# Patient Record
Sex: Male | Born: 1970 | Race: White | Hispanic: No | Smoking: Current every day smoker
Health system: Southern US, Community
[De-identification: ages and names within clinical notes are randomized; demographics above are authoritative.]

## PROBLEM LIST (undated history)

## (undated) DIAGNOSIS — F419 Anxiety disorder, unspecified: Secondary | ICD-10-CM

## (undated) DIAGNOSIS — D72821 Monocytosis (symptomatic): Secondary | ICD-10-CM

## (undated) DIAGNOSIS — F1010 Alcohol abuse, uncomplicated: Secondary | ICD-10-CM

## (undated) DIAGNOSIS — K219 Gastro-esophageal reflux disease without esophagitis: Secondary | ICD-10-CM

## (undated) DIAGNOSIS — E785 Hyperlipidemia, unspecified: Secondary | ICD-10-CM

## (undated) HISTORY — DX: Alcohol abuse, uncomplicated: F10.10

## (undated) HISTORY — DX: Hyperlipidemia, unspecified: E78.5

## (undated) HISTORY — DX: Monocytosis (symptomatic): D72.821

## (undated) HISTORY — DX: Anxiety disorder, unspecified: F41.9

---

## 1998-02-26 HISTORY — PX: SPLENECTOMY, TOTAL: SHX788

## 1998-04-27 DIAGNOSIS — K219 Gastro-esophageal reflux disease without esophagitis: Secondary | ICD-10-CM

## 2004-08-26 DIAGNOSIS — I10 Essential (primary) hypertension: Secondary | ICD-10-CM | POA: Insufficient documentation

## 2004-09-08 ENCOUNTER — Ambulatory Visit: Payer: Self-pay | Admitting: Family Medicine

## 2004-09-20 ENCOUNTER — Ambulatory Visit: Payer: Self-pay | Admitting: Family Medicine

## 2004-09-22 ENCOUNTER — Ambulatory Visit: Payer: Self-pay | Admitting: Family Medicine

## 2004-10-09 ENCOUNTER — Ambulatory Visit: Payer: Self-pay | Admitting: Family Medicine

## 2004-10-17 ENCOUNTER — Ambulatory Visit: Payer: Self-pay | Admitting: Family Medicine

## 2004-10-31 ENCOUNTER — Ambulatory Visit: Payer: Self-pay | Admitting: Family Medicine

## 2005-02-20 ENCOUNTER — Ambulatory Visit: Payer: Self-pay | Admitting: Family Medicine

## 2005-03-26 ENCOUNTER — Ambulatory Visit: Payer: Self-pay | Admitting: Family Medicine

## 2005-04-30 ENCOUNTER — Ambulatory Visit: Payer: Self-pay | Admitting: Family Medicine

## 2005-09-24 ENCOUNTER — Ambulatory Visit: Payer: Self-pay | Admitting: Family Medicine

## 2005-10-16 ENCOUNTER — Ambulatory Visit: Payer: Self-pay | Admitting: Family Medicine

## 2005-12-18 ENCOUNTER — Ambulatory Visit: Payer: Self-pay | Admitting: Family Medicine

## 2005-12-21 ENCOUNTER — Ambulatory Visit: Payer: Self-pay | Admitting: Family Medicine

## 2006-03-14 DIAGNOSIS — C4491 Basal cell carcinoma of skin, unspecified: Secondary | ICD-10-CM

## 2006-03-14 HISTORY — DX: Basal cell carcinoma of skin, unspecified: C44.91

## 2006-03-21 ENCOUNTER — Ambulatory Visit: Payer: Self-pay | Admitting: Family Medicine

## 2006-05-30 ENCOUNTER — Encounter: Payer: Self-pay | Admitting: Family Medicine

## 2006-10-08 ENCOUNTER — Telehealth (INDEPENDENT_AMBULATORY_CARE_PROVIDER_SITE_OTHER): Payer: Self-pay | Admitting: *Deleted

## 2006-11-06 ENCOUNTER — Telehealth (INDEPENDENT_AMBULATORY_CARE_PROVIDER_SITE_OTHER): Payer: Self-pay | Admitting: *Deleted

## 2006-12-02 ENCOUNTER — Telehealth (INDEPENDENT_AMBULATORY_CARE_PROVIDER_SITE_OTHER): Payer: Self-pay | Admitting: *Deleted

## 2008-04-13 ENCOUNTER — Ambulatory Visit: Payer: Self-pay | Admitting: Family Medicine

## 2008-04-13 DIAGNOSIS — M542 Cervicalgia: Secondary | ICD-10-CM | POA: Insufficient documentation

## 2008-11-16 ENCOUNTER — Ambulatory Visit: Payer: Self-pay | Admitting: Family Medicine

## 2009-01-04 ENCOUNTER — Encounter (INDEPENDENT_AMBULATORY_CARE_PROVIDER_SITE_OTHER): Payer: Self-pay | Admitting: *Deleted

## 2009-10-03 ENCOUNTER — Encounter (INDEPENDENT_AMBULATORY_CARE_PROVIDER_SITE_OTHER): Payer: Self-pay | Admitting: *Deleted

## 2010-03-28 NOTE — Letter (Signed)
Summary: Nadara Eaton letter  [facility]  [address] [place], [place] [address]   Phone: [phone]  Fax: [phone]       10/03/2009 MRN: [id number]  [facility] [address] [place], [place]  [address]  Dear Mr. [facility] [place] and [place] announce the retirement of Arta Silence, M.D., from full-time practice at the [facility] office effective August 25, 2009 and his plans of returning part-time.  It is important to Dr. Hetty Ely and to our practice that you understand that [facility] has seven physicians in our office for your health care needs.  We will continue to offer the same exceptional care that you have today.    Dr. Hetty Ely has spoken to many of you about his plans for retirement and returning part-time in the fall.   We will continue to work with you through the transition to schedule appointments for you in the office and meet the high standards that [place] is committed to.   Again, it is with great pleasure that we share the news that Dr. Hetty Ely will return to [facility] at [facility] in October of 2011 with a reduced schedule.    If you have any questions, or would like to request an appointment with one of our physicians, please call us at [phone] and press the option for Scheduling an appointment.  We take pleasure in providing you with excellent patient care and look forward to seeing you at your next office visit.  Our [facility] Physicians are:  Tillman Abide, M.D. Laurita Quint, M.D. Roxy Manns, M.D. Kerby Nora, M.D. Hannah Beat, M.D. Ruthe Mannan, M.D. We proudly welcomed Raechel Ache, M.D. and Eustaquio Boyden, M.D. to the practice in July/August 2011.  Sincerely,  [place] [facility]

## 2015-02-10 DIAGNOSIS — D229 Melanocytic nevi, unspecified: Secondary | ICD-10-CM

## 2015-02-10 HISTORY — DX: Melanocytic nevi, unspecified: D22.9

## 2015-05-17 ENCOUNTER — Telehealth: Payer: Self-pay | Admitting: Internal Medicine

## 2015-05-17 NOTE — Telephone Encounter (Signed)
Patient was set up for April 7,  Too far out. Please offer patient 11:30 slot on Thursday or 4;40 on Friday of this week.  He is referred by and is a nephew of Sung Amabile

## 2015-05-18 NOTE — Telephone Encounter (Signed)
Left message for the patient to give me a call back regarding rescheduling of his appointment to Thursday at 11:30 or Friday at 4:40.

## 2015-05-18 NOTE — Telephone Encounter (Signed)
Patient has been scheduled

## 2015-05-19 ENCOUNTER — Encounter: Payer: Self-pay | Admitting: Internal Medicine

## 2015-05-19 ENCOUNTER — Ambulatory Visit (INDEPENDENT_AMBULATORY_CARE_PROVIDER_SITE_OTHER): Payer: 59 | Admitting: Internal Medicine

## 2015-05-19 VITALS — BP 128/78 | HR 64 | Temp 98.3°F | Resp 12 | Ht 68.0 in | Wt 169.8 lb

## 2015-05-19 DIAGNOSIS — M7700 Medial epicondylitis, unspecified elbow: Secondary | ICD-10-CM | POA: Diagnosis not present

## 2015-05-19 DIAGNOSIS — F901 Attention-deficit hyperactivity disorder, predominantly hyperactive type: Secondary | ICD-10-CM | POA: Insufficient documentation

## 2015-05-19 DIAGNOSIS — M7701 Medial epicondylitis, right elbow: Secondary | ICD-10-CM | POA: Insufficient documentation

## 2015-05-19 DIAGNOSIS — F5102 Adjustment insomnia: Secondary | ICD-10-CM | POA: Diagnosis not present

## 2015-05-19 DIAGNOSIS — Z720 Tobacco use: Secondary | ICD-10-CM | POA: Insufficient documentation

## 2015-05-19 DIAGNOSIS — Z9081 Acquired absence of spleen: Secondary | ICD-10-CM | POA: Insufficient documentation

## 2015-05-19 DIAGNOSIS — Z87820 Personal history of traumatic brain injury: Secondary | ICD-10-CM | POA: Insufficient documentation

## 2015-05-19 DIAGNOSIS — Z87891 Personal history of nicotine dependence: Secondary | ICD-10-CM | POA: Diagnosis not present

## 2015-05-19 DIAGNOSIS — Z113 Encounter for screening for infections with a predominantly sexual mode of transmission: Secondary | ICD-10-CM

## 2015-05-19 DIAGNOSIS — E559 Vitamin D deficiency, unspecified: Secondary | ICD-10-CM

## 2015-05-19 DIAGNOSIS — R5383 Other fatigue: Secondary | ICD-10-CM

## 2015-05-19 DIAGNOSIS — E785 Hyperlipidemia, unspecified: Secondary | ICD-10-CM

## 2015-05-19 HISTORY — DX: Tobacco use: Z72.0

## 2015-05-19 MED ORDER — ALPRAZOLAM ER 0.5 MG PO TB24: 0.5000 mg | ORAL_TABLET | Freq: Every day | ORAL | Status: DC

## 2015-05-19 MED ORDER — VARENICLINE TARTRATE 0.5 MG X 11 & 1 MG X 42 PO MISC: ORAL | Status: DC

## 2015-05-19 MED ORDER — LIDOCAINE HCL 2 % IJ SOLN: 0.7500 mL | Freq: Once | INTRAMUSCULAR | Status: DC

## 2015-05-19 MED ORDER — TRIAMCINOLONE ACETONIDE 40 MG/ML IJ SUSP: 10.0000 mg | Freq: Once | INTRAMUSCULAR | Status: DC

## 2015-05-19 NOTE — Assessment & Plan Note (Signed)
Exam negative for signs of septic arthritis.  He has pain with forced pronation and flexion.   Informed consent for joint injection obtained.  Area was cleaned with betadine.  Medical epicondyle  was injected with 10 mg Kenalog and 0.75 ml 2% Xylocaine .  Procedure was tolerated well and .  Advised to ice area every 6 hours avoid strenuous lifting with arm for 7 days , and call if he develops signs of  infection

## 2015-05-19 NOTE — Assessment & Plan Note (Signed)
Needs Prevnar vaccine to be given at lab visit

## 2015-05-19 NOTE — Progress Notes (Signed)
Pre-visit discussion using our clinic review tool. No additional management support is needed unless otherwise documented below in the visit note.  

## 2015-05-19 NOTE — Assessment & Plan Note (Signed)
Discussed trial of chantix for maintenance of tobacco free state, he has used it before.
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
## 2015-05-19 NOTE — Patient Instructions (Signed)
[place]

## 2015-05-19 NOTE — Assessment & Plan Note (Signed)
Aggravated by tobacco cessation.  Discussed the risks and benefits of using benzodiazepines  And need to limit use to once daily.  Alprazolam ER chosen to avoid excessive sedation .The risks and benefits of benzodiazepine use were discussed with patient today including excessive sedation leading to respiratory depression,  impaired thinking/driving, and addiction.  Patient was advised to avoid concurrent use with alcohol, to use medication only as needed and not to share with others  .
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
## 2015-05-25 ENCOUNTER — Other Ambulatory Visit (INDEPENDENT_AMBULATORY_CARE_PROVIDER_SITE_OTHER): Payer: 59

## 2015-05-25 DIAGNOSIS — R5383 Other fatigue: Secondary | ICD-10-CM

## 2015-05-25 DIAGNOSIS — E785 Hyperlipidemia, unspecified: Secondary | ICD-10-CM | POA: Diagnosis not present

## 2015-05-25 DIAGNOSIS — E559 Vitamin D deficiency, unspecified: Secondary | ICD-10-CM

## 2015-05-25 DIAGNOSIS — Z113 Encounter for screening for infections with a predominantly sexual mode of transmission: Secondary | ICD-10-CM

## 2015-05-25 LAB — COMPREHENSIVE METABOLIC PANEL
ALT: 19 U/L (ref 0–53)
AST: 19 U/L (ref 0–37)
Albumin: 4.2 g/dL (ref 3.5–5.2)
Alkaline Phosphatase: 59 U/L (ref 39–117)
BUN: 15 mg/dL (ref 6–23)
CHLORIDE: 104 meq/L (ref 96–112)
CO2: 26 meq/L (ref 19–32)
CREATININE: 0.91 mg/dL (ref 0.40–1.50)
Calcium: 9.4 mg/dL (ref 8.4–10.5)
GFR: 95.32 mL/min (ref >60.00)
GLUCOSE: 101 mg/dL — AB (ref 70–99)
POTASSIUM: 4.1 meq/L (ref 3.5–5.1)
SODIUM: 139 meq/L (ref 135–145)
Total Bilirubin: 0.8 mg/dL (ref 0.2–1.2)
Total Protein: 6.9 g/dL (ref 6.0–8.3)

## 2015-05-25 LAB — LIPID PANEL
CHOL/HDL RATIO: 7
Cholesterol: 230 mg/dL — ABNORMAL HIGH (ref 0–200)
HDL: 35.3 mg/dL — AB (ref >39.00)
LDL Cholesterol: 157 mg/dL — ABNORMAL HIGH (ref 0–99)
NONHDL: 194.25
TRIGLYCERIDES: 187 mg/dL — AB (ref 0.0–149.0)
VLDL: 37.4 mg/dL (ref 0.0–40.0)

## 2015-05-25 LAB — CBC WITH DIFFERENTIAL/PLATELET
BASOS ABS: 0.1 10*3/uL (ref 0.0–0.1)
Basophils Relative: 0.6 % (ref 0.0–3.0)
EOS ABS: 0.2 10*3/uL (ref 0.0–0.7)
EOS PCT: 1.2 % (ref 0.0–5.0)
HEMATOCRIT: 49 % (ref 39.0–52.0)
Hemoglobin: 16.7 g/dL (ref 13.0–17.0)
LYMPHS PCT: 21.9 % (ref 12.0–46.0)
Lymphs Abs: 3.1 10*3/uL (ref 0.7–4.0)
MCHC: 34.1 g/dL (ref 30.0–36.0)
MCV: 100.8 fl — AB (ref 78.0–100.0)
MONOS PCT: 7.5 % (ref 3.0–12.0)
Monocytes Absolute: 1.1 10*3/uL — ABNORMAL HIGH (ref 0.1–1.0)
NEUTROS ABS: 9.7 10*3/uL — AB (ref 1.4–7.7)
Neutrophils Relative %: 68.8 % (ref 43.0–77.0)
PLATELETS: 404 10*3/uL — AB (ref 150.0–400.0)
RBC: 4.86 Mil/uL (ref 4.22–5.81)
RDW: 13.4 % (ref 11.5–15.5)
WBC: 14.1 10*3/uL — ABNORMAL HIGH (ref 4.0–10.5)

## 2015-05-25 LAB — TSH: TSH: 1.56 u[IU]/mL (ref 0.35–4.50)

## 2015-05-25 LAB — VITAMIN D 25 HYDROXY (VIT D DEFICIENCY, FRACTURES): VITD: 26.02 ng/mL — ABNORMAL LOW (ref 30.00–100.00)

## 2015-05-25 LAB — LDL CHOLESTEROL, DIRECT: Direct LDL: 155 mg/dL

## 2015-05-26 LAB — HIV ANTIBODY (ROUTINE TESTING W REFLEX): HIV: NONREACTIVE

## 2015-05-26 LAB — HEPATITIS C ANTIBODY: HCV AB: NEGATIVE

## 2015-05-27 ENCOUNTER — Encounter: Payer: Self-pay | Admitting: Internal Medicine

## 2015-05-27 ENCOUNTER — Other Ambulatory Visit: Payer: Self-pay | Admitting: Internal Medicine

## 2015-05-27 DIAGNOSIS — D7589 Other specified diseases of blood and blood-forming organs: Secondary | ICD-10-CM

## 2015-06-03 ENCOUNTER — Ambulatory Visit: Payer: 59 | Admitting: Internal Medicine

## 2015-06-29 ENCOUNTER — Other Ambulatory Visit: Payer: Self-pay | Admitting: Internal Medicine

## 2015-06-29 ENCOUNTER — Encounter: Payer: Self-pay | Admitting: Nurse Practitioner

## 2015-06-29 ENCOUNTER — Ambulatory Visit (INDEPENDENT_AMBULATORY_CARE_PROVIDER_SITE_OTHER): Payer: 59 | Admitting: Nurse Practitioner

## 2015-06-29 VITALS — BP 122/82 | HR 70 | Temp 98.2°F | Ht 68.0 in | Wt 174.0 lb

## 2015-06-29 DIAGNOSIS — M7701 Medial epicondylitis, right elbow: Secondary | ICD-10-CM

## 2015-06-29 DIAGNOSIS — M546 Pain in thoracic spine: Secondary | ICD-10-CM | POA: Diagnosis not present

## 2015-06-29 MED ORDER — CYCLOBENZAPRINE HCL 5 MG PO TABS: 5.0000 mg | ORAL_TABLET | Freq: Every day | ORAL | Status: DC

## 2015-06-29 MED ORDER — METHYLPREDNISOLONE ACETATE 80 MG/ML IJ SUSP
80.0000 mg | Freq: Once | INTRAMUSCULAR | Status: AC
  Administered 2015-06-29: 80 mg via INTRAMUSCULAR

## 2015-06-29 NOTE — Patient Instructions (Addendum)
Stretch, heat, muscle relaxer at night. Flexeril is the muscle relaxer sent to your pharmacy.   Depo-medrol will work over next 24 hrs.

## 2015-06-29 NOTE — Progress Notes (Signed)
Patient ID: Samuel Riley, male    DOB: 1970/06/04  Age: 45 y.o. MRN: [id number]  CC: Back Pain   HPI Samuel Riley presents for CC of thoracic back pain x 3-4 weeks.   Onset- 3-4 weeks ago  Location- between shoulders upper back  Duration- constant  Characteristics- aching/dull  Aggravating factors- morning when waking up, driving in cars Relieving factors- Lying flat helpful  Severity- 5/10   Back massager not helpful   Trauma- past TBI  No past concerns with depo-medrol use he reports.   History Samuel Riley has no past medical history on file.   He has past surgical history that includes Cholecystectomy.   His family history includes Hypertension in his brother and father.He reports that he has never smoked. He does not have any smokeless tobacco history on file. He reports that he drinks alcohol. He reports that he does not use illicit drugs.  Outpatient Prescriptions Prior to Visit  Medication Sig Dispense Refill  . ALPRAZolam (XANAX XR) 0.5 MG 24 hr tablet Take 1 tablet (0.5 mg total) by mouth daily. 30 tablet 3  . finasteride (PROSCAR) 5 MG tablet   6  . omeprazole (PRILOSEC) 20 MG capsule Take 20 mg by mouth daily.    . varenicline (CHANTIX STARTING MONTH PAK) 0.5 MG X 11 & 1 MG X 42 tablet Take one 0.5 mg tablet by mouth once daily for 3 days, then increase to one 0.5 mg tablet twice daily for 4 days, then increase to one 1 mg tablet twice daily. 53 tablet 0   Facility-Administered Medications Prior to Visit  Medication Dose Route Frequency Provider Last Rate Last Dose  . lidocaine (XYLOCAINE) 2 % (with pres) injection 15 mg  0.75 mL Other Once Crecencio Mc, MD        ROS Review of Systems  Constitutional: Negative for fever, chills, diaphoresis and fatigue.  Eyes: Negative for visual disturbance.  Respiratory: Negative for chest tightness, shortness of breath and wheezing.   Cardiovascular: Negative for chest pain, palpitations and leg swelling.    Gastrointestinal: Negative for nausea, vomiting and diarrhea.  Musculoskeletal: Positive for back pain. Negative for myalgias, joint swelling, arthralgias, gait problem, neck pain and neck stiffness.  Skin: Negative for rash.  Neurological: Negative for dizziness, weakness and numbness.  Psychiatric/Behavioral: The patient is not nervous/anxious.     Objective:  BP 122/82 mmHg  Pulse 70  Temp(Src) 98.2 F (36.8 C) (Oral)  Ht 5\' 8"  (1.727 m)  Wt 174 lb (78.926 kg)  BMI 26.46 kg/m2  SpO2 96%  Physical Exam  Constitutional: He is oriented to person, place, and time. He appears well-developed and well-nourished. No distress.  HENT:  Head: Normocephalic and atraumatic.  Right Ear: External ear normal.  Left Ear: External ear normal.  Cardiovascular: Normal rate, regular rhythm and normal heart sounds.   Pulmonary/Chest: Effort normal and breath sounds normal. No respiratory distress. He has no wheezes. He has no rales. He exhibits no tenderness.  Musculoskeletal: Normal range of motion. He exhibits tenderness. He exhibits no edema.       Arms: Tenderness at this area (red dot) only   Neurological: He is alert and oriented to person, place, and time.  Skin: Skin is warm and dry. No rash noted. He is not diaphoretic.  Psychiatric: He has a normal mood and affect. His behavior is normal. Judgment and thought content normal.   Assessment & Plan:   Samuel Riley was seen today for back pain.  Diagnoses and  all orders for this visit:  Midline thoracic back pain -     methylPREDNISolone acetate (DEPO-MEDROL) injection 80 mg; Inject 1 mL (80 mg total) into the muscle once.  Other orders -     cyclobenzaprine (FLEXERIL) 5 MG tablet; Take 1 tablet (5 mg total) by mouth at bedtime.  I have discontinued Samuel Riley varenicline. I am also having him start on cyclobenzaprine. Additionally, I am having him maintain his finasteride, omeprazole, and ALPRAZolam. We administered  methylPREDNISolone acetate. We will continue to administer lidocaine.  Meds ordered this encounter  Medications  . methylPREDNISolone acetate (DEPO-MEDROL) injection 80 mg    Sig:   . cyclobenzaprine (FLEXERIL) 5 MG tablet    Sig: Take 1 tablet (5 mg total) by mouth at bedtime.    Dispense:  30 tablet    Refill:  0    Order Specific Question:  Supervising Provider    Answer:  Crecencio Mc [[id number]]     Follow-up: Return if symptoms worsen or fail to improve.

## 2015-06-29 NOTE — Assessment & Plan Note (Signed)
New onset After treatment discussion we decided upon Depo-Medrol 80 mg IM once-patient tolerated well. Has tolerated steroid injections in the past.  Flexeril given for nighttime relief Conservative measures otherwise patient declines to see physical therapy or have x-ray done.

## 2015-06-29 NOTE — Progress Notes (Signed)
Pre visit review using our clinic review tool, if applicable. No additional management support is needed unless otherwise documented below in the visit note. 

## 2015-07-13 ENCOUNTER — Other Ambulatory Visit: Payer: Self-pay | Admitting: Internal Medicine

## 2015-07-27 ENCOUNTER — Telehealth: Payer: Self-pay | Admitting: *Deleted

## 2015-07-27 MED ORDER — CYCLOBENZAPRINE HCL 5 MG PO TABS: 5.0000 mg | ORAL_TABLET | Freq: Every day | ORAL | Status: DC

## 2015-07-27 NOTE — Telephone Encounter (Signed)
filled

## 2015-07-27 NOTE — Telephone Encounter (Signed)
Refill request  [facility] [place]  [id number] [phone] fax  cyclobenzaprine (FLEXERIL) 5 MG tablet  Last filled: 06/29/15 Last office visit: 06/29/15 Lorane Gell NP No future appointment scheduled   Okay to fill?

## 2015-08-11 ENCOUNTER — Other Ambulatory Visit: Payer: Self-pay | Admitting: Internal Medicine

## 2015-08-11 ENCOUNTER — Telehealth: Payer: Self-pay | Admitting: *Deleted

## 2015-08-11 NOTE — Telephone Encounter (Signed)
Patient has requested a medication refill for alprazolam. Patient will be going out of the town for 10 days, while he's out of town his Rx will end,this will cause him to be without this medication for 4 days.  He requested to have a early refill, to avoid missed days of this medication.  [facility] [address]

## 2015-08-11 NOTE — Telephone Encounter (Signed)
Last refilled on 3/23, will need refill when her goes out of town, would like to pick up early.

## 2015-08-12 MED ORDER — ALPRAZOLAM ER 0.5 MG PO TB24: 0.5000 mg | ORAL_TABLET | Freq: Every day | ORAL | Status: DC

## 2015-08-12 NOTE — Telephone Encounter (Signed)
You may refill the alprazolam early.  Please call it in since I am out of the office today

## 2015-08-12 NOTE — Telephone Encounter (Signed)
Pt called about his Rx. I spoke with Lavella Lemons about the Rx. I advised pt to go to pharmacy in [place] and have Rx transferred over from his pharmacy.

## 2015-08-12 NOTE — Telephone Encounter (Signed)
Script called to pharmacy

## 2015-09-20 ENCOUNTER — Other Ambulatory Visit: Payer: Self-pay | Admitting: Internal Medicine

## 2015-09-20 DIAGNOSIS — G8929 Other chronic pain: Secondary | ICD-10-CM

## 2015-09-20 DIAGNOSIS — M79644 Pain in right finger(s): Secondary | ICD-10-CM

## 2015-09-20 DIAGNOSIS — M25521 Pain in right elbow: Principal | ICD-10-CM

## 2015-09-22 ENCOUNTER — Ambulatory Visit (INDEPENDENT_AMBULATORY_CARE_PROVIDER_SITE_OTHER): Payer: 59

## 2015-09-22 ENCOUNTER — Other Ambulatory Visit (INDEPENDENT_AMBULATORY_CARE_PROVIDER_SITE_OTHER): Payer: 59

## 2015-09-22 DIAGNOSIS — G8929 Other chronic pain: Secondary | ICD-10-CM | POA: Diagnosis not present

## 2015-09-22 DIAGNOSIS — D7589 Other specified diseases of blood and blood-forming organs: Secondary | ICD-10-CM

## 2015-09-22 DIAGNOSIS — M79644 Pain in right finger(s): Secondary | ICD-10-CM | POA: Diagnosis not present

## 2015-09-22 DIAGNOSIS — M25521 Pain in right elbow: Secondary | ICD-10-CM | POA: Diagnosis not present

## 2015-09-23 LAB — CBC WITH DIFFERENTIAL/PLATELET
BASOS PCT: 1 % (ref 0.0–3.0)
Basophils Absolute: 0.1 10*3/uL (ref 0.0–0.1)
EOS ABS: 0.4 10*3/uL (ref 0.0–0.7)
Eosinophils Relative: 2.9 % (ref 0.0–5.0)
HEMATOCRIT: 46 % (ref 39.0–52.0)
HEMOGLOBIN: 16.1 g/dL (ref 13.0–17.0)
LYMPHS PCT: 28.8 % (ref 12.0–46.0)
Lymphs Abs: 3.7 10*3/uL (ref 0.7–4.0)
MCHC: 34.9 g/dL (ref 30.0–36.0)
MCV: 100.9 fl — ABNORMAL HIGH (ref 78.0–100.0)
MONOS PCT: 7 % (ref 3.0–12.0)
Monocytes Absolute: 0.9 10*3/uL (ref 0.1–1.0)
Neutro Abs: 7.7 10*3/uL (ref 1.4–7.7)
Neutrophils Relative %: 60.3 % (ref 43.0–77.0)
Platelets: 385 10*3/uL (ref 150.0–400.0)
RBC: 4.56 Mil/uL (ref 4.22–5.81)
RDW: 13.7 % (ref 11.5–15.5)
WBC: 12.7 10*3/uL — AB (ref 4.0–10.5)

## 2015-09-23 LAB — VITAMIN B12: Vitamin B-12: 237 pg/mL (ref 211–911)

## 2015-09-23 LAB — FOLATE RBC: RBC Folate: 354 ng/mL (ref >280)

## 2015-09-25 ENCOUNTER — Encounter: Payer: Self-pay | Admitting: Internal Medicine

## 2015-09-25 DIAGNOSIS — G8929 Other chronic pain: Secondary | ICD-10-CM

## 2015-09-25 DIAGNOSIS — M25521 Pain in right elbow: Principal | ICD-10-CM

## 2015-09-25 LAB — METHYLMALONIC ACID, SERUM: Methylmalonic Acid, Quant: 124 nmol/L (ref 87–318)

## 2015-10-01 ENCOUNTER — Other Ambulatory Visit: Payer: Self-pay | Admitting: Internal Medicine

## 2015-10-03 NOTE — Telephone Encounter (Signed)
Okay to refill or dose this need to be switched to the continuing pack?

## 2015-10-04 NOTE — Telephone Encounter (Signed)
Not sure,  Text message  sent to ask patient if he is starting over or continuining.

## 2015-10-04 NOTE — Telephone Encounter (Signed)
Good.

## 2015-10-06 NOTE — Telephone Encounter (Signed)
Pt states that he no longer needs this medication

## 2015-12-23 ENCOUNTER — Other Ambulatory Visit: Payer: Self-pay | Admitting: Internal Medicine

## 2015-12-23 NOTE — Telephone Encounter (Signed)
REFILLED,

## 2015-12-23 NOTE — Telephone Encounter (Signed)
Last OV 2/17 ok to refill cannot print from lab?

## 2016-03-01 ENCOUNTER — Other Ambulatory Visit: Payer: Self-pay | Admitting: Internal Medicine

## 2016-03-05 NOTE — Telephone Encounter (Signed)
Last OV 05-19-15

## 2016-03-05 NOTE — Telephone Encounter (Signed)
Med refilled, please call patient and have ov scheduled

## 2016-05-02 ENCOUNTER — Telehealth: Payer: Self-pay

## 2016-05-02 ENCOUNTER — Other Ambulatory Visit: Payer: Self-pay | Admitting: Internal Medicine

## 2016-05-02 DIAGNOSIS — D7589 Other specified diseases of blood and blood-forming organs: Secondary | ICD-10-CM

## 2016-05-02 DIAGNOSIS — D72829 Elevated white blood cell count, unspecified: Secondary | ICD-10-CM

## 2016-05-02 DIAGNOSIS — E7800 Pure hypercholesterolemia, unspecified: Secondary | ICD-10-CM

## 2016-05-02 NOTE — Telephone Encounter (Signed)
Refilled on 03/05/2016 Last Ov: 05/19/2015 Next Ov: not scheduled.   Please advise.

## 2016-05-02 NOTE — Telephone Encounter (Signed)
Spoke with pt and scheduled an office visit as well as a lab visit. Sent message to Lorriane Shire to schedule the appt in Dr. Lupita Dawn March 26th slot at 9:45am.

## 2016-05-02 NOTE — Telephone Encounter (Signed)
Please notify patient that the prescription  was Refilled for 30 days but that I would like him to schedule an office visit during that time since controlled substances are involved.  He should have fasting labs done prior to the visit

## 2016-05-02 NOTE — Telephone Encounter (Signed)
Can you possibly schedule this pt in Dr. Lupita Dawn March 23rd @ 9:15am slot. Dr. Derrel Nip stated that she would like for the pt to be seen in the next 30 days and the 11:30 and 4:30 slots was all I could find. Thank You

## 2016-05-16 ENCOUNTER — Other Ambulatory Visit: Payer: 59

## 2016-05-18 ENCOUNTER — Other Ambulatory Visit (INDEPENDENT_AMBULATORY_CARE_PROVIDER_SITE_OTHER): Payer: 59

## 2016-05-18 DIAGNOSIS — E7800 Pure hypercholesterolemia, unspecified: Secondary | ICD-10-CM | POA: Diagnosis not present

## 2016-05-18 DIAGNOSIS — D7589 Other specified diseases of blood and blood-forming organs: Secondary | ICD-10-CM | POA: Diagnosis not present

## 2016-05-18 DIAGNOSIS — D72829 Elevated white blood cell count, unspecified: Secondary | ICD-10-CM

## 2016-05-18 LAB — COMPREHENSIVE METABOLIC PANEL
ALBUMIN: 4.2 g/dL (ref 3.5–5.2)
ALT: 16 U/L (ref 0–53)
AST: 14 U/L (ref 0–37)
Alkaline Phosphatase: 58 U/L (ref 39–117)
BUN: 12 mg/dL (ref 6–23)
CHLORIDE: 102 meq/L (ref 96–112)
CO2: 31 mEq/L (ref 19–32)
CREATININE: 0.92 mg/dL (ref 0.40–1.50)
Calcium: 9.5 mg/dL (ref 8.4–10.5)
GFR: 93.72 mL/min (ref >60.00)
Glucose, Bld: 106 mg/dL — ABNORMAL HIGH (ref 70–99)
POTASSIUM: 4.8 meq/L (ref 3.5–5.1)
SODIUM: 138 meq/L (ref 135–145)
Total Bilirubin: 0.8 mg/dL (ref 0.2–1.2)
Total Protein: 6.7 g/dL (ref 6.0–8.3)

## 2016-05-18 LAB — CBC WITH DIFFERENTIAL/PLATELET
BASOS PCT: 1.2 % (ref 0.0–3.0)
Basophils Absolute: 0.2 10*3/uL — ABNORMAL HIGH (ref 0.0–0.1)
EOS ABS: 0.6 10*3/uL (ref 0.0–0.7)
EOS PCT: 4.2 % (ref 0.0–5.0)
HCT: 48.4 % (ref 39.0–52.0)
Hemoglobin: 16.5 g/dL (ref 13.0–17.0)
Lymphocytes Relative: 15.1 % (ref 12.0–46.0)
Lymphs Abs: 2.2 10*3/uL (ref 0.7–4.0)
MCHC: 34.1 g/dL (ref 30.0–36.0)
MCV: 101.2 fl — ABNORMAL HIGH (ref 78.0–100.0)
MONO ABS: 2.2 10*3/uL — AB (ref 0.1–1.0)
Monocytes Relative: 15.4 % — ABNORMAL HIGH (ref 3.0–12.0)
NEUTROS ABS: 9.2 10*3/uL — AB (ref 1.4–7.7)
NEUTROS PCT: 64.1 % (ref 43.0–77.0)
PLATELETS: 390 10*3/uL (ref 150.0–400.0)
RBC: 4.78 Mil/uL (ref 4.22–5.81)
RDW: 13.3 % (ref 11.5–15.5)
WBC: 14.3 10*3/uL — AB (ref 4.0–10.5)

## 2016-05-18 LAB — LIPID PANEL
CHOLESTEROL: 201 mg/dL — AB (ref 0–200)
HDL: 32.9 mg/dL — ABNORMAL LOW (ref >39.00)
LDL CALC: 148 mg/dL — AB (ref 0–99)
NonHDL: 168.59
TRIGLYCERIDES: 102 mg/dL (ref 0.0–149.0)
Total CHOL/HDL Ratio: 6
VLDL: 20.4 mg/dL (ref 0.0–40.0)

## 2016-05-18 LAB — VITAMIN B12: VITAMIN B 12: 238 pg/mL (ref 211–911)

## 2016-05-21 ENCOUNTER — Ambulatory Visit (INDEPENDENT_AMBULATORY_CARE_PROVIDER_SITE_OTHER): Payer: 59 | Admitting: Internal Medicine

## 2016-05-21 ENCOUNTER — Other Ambulatory Visit (HOSPITAL_COMMUNITY)
Admission: RE | Admit: 2016-05-21 | Discharge: 2016-05-21 | Disposition: A | Discharge status: Home / Self Care | Payer: 59 | Source: Ambulatory Visit | Attending: Internal Medicine | Admitting: Internal Medicine

## 2016-05-21 ENCOUNTER — Encounter: Payer: Self-pay | Admitting: Internal Medicine

## 2016-05-21 VITALS — BP 122/90 | HR 76 | Temp 98.1°F | Resp 17 | Ht 68.0 in | Wt 171.6 lb

## 2016-05-21 DIAGNOSIS — F520 Hypoactive sexual desire disorder: Secondary | ICD-10-CM

## 2016-05-21 DIAGNOSIS — Z0000 Encounter for general adult medical examination without abnormal findings: Secondary | ICD-10-CM | POA: Diagnosis not present

## 2016-05-21 DIAGNOSIS — R7301 Impaired fasting glucose: Secondary | ICD-10-CM

## 2016-05-21 DIAGNOSIS — E538 Deficiency of other specified B group vitamins: Secondary | ICD-10-CM | POA: Insufficient documentation

## 2016-05-21 DIAGNOSIS — Z113 Encounter for screening for infections with a predominantly sexual mode of transmission: Secondary | ICD-10-CM

## 2016-05-21 DIAGNOSIS — Z122 Encounter for screening for malignant neoplasm of respiratory organs: Secondary | ICD-10-CM | POA: Diagnosis not present

## 2016-05-21 DIAGNOSIS — F901 Attention-deficit hyperactivity disorder, predominantly hyperactive type: Secondary | ICD-10-CM

## 2016-05-21 MED ORDER — CYANOCOBALAMIN 1000 MCG/ML IJ SOLN
1000.0000 ug | Freq: Once | INTRAMUSCULAR | Status: AC
  Administered 2016-05-21: 1000 ug via INTRAMUSCULAR

## 2016-05-21 MED ORDER — ALPRAZOLAM ER 0.5 MG PO TB24: 0.5000 mg | ORAL_TABLET | Freq: Every day | ORAL | 5 refills | Status: DC

## 2016-05-21 NOTE — Progress Notes (Signed)
Pre visit review using our clinic review tool, if applicable. No additional management support is needed unless otherwise documented below in the visit note. 

## 2016-05-21 NOTE — Patient Instructions (Signed)
Nice work on the cholesterol and tobacco abuse  I am treating your for b12 deficiency based on your labs. You need  3 weekly injections,,  Then monthly for 3,  We can switch to oral after 6 months  If Lelon Frohlich is willing to give you you next injection I'll send the rx to your pharmacy.  Otherwise,  Kennyth Lose at [facility] can do it.   .   I am screening for you prediabetes today with an A1c

## 2016-05-21 NOTE — Progress Notes (Signed)
Patient ID: Samuel Riley, male    DOB: 05/03/1970  Age: 46 y.o. MRN: [id number]  The patient is here for annual wellness examination and management of other chronic and acute problems.   The risk factors are reflected in the social history.  He has quit smoking   The roster of all physicians providing medical care to patient - is listed in the Snapshot section of the chart.  Activities of daily living:  The patient is 100% independent in all ADLs: dressing, toileting, feeding as well as independent mobility  Home safety : The patient has smoke detectors in the home. They wear seatbelts.  There are secured firearms at home. There is no violence in the home.   There is no risks for hepatitis, STDs or HIV. There is no   history of blood transfusion. They have no travel history to infectious disease endemic areas of the world.  The patient has seen their dentist in the last six month. They have seen their eye doctor in the last year. They admit to slight hearing difficulty with regard to whispered voices and some television programs.  They have deferred audiologic testing in the last year.  They do not  have excessive sun exposure. Discussed the need for sun protection: hats, long sleeves and use of sunscreen if there is significant sun exposure.   Diet: the importance of a healthy diet is discussed. They do have a healthy diet.  The benefits of regular aerobic exercise were discussed. He exercises 3 times per week ,  26 minutes.   Depression screen: there are no signs or vegative symptoms of depression- irritability, change in appetite, anhedonia, sadness/tearfullness.  Cognitive assessment: the patient manages all their financial and personal affairs and is actively engaged.   The following portions of the patient's history were reviewed and updated as appropriate: allergies, current medications, past family history, past medical history,  past surgical history, past social history  and  problem list.  Visual acuity was not assessed per patient preference since she has regular follow up with her ophthalmologist. Hearing and body mass index were assessed and reviewed.   During the course of the visit the patient was educated and counseled about appropriate screening and preventive services including : fall prevention , diabetes screening, nutrition counseling, colorectal cancer screening, and recommended immunizations.    CC: The primary encounter diagnosis was Vitamin B12 deficiency. Diagnoses of Encounter for screening for lung cancer, Screen for STD (sexually transmitted disease), Lack of libido, Impaired fasting glucose, B12 deficiency, Hyperactivity disorder, predominantly hyperactive-impulsive type, and Encounter for preventive health examination were also pertinent to this visit.  History Samuel Riley has no past medical history on file.   He has a past surgical history that includes Cholecystectomy.   His family history includes Coronary artery disease in his sister; Hypertension in his brother and father.He reports that he quit smoking about 7 weeks ago. His smoking use included Cigarettes. He smoked 0.50 packs per day. He has never used smokeless tobacco. He reports that he does not drink alcohol or use drugs.  Outpatient Medications Prior to Visit  Medication Sig Dispense Refill  . cyclobenzaprine (FLEXERIL) 5 MG tablet Take 1 tablet (5 mg total) by mouth at bedtime. 30 tablet 5  . finasteride (PROSCAR) 5 MG tablet   6  . omeprazole (PRILOSEC) 20 MG capsule Take 20 mg by mouth daily.    Marland Kitchen ALPRAZolam (XANAX XR) 0.5 MG 24 hr tablet TAKE 1 TABLET EVERY DAY 30 tablet 0  .  CHANTIX STARTING MONTH PAK 0.5 MG X 11 & 1 MG X 42 tablet TAKE AS DIRECTED INSIDE OF PACKAGE (Patient not taking: Reported on 05/21/2016) 53 tablet 0   Facility-Administered Medications Prior to Visit  Medication Dose Route Frequency Provider Last Rate Last Dose  . lidocaine (XYLOCAINE) 2 % (with pres)  injection 15 mg  0.75 mL Other Once Samuel Mc, MD        Review of Systems   Patient denies headache, fevers, malaise, unintentional weight loss, skin rash, eye pain, sinus congestion and sinus pain, sore throat, dysphagia,  hemoptysis , cough, dyspnea, wheezing, chest pain, palpitations, orthopnea, edema, abdominal pain, nausea, melena, diarrhea, constipation, flank pain, dysuria, hematuria, urinary  Frequency, nocturia, numbness, tingling, seizures,  Focal weakness, Loss of consciousness,  Tremor, insomnia, depression, anxiety, and suicidal ideation.      Objective:  BP 122/90 (BP Location: Left Arm, Patient Position: Sitting, Cuff Size: Normal)   Pulse 76   Temp 98.1 F (36.7 C) (Oral)   Resp 17   Ht 5\' 8"  (1.727 m)   Wt 171 lb 9.6 oz (77.8 kg)   SpO2 97%   BMI 26.09 kg/m   Physical Exam   General appearance: alert, cooperative and appears stated age Ears: normal TM's and external ear canals both ears Throat: lips, mucosa, and tongue normal; teeth and gums normal Neck: no adenopathy, no carotid bruit, supple, symmetrical, trachea midline and thyroid not enlarged, symmetric, no tenderness/mass/nodules Back: symmetric, no curvature. ROM normal. No CVA tenderness. Lungs: clear to auscultation bilaterally Heart: regular rate and rhythm, S1, S2 normal, no murmur, click, rub or gallop Abdomen: soft, non-tender; bowel sounds normal; no masses,  no organomegaly Pulses: 2+ and symmetric Skin: Skin color, texture, turgor normal. No rashes or lesions Lymph nodes: Cervical, supraclavicular, and axillary nodes normal.    Assessment & Plan:   Problem List Items Addressed This Visit    B12 deficiency    Discovered during screening for anemia.  He has a macrocytosis and mild leukocytosis without anemia.  B12 injection given today...  Will continue weekly injections x 3 at [facility],  folllowed by monthly injections thereafter      Encounter for preventive health examination     Annual comprehensive preventive exam was done as well as an evaluation and management of acute and chronic conditions .  During the course of the visit the patient was educated and counseled about appropriate screening and preventive services including :  diabetes screening, lipid analysis with projected  10 year  risk for CAD , nutrition counseling, std screening, and recommended immunizations.  Printed recommendations for health maintenance screenings was given.   Lab Results  Component Value Date   CHOL 201 (H) 05/18/2016   HDL 32.90 (L) 05/18/2016   LDLCALC 148 (H) 05/18/2016   LDLDIRECT 155.0 05/25/2015   TRIG 102.0 05/18/2016   CHOLHDL 6 05/18/2016   Lab Results  Component Value Date   CREATININE 0.92 05/18/2016   Lab Results  Component Value Date   HGBA1C 5.5 05/21/2016         Encounter for screening for lung cancer    Lung ca screening ct ordered       Relevant Orders   CT CHEST LUNG CA SCREEN LOW DOSE W/O CM   Hyperactivity disorder, predominantly hyperactive-impulsive type    Aggravated by tobacco cessation.  Discussed the risks and benefits of using benzodiazepines  And need to limit use to once daily.  Alprazolam ER 0.5 mg daily  has been helpful and there has been no dose escalation .The risks and benefits of benzodiazepine use were reviewed with patient today including excessive sedation leading to respiratory depression,  impaired thinking/driving, and addiction.  Patient was advised to avoid concurrent use with alcohol, to use medication only as needed and not to share with others  .       Screen for STD (sexually transmitted disease)    He has had unprotected heterosexual intercourse and requests std screening.        Relevant Orders   HIV antibody   Hepatitis C antibody   GC/chlamydia probe amp, urine    Other Visit Diagnoses    Vitamin B12 deficiency    -  Primary   Relevant Medications   cyanocobalamin ((VITAMIN B-12)) injection 1,000 mcg (Completed)    Lack of libido       Relevant Orders   Testos,Total,Free and SHBG (Male)   Impaired fasting glucose       Relevant Orders   Hemoglobin A1c (Completed)      I have discontinued Mr. Hoglund CHANTIX STARTING MONTH PAK. I have also changed his ALPRAZolam. Additionally, I am having him start on cyanocobalamin. Lastly, I am having him maintain his finasteride, omeprazole, and cyclobenzaprine. We administered cyanocobalamin. We will continue to administer lidocaine.  Meds ordered this encounter  Medications  . ALPRAZolam (XANAX XR) 0.5 MG 24 hr tablet    Sig: Take 1 tablet (0.5 mg total) by mouth daily.    Dispense:  30 tablet    Refill:  5    KEEP ON FILE FOR FUTURE REFILLS  . cyanocobalamin ((VITAMIN B-12)) injection 1,000 mcg  . cyanocobalamin (,VITAMIN B-12,) 1000 MCG/ML injection    Sig: 1 ml injected IM weekly x 3 starting April 2, then monthly by [facility]    Dispense:  10 mL    Refill:  2    Medications Discontinued During This Encounter  Medication Reason  . CHANTIX STARTING MONTH PAK 0.5 MG X 11 & 1 MG X 42 tablet Therapy completed  . ALPRAZolam (XANAX XR) 0.5 MG 24 hr tablet Reorder    Follow-up: No Follow-up on file.   Samuel Mc, MD

## 2016-05-22 ENCOUNTER — Encounter: Payer: Self-pay | Admitting: Internal Medicine

## 2016-05-22 DIAGNOSIS — Z122 Encounter for screening for malignant neoplasm of respiratory organs: Secondary | ICD-10-CM | POA: Insufficient documentation

## 2016-05-22 DIAGNOSIS — Z0000 Encounter for general adult medical examination without abnormal findings: Secondary | ICD-10-CM | POA: Insufficient documentation

## 2016-05-22 DIAGNOSIS — Z113 Encounter for screening for infections with a predominantly sexual mode of transmission: Secondary | ICD-10-CM | POA: Insufficient documentation

## 2016-05-22 LAB — HIV ANTIBODY (ROUTINE TESTING W REFLEX): HIV: NONREACTIVE

## 2016-05-22 LAB — HEPATITIS C ANTIBODY: HCV Ab: NEGATIVE

## 2016-05-22 LAB — HEMOGLOBIN A1C: Hgb A1c MFr Bld: 5.5 % (ref 4.6–6.5)

## 2016-05-22 MED ORDER — CYANOCOBALAMIN 1000 MCG/ML IJ SOLN: INTRAMUSCULAR | 2 refills | Status: DC

## 2016-05-22 NOTE — Assessment & Plan Note (Signed)
He has had unprotected heterosexual intercourse and requests std screening.

## 2016-05-22 NOTE — Assessment & Plan Note (Signed)
Aggravated by tobacco cessation.  Discussed the risks and benefits of using benzodiazepines  And need to limit use to once daily.  Alprazolam ER 0.5 mg daily has been helpful and there has been no dose escalation .The risks and benefits of benzodiazepine use were reviewed with patient today including excessive sedation leading to respiratory depression,  impaired thinking/driving, and addiction.  Patient was advised to avoid concurrent use with alcohol, to use medication only as needed and not to share with others  .

## 2016-05-22 NOTE — Assessment & Plan Note (Signed)
Lung ca screening ct ordered

## 2016-05-22 NOTE — Assessment & Plan Note (Signed)
Annual comprehensive preventive exam was done as well as an evaluation and management of acute and chronic conditions .  During the course of the visit the patient was educated and counseled about appropriate screening and preventive services including :  diabetes screening, lipid analysis with projected  10 year  risk for CAD , nutrition counseling, std screening, and recommended immunizations.  Printed recommendations for health maintenance screenings was given.   Lab Results  Component Value Date   CHOL 201 (H) 05/18/2016   HDL 32.90 (L) 05/18/2016   LDLCALC 148 (H) 05/18/2016   LDLDIRECT 155.0 05/25/2015   TRIG 102.0 05/18/2016   CHOLHDL 6 05/18/2016   Lab Results  Component Value Date   CREATININE 0.92 05/18/2016   Lab Results  Component Value Date   HGBA1C 5.5 05/21/2016

## 2016-05-22 NOTE — Assessment & Plan Note (Signed)
Discovered during screening for anemia.  He has a macrocytosis and mild leukocytosis without anemia.  B12 injection given today...  Will continue weekly injections x 3 at [facility],  folllowed by monthly injections thereafter

## 2016-05-23 ENCOUNTER — Other Ambulatory Visit: Payer: Self-pay | Admitting: Radiology

## 2016-05-23 DIAGNOSIS — Z113 Encounter for screening for infections with a predominantly sexual mode of transmission: Secondary | ICD-10-CM

## 2016-05-23 LAB — TESTOS,TOTAL,FREE AND SHBG (FEMALE)
SEX HORMONE BINDING GLOB.: 35 nmol/L (ref 10–50)
Testosterone, Free: 68.8 pg/mL (ref 35.0–155.0)
Testosterone,Total,LC/MS/MS: 525 ng/dL (ref 250–1100)

## 2016-05-24 ENCOUNTER — Encounter: Payer: Self-pay | Admitting: Internal Medicine

## 2016-05-24 LAB — URINE CYTOLOGY ANCILLARY ONLY
CHLAMYDIA, DNA PROBE: NEGATIVE
Neisseria Gonorrhea: NEGATIVE

## 2016-06-07 ENCOUNTER — Ambulatory Visit (INDEPENDENT_AMBULATORY_CARE_PROVIDER_SITE_OTHER): Payer: 59 | Admitting: *Deleted

## 2016-06-07 ENCOUNTER — Other Ambulatory Visit: Payer: Self-pay | Admitting: Internal Medicine

## 2016-06-07 DIAGNOSIS — E538 Deficiency of other specified B group vitamins: Secondary | ICD-10-CM | POA: Diagnosis not present

## 2016-06-07 MED ORDER — CYANOCOBALAMIN 1000 MCG/ML IJ SOLN
1000.0000 ug | Freq: Once | INTRAMUSCULAR | Status: AC
  Administered 2016-06-07: 1000 ug via INTRAMUSCULAR

## 2016-06-07 NOTE — Progress Notes (Signed)
Patient presented for B 12 injection to right deltoid patient  Voiced no concerns showed no signs of distress.

## 2016-06-07 NOTE — Progress Notes (Signed)
Care was provided under my supervision. I agree with the management as indicated in the note.  Daliah Chaudoin DO  

## 2016-07-19 ENCOUNTER — Ambulatory Visit (INDEPENDENT_AMBULATORY_CARE_PROVIDER_SITE_OTHER): Payer: 59 | Admitting: Internal Medicine

## 2016-07-19 ENCOUNTER — Encounter: Payer: Self-pay | Admitting: Internal Medicine

## 2016-07-19 VITALS — BP 124/76 | HR 77 | Temp 97.9°F | Resp 16 | Ht 68.0 in | Wt 167.4 lb

## 2016-07-19 DIAGNOSIS — J029 Acute pharyngitis, unspecified: Secondary | ICD-10-CM | POA: Diagnosis not present

## 2016-07-19 DIAGNOSIS — J0100 Acute maxillary sinusitis, unspecified: Secondary | ICD-10-CM | POA: Diagnosis not present

## 2016-07-19 MED ORDER — MOMETASONE FUROATE 0.1 % EX CREA: 1.0000 "application " | TOPICAL_CREAM | Freq: Every day | CUTANEOUS | 0 refills | Status: DC

## 2016-07-19 MED ORDER — AMOXICILLIN-POT CLAVULANATE 875-125 MG PO TABS: 1.0000 | ORAL_TABLET | Freq: Two times a day (BID) | ORAL | 0 refills | Status: DC

## 2016-07-19 MED ORDER — METHYLPREDNISOLONE ACETATE 40 MG/ML IJ SUSP
40.0000 mg | Freq: Once | INTRAMUSCULAR | Status: AC
  Administered 2016-07-19: 40 mg via INTRAMUSCULAR

## 2016-07-19 MED ORDER — PREDNISONE 10 MG PO TABS: ORAL_TABLET | ORAL | 0 refills | Status: DC

## 2016-07-19 MED ORDER — CULTURELLE DIGESTIVE HEALTH PO CAPS: ORAL_CAPSULE | ORAL | 0 refills | Status: DC

## 2016-07-19 NOTE — Patient Instructions (Addendum)
I  am treating you for bacterial sinusitis    I am prescribing an antibiotic (augmentin) and a prednisone taper     To manage the infection and the inflammation in your ear/sinuses.   GARGLE WITH SALT WATER   I also advise use of the following OTC meds to help with your other symptoms.   Take generic OTC benadryl 25 mg at bedtime  for the drainage,  Sudafed PE 20 mg every 6 hours  if needed for congestion,   flush your sinuses daily with NeilMed sinus rinse  (do over the sink because if you do it right you will spit out globs of mucus)   Please take a probiotic ( Align, Flora que or Culturelle) OR A GENERIC EQUIVALENT for three weeks since you are taking an  antibiotic to prevent a very serious antibiotic associated infection  Called clostridium dificile colitis that can cause diarrhea, multi organ failure, sepsis and death if not managed.   ADDING MOMETASONE OINTMENT FOR ITCHY EARS   OK TO USE TURMERIC AND GLUCOSAMINE CHONDRIOINT SULFATE DAILY FOR JOINT PAIN

## 2016-07-19 NOTE — Progress Notes (Signed)
Subjective:  Patient ID: Samuel Riley, male    DOB: [birth year]  Age: 46 y.o. MRN: [id number]  CC: The primary encounter diagnosis was Sore throat. Diagnoses of Pharyngitis, unspecified etiology and Acute maxillary sinusitis, recurrence not specified were also pertinent to this visit.  HPI Samuel Riley presents for   Started with an ear infection last week.  Was treated with drops which helped,.  The throat started hurting,  Developed malaise , weakness.  Hot and cold chills,  Snotty nose  Sinus headaches,   Teeth hurting.   Now chest feel very heavy with deep breathing . Yesterday was the worst.   Using netty pott year round.  2) BILATERAL KNEE PAIN.Marland Kitchen  PLAYED SOCCER  IN COLLEGE,  NO LONGER RUNNING DUE TO RECURRENT PAIN AND SWELLING  EXAMl HUGE TONSILS.     Outpatient Medications Prior to Visit  Medication Sig Dispense Refill  . ALPRAZolam (XANAX XR) 0.5 MG 24 hr tablet Take 1 tablet (0.5 mg total) by mouth daily. 30 tablet 5  . cyanocobalamin (,VITAMIN B-12,) 1000 MCG/ML injection 1 ml injected IM weekly x 3 starting April 2, then monthly by [facility] 10 mL 2  . cyclobenzaprine (FLEXERIL) 5 MG tablet TAKE 1 TABLET (5 MG TOTAL) BY MOUTH AT BEDTIME. 30 tablet 5  . finasteride (PROSCAR) 5 MG tablet   6  . omeprazole (PRILOSEC) 20 MG capsule Take 20 mg by mouth daily.     Facility-Administered Medications Prior to Visit  Medication Dose Route Frequency Provider Last Rate Last Dose  . lidocaine (XYLOCAINE) 2 % (with pres) injection 15 mg  0.75 mL Other Once Crecencio Mc, MD        Review of Systems;  Patient denies , unintentional weight loss, skin rash, eye pain, sinus congestion and sinus pain, dysphagia,  hemoptysis , , dyspnea, wheezing, chest pain, palpitations, orthopnea, edema, abdominal pain, nausea, melena, diarrhea, constipation, flank pain, dysuria, hematuria, urinary  Frequency, nocturia, numbness, tingling, seizures,  Focal weakness, Loss of  consciousness,  Tremor, insomnia, depression, anxiety, and suicidal ideation.      Objective:  BP 124/76 (BP Location: Left Arm, Patient Position: Sitting, Cuff Size: Normal)   Pulse 77   Temp 97.9 F (36.6 C) (Oral)   Resp 16   Ht 5\' 8"  (1.727 m)   Wt 167 lb 6.4 oz (75.9 kg)   SpO2 96%   BMI 25.45 kg/m   BP Readings from Last 3 Encounters:  07/19/16 124/76  05/21/16 122/90  06/29/15 122/82    Wt Readings from Last 3 Encounters:  07/19/16 167 lb 6.4 oz (75.9 kg)  05/21/16 171 lb 9.6 oz (77.8 kg)  06/29/15 174 lb (78.9 kg)    General appearance: alert, cooperative and appears stated age Ears: normal TM's and external ear canals both ears Throat: lips, mucosa, and tongue normal; ERTYHEMATOUS ANLARGED TONSILS BILATERALLY  Neck: no adenopathy, no carotid bruit, supple, symmetrical, trachea midline and thyroid not enlarged, symmetric, no tenderness/mass/nodules Back: symmetric, no curvature. ROM normal. No CVA tenderness. Lungs: clear to auscultation bilaterally Heart: regular rate and rhythm, S1, S2 normal, no murmur, click, rub or gallop Abdomen: soft, non-tender; bowel sounds normal; no masses,  no organomegaly Pulses: 2+ and symmetric Skin: Skin color, texture, turgor normal. No rashes or lesions Lymph nodes: Cervical, supraclavicular, and axillary nodes normal.  Lab Results  Component Value Date   HGBA1C 5.5 05/21/2016    Lab Results  Component Value Date   CREATININE 0.92 05/18/2016   CREATININE  0.91 05/25/2015    Lab Results  Component Value Date   WBC 14.3 (H) 05/18/2016   HGB 16.5 05/18/2016   HCT 48.4 05/18/2016   PLT 390.0 05/18/2016   GLUCOSE 106 (H) 05/18/2016   CHOL 201 (H) 05/18/2016   TRIG 102.0 05/18/2016   HDL 32.90 (L) 05/18/2016   LDLDIRECT 155.0 05/25/2015   LDLCALC 148 (H) 05/18/2016   ALT 16 05/18/2016   AST 14 05/18/2016   NA 138 05/18/2016   K 4.8 05/18/2016   CL 102 05/18/2016   CREATININE 0.92 05/18/2016   BUN 12 05/18/2016    CO2 31 05/18/2016   TSH 1.56 05/25/2015   HGBA1C 5.5 05/21/2016    No results found.  Assessment & Plan:   Problem List Items Addressed This Visit    Sinusitis, acute    WITH PHARYNGITIS (RAID STREP TEST NEGATIVE) AND COUGH.  Treating with       Relevant Medications   amoxicillin-clavulanate (AUGMENTIN) 875-125 MG tablet   predniSONE (DELTASONE) 10 MG tablet   methylPREDNISolone acetate (DEPO-MEDROL) injection 40 mg (Completed)    Other Visit Diagnoses    Sore throat    -  Primary   Relevant Orders   POCT rapid strep A   Pharyngitis, unspecified etiology       Relevant Medications   methylPREDNISolone acetate (DEPO-MEDROL) injection 40 mg (Completed)      I am having [name] start on amoxicillin-clavulanate, Panora, predniSONE, and mometasone. I am also having him maintain his finasteride, omeprazole, ALPRAZolam, cyanocobalamin, and cyclobenzaprine. We administered methylPREDNISolone acetate. We will continue to administer lidocaine.  Meds ordered this encounter  Medications  . amoxicillin-clavulanate (AUGMENTIN) 875-125 MG tablet    Sig: Take 1 tablet by mouth 2 (two) times daily. With food    Dispense:  14 tablet    Refill:  0  . Lactobacillus-Inulin (Campo) CAPS    Sig: 1 capsule daily for 3 weeks minimum    Dispense:  28 capsule    Refill:  0  . predniSONE (DELTASONE) 10 MG tablet    Sig: 6 tablets on Day 1 , then reduce by 1 tablet daily until gone    Dispense:  21 tablet    Refill:  0  . mometasone (ELOCON) 0.1 % cream    Sig: Apply 1 application topically daily. TO EAR CANAL FOR ITCHING    Dispense:  45 g    Refill:  0  . methylPREDNISolone acetate (DEPO-MEDROL) injection 40 mg    There are no discontinued medications.  Follow-up: No Follow-up on file.   Crecencio Mc, MD

## 2016-07-21 DIAGNOSIS — J0110 Acute frontal sinusitis, unspecified: Secondary | ICD-10-CM | POA: Insufficient documentation

## 2016-07-21 DIAGNOSIS — J019 Acute sinusitis, unspecified: Secondary | ICD-10-CM | POA: Insufficient documentation

## 2016-07-21 NOTE — Assessment & Plan Note (Signed)
WITH PHARYNGITIS (RAID STREP TEST NEGATIVE) AND COUGH.  Treating with

## 2016-07-22 LAB — POCT RAPID STREP A (OFFICE): RAPID STREP A SCREEN: NEGATIVE

## 2016-10-11 ENCOUNTER — Ambulatory Visit (INDEPENDENT_AMBULATORY_CARE_PROVIDER_SITE_OTHER): Payer: Self-pay

## 2016-10-11 ENCOUNTER — Encounter: Payer: Self-pay | Admitting: Internal Medicine

## 2016-10-11 ENCOUNTER — Ambulatory Visit (INDEPENDENT_AMBULATORY_CARE_PROVIDER_SITE_OTHER): Payer: 59 | Admitting: Internal Medicine

## 2016-10-11 VITALS — BP 132/86 | HR 71 | Temp 98.0°F | Resp 16 | Ht 68.0 in | Wt 171.8 lb

## 2016-10-11 DIAGNOSIS — M25561 Pain in right knee: Secondary | ICD-10-CM

## 2016-10-11 DIAGNOSIS — M25562 Pain in left knee: Secondary | ICD-10-CM

## 2016-10-11 DIAGNOSIS — M170 Bilateral primary osteoarthritis of knee: Secondary | ICD-10-CM | POA: Diagnosis not present

## 2016-10-11 DIAGNOSIS — G8929 Other chronic pain: Secondary | ICD-10-CM

## 2016-10-11 DIAGNOSIS — M7712 Lateral epicondylitis, left elbow: Secondary | ICD-10-CM

## 2016-10-11 MED ORDER — CELECOXIB 200 MG PO CAPS: 200.0000 mg | ORAL_CAPSULE | Freq: Two times a day (BID) | ORAL | 2 refills | Status: DC

## 2016-10-11 MED ORDER — LIDOCAINE HCL 2 % IJ SOLN
0.7500 mL | Freq: Once | INTRAMUSCULAR | Status: AC
  Administered 2016-10-11: 0.75 mL

## 2016-10-11 MED ORDER — TRIAMCINOLONE ACETONIDE 40 MG/ML IJ SUSP
10.0000 mg | Freq: Once | INTRAMUSCULAR | Status: AC
  Administered 2016-10-11: 10 mg via INTRAMUSCULAR

## 2016-10-11 NOTE — Patient Instructions (Signed)
oK to ice the elbow for 15 minutes every few hours over the next 48 jhours  No heavy lifting or shoveling for a week  You an start the celebrex  Twice daily  For one week,  Then back off to once daily after that     x rays of knees

## 2016-10-11 NOTE — Progress Notes (Signed)
Subjective:  Patient ID: Samuel Riley, male    DOB: 1970-07-29  Age: 46 y.o. MRN: [id number]  CC: The primary encounter diagnosis was Lateral epicondylitis of left elbow. Diagnoses of Bilateral chronic knee pain, Epicondylitis, lateral, left, and Osteoarthritis of both knees, unspecified osteoarthritis type were also pertinent to this visit.  HPI Samuel Riley presents for treatment of left elbow pain.  Pain is located primarily  on lateral side,  Present for months,  Prn motrin not helping,  Does a lot of physically demanding work including shoveling and digging. Also lifts weights. Has not noticed any bruising or swelling.  also c/o bilateral knee pain ,  Notes a lot of crepitus, pain is aggravated by squatting .  Having difficulty getting up from squatting position . No history of fall,  No prior history of surgery or injury     Outpatient Medications Prior to Visit  Medication Sig Dispense Refill  . ALPRAZolam (XANAX XR) 0.5 MG 24 hr tablet Take 1 tablet (0.5 mg total) by mouth daily. 30 tablet 5  . cyanocobalamin (,VITAMIN B-12,) 1000 MCG/ML injection 1 ml injected IM weekly x 3 starting April 2, then monthly by [facility] 10 mL 2  . cyclobenzaprine (FLEXERIL) 5 MG tablet TAKE 1 TABLET (5 MG TOTAL) BY MOUTH AT BEDTIME. 30 tablet 5  . finasteride (PROSCAR) 5 MG tablet   6  . omeprazole (PRILOSEC) 20 MG capsule Take 20 mg by mouth daily.    Marland Kitchen amoxicillin-clavulanate (AUGMENTIN) 875-125 MG tablet Take 1 tablet by mouth 2 (two) times daily. With food (Patient not taking: Reported on 10/11/2016) 14 tablet 0  . Lactobacillus-Inulin ([place]) CAPS 1 capsule daily for 3 weeks minimum (Patient not taking: Reported on 10/11/2016) 28 capsule 0  . mometasone (ELOCON) 0.1 % cream Apply 1 application topically daily. TO EAR CANAL FOR ITCHING (Patient not taking: Reported on 10/11/2016) 45 g 0  . predniSONE (DELTASONE) 10 MG tablet 6 tablets on Day 1 , then reduce by  1 tablet daily until gone (Patient not taking: Reported on 10/11/2016) 21 tablet 0   Facility-Administered Medications Prior to Visit  Medication Dose Route Frequency Provider Last Rate Last Dose  . lidocaine (XYLOCAINE) 2 % (with pres) injection 15 mg  0.75 mL Other Once Crecencio Mc, MD        Review of Systems;  Patient denies headache, fevers, malaise, unintentional weight loss, skin rash, eye pain, sinus congestion and sinus pain, sore throat, dysphagia,  hemoptysis , cough, dyspnea, wheezing, chest pain, palpitations, orthopnea, edema, abdominal pain, nausea, melena, diarrhea, constipation, flank pain, dysuria, hematuria, urinary  Frequency, nocturia, numbness, tingling, seizures,  Focal weakness, Loss of consciousness,  Tremor, insomnia, depression, anxiety, and suicidal ideation.      Objective:  BP 132/86 (BP Location: Left Arm, Patient Position: Sitting, Cuff Size: Normal)   Pulse 71   Temp 98 F (36.7 C) (Oral)   Resp 16   Ht 5\' 8"  (1.727 m)   Wt 171 lb 12.8 oz (77.9 kg)   SpO2 97%   BMI 26.12 kg/m   BP Readings from Last 3 Encounters:  10/11/16 132/86  07/19/16 124/76  05/21/16 122/90    Wt Readings from Last 3 Encounters:  10/11/16 171 lb 12.8 oz (77.9 kg)  07/19/16 167 lb 6.4 oz (75.9 kg)  05/21/16 171 lb 9.6 oz (77.8 kg)    General appearance: alert, cooperative and appears stated age rical, trachea midline and thyroid not enlarged, symmetric, no tenderness/mass/nodules  Back: symmetric, no curvature. ROM normal. No CVA tenderness. Lungs: clear to auscultation bilaterally Heart: regular rate and rhythm, S1, S2 normal, no murmur, click, rub or gallop Abdomen: soft, non-tender; bowel sounds normal; no masses,  no organomegaly Pulses: 2+ and symmetric MSK: left knee without warmth or  redness.  Point tenderness at lateral epicondyle aggravated by forced supination .  Bilateral crepitus of both knees,  No warmth or effusion.  Skin: Skin color, texture, turgor  normal. No rashes or lesions Lymph nodes: Cervical, supraclavicular, and axillary nodes normal.  Lab Results  Component Value Date   HGBA1C 5.5 05/21/2016    Lab Results  Component Value Date   CREATININE 0.92 05/18/2016   CREATININE 0.91 05/25/2015    Lab Results  Component Value Date   WBC 14.3 (H) 05/18/2016   HGB 16.5 05/18/2016   HCT 48.4 05/18/2016   PLT 390.0 05/18/2016   GLUCOSE 106 (H) 05/18/2016   CHOL 201 (H) 05/18/2016   TRIG 102.0 05/18/2016   HDL 32.90 (L) 05/18/2016   LDLDIRECT 155.0 05/25/2015   LDLCALC 148 (H) 05/18/2016   ALT 16 05/18/2016   AST 14 05/18/2016   NA 138 05/18/2016   K 4.8 05/18/2016   CL 102 05/18/2016   CREATININE 0.92 05/18/2016   BUN 12 05/18/2016   CO2 31 05/18/2016   TSH 1.56 05/25/2015   HGBA1C 5.5 05/21/2016    No results found.  Assessment & Plan:   Problem List Items Addressed This Visit    Epicondylitis, lateral, left    Patient requesting injection of lateral elbow for relief of pain . Informed consent obtained. Lateral elbow was cleaned and prepared with betadine and numbed with  anesthetic spray.  Tendon was peppered with 64ml  solution of triamcinolone 10 mg and 0.75 ml Xylocaine 2%.  No immediate complications were noted.  Patient was advised to ice the joint for `5 minutes several times over the next 24 hours and avoid strenuous use of the joint for one week. Patient was instructed to call the office if he developed redness, swelling, numbness or increased pain.        Relevant Medications   celecoxib (CELEBREX) 200 MG capsule   triamcinolone acetonide (KENALOG-40) injection 10 mg (Completed)   DJD (degenerative joint disease) of knee    Bilateral, with mild degenerative changes , slight loss of joint space and spurring .  Loose bodies present in both posterior joint spaces.  Recommend trial of celebrex,  Referral to [facility]       Relevant Medications   celecoxib (CELEBREX) 200 MG capsule   triamcinolone  acetonide (KENALOG-40) injection 10 mg (Completed)   Other Relevant Orders   Ambulatory referral to Orthopedic Surgery    Other Visit Diagnoses    Lateral epicondylitis of left elbow    -  Primary   Relevant Medications   celecoxib (CELEBREX) 200 MG capsule   triamcinolone acetonide (KENALOG-40) injection 10 mg (Completed)   lidocaine (XYLOCAINE) 2 % (with pres) injection 15 mg (Completed)   Bilateral chronic knee pain       Relevant Medications   celecoxib (CELEBREX) 200 MG capsule   triamcinolone acetonide (KENALOG-40) injection 10 mg (Completed)   lidocaine (XYLOCAINE) 2 % (with pres) injection 15 mg (Completed)   Other Relevant Orders   DG Knee Complete 4 Views Left (Completed)   DG Knee Complete 4 Views Right (Completed)      I have discontinued Mr. Tomey amoxicillin-clavulanate, [place], predniSONE, and mometasone.  I am also having him start on celecoxib. Additionally, I am having him maintain his finasteride, omeprazole, ALPRAZolam, cyanocobalamin, and cyclobenzaprine. We administered triamcinolone acetonide and lidocaine. We will continue to administer lidocaine.  Meds ordered this encounter  Medications  . celecoxib (CELEBREX) 200 MG capsule    Sig: Take 1 capsule (200 mg total) by mouth 2 (two) times daily.    Dispense:  60 capsule    Refill:  2  . triamcinolone acetonide (KENALOG-40) injection 10 mg  . lidocaine (XYLOCAINE) 2 % (with pres) injection 15 mg    Medications Discontinued During This Encounter  Medication Reason  . amoxicillin-clavulanate (AUGMENTIN) 875-125 MG tablet Completed Course  . Lactobacillus-Inulin ([place]) CAPS Error  . mometasone (ELOCON) 0.1 % cream Patient has not taken in last 30 days  . predniSONE (DELTASONE) 10 MG tablet Patient has not taken in last 30 days    Follow-up: No Follow-up on file.   Crecencio Mc, MD

## 2016-10-12 DIAGNOSIS — M1710 Unilateral primary osteoarthritis, unspecified knee: Secondary | ICD-10-CM | POA: Insufficient documentation

## 2016-10-12 DIAGNOSIS — M7712 Lateral epicondylitis, left elbow: Secondary | ICD-10-CM | POA: Insufficient documentation

## 2016-10-12 DIAGNOSIS — M179 Osteoarthritis of knee, unspecified: Secondary | ICD-10-CM | POA: Insufficient documentation

## 2016-10-12 NOTE — Assessment & Plan Note (Signed)
Bilateral, with mild degenerative changes , slight loss of joint space and spurring .  Loose bodies present in both posterior joint spaces.  Recommend trial of celebrex,  Referral to [facility]

## 2016-10-12 NOTE — Assessment & Plan Note (Signed)
Patient requesting injection of lateral elbow for relief of pain . Informed consent obtained. Lateral elbow was cleaned and prepared with betadine and numbed with  anesthetic spray.  Tendon was peppered with 73ml  solution of triamcinolone 10 mg and 0.75 ml Xylocaine 2%.  No immediate complications were noted.  Patient was advised to ice the joint for `5 minutes several times over the next 24 hours and avoid strenuous use of the joint for one week. Patient was instructed to call the office if he developed redness, swelling, numbness or increased pain.

## 2016-10-13 ENCOUNTER — Encounter: Payer: Self-pay | Admitting: Internal Medicine

## 2016-12-03 ENCOUNTER — Other Ambulatory Visit: Payer: Self-pay | Admitting: Internal Medicine

## 2017-03-06 DIAGNOSIS — D239 Other benign neoplasm of skin, unspecified: Secondary | ICD-10-CM

## 2017-03-06 HISTORY — DX: Other benign neoplasm of skin, unspecified: D23.9

## 2017-05-08 ENCOUNTER — Other Ambulatory Visit: Payer: Self-pay | Admitting: Internal Medicine

## 2017-05-08 NOTE — Telephone Encounter (Signed)
Please notify patient that the prescription  was Refilled , BUT  it has been 9 months since last visit. Marland Kitchen  OFFICE VISIT NEEDED prior to any more refills

## 2017-05-08 NOTE — Telephone Encounter (Signed)
Refilled: 12/04/2016 Last OV: 10/11/2016 Next OV: not scheduled

## 2017-05-09 ENCOUNTER — Telehealth: Payer: Self-pay

## 2017-05-09 DIAGNOSIS — E538 Deficiency of other specified B group vitamins: Secondary | ICD-10-CM

## 2017-05-09 DIAGNOSIS — Z0000 Encounter for general adult medical examination without abnormal findings: Secondary | ICD-10-CM

## 2017-05-09 NOTE — Telephone Encounter (Signed)
rx has been printed, signed and faxed.  

## 2017-05-09 NOTE — Telephone Encounter (Signed)
Can we use 5 pm on 19 th I had scheduled MS. Bradshaw there at lunch before you sent me message.

## 2017-05-09 NOTE — Telephone Encounter (Signed)
Spoke with pt and he scheduled his appt for 05/14/2017. Pt is aware of appt date and time.

## 2017-05-09 NOTE — Telephone Encounter (Signed)
Will you please schedule pt for 05/14/2017 at 2:30pm. It will not let me schedule.

## 2017-05-10 NOTE — Telephone Encounter (Signed)
Pt has been scheduled for a CPE with Dr. Derrel Nip in April and a fasting lab appt. Pt is aware of appt date and time. Labs have been ordered.

## 2017-06-10 ENCOUNTER — Other Ambulatory Visit (INDEPENDENT_AMBULATORY_CARE_PROVIDER_SITE_OTHER): Payer: 59

## 2017-06-10 DIAGNOSIS — Z0000 Encounter for general adult medical examination without abnormal findings: Secondary | ICD-10-CM

## 2017-06-10 DIAGNOSIS — E538 Deficiency of other specified B group vitamins: Secondary | ICD-10-CM | POA: Diagnosis not present

## 2017-06-10 DIAGNOSIS — D582 Other hemoglobinopathies: Secondary | ICD-10-CM

## 2017-06-10 DIAGNOSIS — D72829 Elevated white blood cell count, unspecified: Secondary | ICD-10-CM

## 2017-06-10 LAB — VITAMIN B12: Vitamin B-12: 481 pg/mL (ref 211–911)

## 2017-06-10 LAB — CBC WITH DIFFERENTIAL/PLATELET
BASOS PCT: 1.2 % (ref 0.0–3.0)
Basophils Absolute: 0.1 10*3/uL (ref 0.0–0.1)
Eosinophils Absolute: 0.3 10*3/uL (ref 0.0–0.7)
Eosinophils Relative: 2.4 % (ref 0.0–5.0)
HCT: 50.7 % (ref 39.0–52.0)
HEMOGLOBIN: 17.6 g/dL — AB (ref 13.0–17.0)
LYMPHS ABS: 3.2 10*3/uL (ref 0.7–4.0)
Lymphocytes Relative: 26 % (ref 12.0–46.0)
MCHC: 34.7 g/dL (ref 30.0–36.0)
MCV: 101.7 fl — ABNORMAL HIGH (ref 78.0–100.0)
MONO ABS: 1.1 10*3/uL — AB (ref 0.1–1.0)
Monocytes Relative: 9.1 % (ref 3.0–12.0)
NEUTROS PCT: 61.3 % (ref 43.0–77.0)
Neutro Abs: 7.5 10*3/uL (ref 1.4–7.7)
Platelets: 379 10*3/uL (ref 150.0–400.0)
RBC: 4.98 Mil/uL (ref 4.22–5.81)
RDW: 13.6 % (ref 11.5–15.5)
WBC: 12.3 10*3/uL — AB (ref 4.0–10.5)

## 2017-06-10 LAB — LIPID PANEL
Cholesterol: 208 mg/dL — ABNORMAL HIGH (ref 0–200)
HDL: 33.8 mg/dL — AB (ref >39.00)
NonHDL: 174.12
TRIGLYCERIDES: 223 mg/dL — AB (ref 0.0–149.0)
Total CHOL/HDL Ratio: 6
VLDL: 44.6 mg/dL — AB (ref 0.0–40.0)

## 2017-06-10 LAB — COMPREHENSIVE METABOLIC PANEL
ALBUMIN: 4 g/dL (ref 3.5–5.2)
ALT: 16 U/L (ref 0–53)
AST: 15 U/L (ref 0–37)
Alkaline Phosphatase: 61 U/L (ref 39–117)
BUN: 13 mg/dL (ref 6–23)
CHLORIDE: 103 meq/L (ref 96–112)
CO2: 28 meq/L (ref 19–32)
CREATININE: 1.05 mg/dL (ref 0.40–1.50)
Calcium: 9.2 mg/dL (ref 8.4–10.5)
GFR: 80.1 mL/min (ref >60.00)
GLUCOSE: 104 mg/dL — AB (ref 70–99)
POTASSIUM: 4.6 meq/L (ref 3.5–5.1)
SODIUM: 137 meq/L (ref 135–145)
Total Bilirubin: 0.7 mg/dL (ref 0.2–1.2)
Total Protein: 6.9 g/dL (ref 6.0–8.3)

## 2017-06-10 LAB — HEMOGLOBIN A1C: Hgb A1c MFr Bld: 5.2 % (ref 4.6–6.5)

## 2017-06-10 LAB — LDL CHOLESTEROL, DIRECT: LDL DIRECT: 140 mg/dL

## 2017-06-11 ENCOUNTER — Encounter: Payer: Self-pay | Admitting: Internal Medicine

## 2017-06-11 DIAGNOSIS — D72821 Monocytosis (symptomatic): Secondary | ICD-10-CM | POA: Insufficient documentation

## 2017-06-11 DIAGNOSIS — D72829 Elevated white blood cell count, unspecified: Secondary | ICD-10-CM | POA: Insufficient documentation

## 2017-06-11 NOTE — Addendum Note (Signed)
Addended by: Crecencio Mc on: 06/11/2017 08:40 AM   Modules accepted: Orders

## 2017-06-12 ENCOUNTER — Telehealth: Payer: Self-pay | Admitting: Internal Medicine

## 2017-06-12 ENCOUNTER — Ambulatory Visit (INDEPENDENT_AMBULATORY_CARE_PROVIDER_SITE_OTHER): Payer: 59 | Admitting: Internal Medicine

## 2017-06-12 ENCOUNTER — Encounter: Payer: Self-pay | Admitting: Internal Medicine

## 2017-06-12 VITALS — BP 136/88 | HR 71 | Temp 98.2°F | Resp 15 | Ht 68.0 in | Wt 171.4 lb

## 2017-06-12 DIAGNOSIS — E785 Hyperlipidemia, unspecified: Secondary | ICD-10-CM | POA: Diagnosis not present

## 2017-06-12 DIAGNOSIS — D72828 Other elevated white blood cell count: Secondary | ICD-10-CM

## 2017-06-12 DIAGNOSIS — Z113 Encounter for screening for infections with a predominantly sexual mode of transmission: Secondary | ICD-10-CM

## 2017-06-12 DIAGNOSIS — E538 Deficiency of other specified B group vitamins: Secondary | ICD-10-CM

## 2017-06-12 DIAGNOSIS — M7712 Lateral epicondylitis, left elbow: Secondary | ICD-10-CM

## 2017-06-12 DIAGNOSIS — Z0000 Encounter for general adult medical examination without abnormal findings: Secondary | ICD-10-CM | POA: Diagnosis not present

## 2017-06-12 MED ORDER — CELECOXIB 400 MG PO CAPS: 400.0000 mg | ORAL_CAPSULE | Freq: Every day | ORAL | 1 refills | Status: DC

## 2017-06-12 NOTE — Assessment & Plan Note (Signed)
Elevated trigs and low HDL.  Addressed diet and alcohol intake. Advised to reduce daily take of potato chips and reduce alcohol to 2 shots/night.

## 2017-06-12 NOTE — Assessment & Plan Note (Signed)
Mild,  Present for the last 2 years with no appreciable change; however, he now has an elevated hgb.  Does not donate blood.   He is s/p remote splenectomy .  Will  recommend referral to hematology for further evaluation to rule out PVC, CLL, HH, etc.

## 2017-06-12 NOTE — Progress Notes (Signed)
Patient ID: Samuel Riley, male    DOB: 1970-06-05  Age: 47 y.o. MRN: [id number]  The patient is here for annual preventive  examination and management of other chronic and acute problems.   The risk factors are reflected in the social history.  The roster of all physicians providing medical care to patient - is listed in the Snapshot section of the chart.  Activities of daily living:  The patient is 100% independent in all ADLs: dressing, toileting, feeding as well as independent mobility  Home safety : The patient has smoke detectors in the home. They wear seatbelts.  There are no firearms at home. There is no violence in the home.   There is no risks for hepatitis, STDs or HIV. There is no   history of blood transfusion. They have no travel history to infectious disease endemic areas of the world.  The patient has seen their dentist in the last six month. They have seen their eye doctor in the last year.   They do not  have excessive sun exposure. Discussed the need for sun protection: hats, long sleeves and use of sunscreen if there is significant sun exposure.   Diet: the importance of a healthy diet is discussed. They do have a healthy diet but he indulges in Pringles potato chips daily and averages 4 shots of gin ("2 stiff drinks") per night .  The benefits of regular aerobic exercise were discussed. He works out 4 days per week lifting weights,  Cardio,  Also [employer] and skis for pleasure   Depression screen: there are no signs or vegative symptoms of depression- irritability, change in appetite, anhedonia, sadness/tearfullness..  Uses the alprazolam prn for anger management in anticipated confrontational situations,  Sometimes doesn't use it for a week at a time.    The following portions of the patient's history were reviewed and updated as appropriate: allergies, current medications, past family history, past medical history,  past surgical history, past social history  and problem  list.  Visual acuity was not assessed per patient preference since he has regular follow up with his ophthalmologist. Hearing and body mass index were assessed and reviewed.   During the course of the visit the patient was educated and counseled about appropriate screening and preventive services including :  diabetes screening, nutrition counseling, colorectal cancer screening, and recommended immunizations.    CC: The primary encounter diagnosis was Screening for STD (sexually transmitted disease). Diagnoses of Dyslipidemia, Other elevated white blood cell (WBC) count, Epicondylitis, lateral, left, Encounter for preventive health examination, and B12 deficiency were also pertinent to this visit.  1) left lateral  elbow  Pain, moderate.  Present  for the last 6 weeks.  Not aggravated by  lifting weights or doing push ups ,  But aggravated by golf swing and lifiting anything with elbow extended .  2) Both knees also bothering him.  Left one swells periodically,  Has had Synvisc injections in the  past.  never tried the celebrex last year  History Samuel Riley has no past medical history on file.   He has a past surgical history that includes Cholecystectomy and Splenectomy, total (N/A, 2000).   His family history includes Coronary artery disease in his sister; Hypertension in his brother and father.He reports that he quit smoking about 14 months ago. His smoking use included cigarettes. He smoked 0.50 packs per day. He has never used smokeless tobacco. He reports that he does not drink alcohol or use drugs.  Outpatient Medications  Prior to Visit  Medication Sig Dispense Refill  . ALPRAZolam (XANAX XR) 0.5 MG 24 hr tablet TAKE 1 TABLET BY MOUTH EVERY DAY 30 tablet 1  . cyanocobalamin (,VITAMIN B-12,) 1000 MCG/ML injection 1 ml injected IM weekly x 3 starting April 2, then monthly by [facility] 10 mL 2  . cyclobenzaprine (FLEXERIL) 5 MG tablet TAKE 1 TABLET (5 MG TOTAL) BY MOUTH AT BEDTIME. 30  tablet 5  . finasteride (PROSCAR) 5 MG tablet   6  . omeprazole (PRILOSEC) 20 MG capsule Take 20 mg by mouth daily.    . celecoxib (CELEBREX) 200 MG capsule Take 1 capsule (200 mg total) by mouth 2 (two) times daily. 60 capsule 2   Facility-Administered Medications Prior to Visit  Medication Dose Route Frequency Provider Last Rate Last Dose  . lidocaine (XYLOCAINE) 2 % (with pres) injection 15 mg  0.75 mL Other Once Crecencio Mc, MD        Review of Systems   Patient denies headache, fevers, malaise, unintentional weight loss, skin rash, eye pain, sinus congestion and sinus pain, sore throat, dysphagia,  hemoptysis , cough, dyspnea, wheezing, chest pain, palpitations, orthopnea, edema, abdominal pain, nausea, melena, diarrhea, constipation, flank pain, dysuria, hematuria, urinary  Frequency, nocturia, numbness, tingling, seizures,  Focal weakness, Loss of consciousness,  Tremor, insomnia, depression, anxiety, and suicidal ideation.     Objective:  BP 136/88 (BP Location: Left Arm, Patient Position: Sitting, Cuff Size: Normal)   Pulse 71   Temp 98.2 F (36.8 C) (Oral)   Resp 15   Ht 5\' 8"  (1.727 m)   Wt 171 lb 6.4 oz (77.7 kg)   SpO2 94%   BMI 26.06 kg/m   Physical Exam   General appearance: alert, cooperative and appears stated age Head: Normocephalic, without obvious abnormality, atraumatic Eyes: conjunctivae/corneas clear. PERRL, EOM's intact. Fundi benign. Ears: normal TM's and external ear canals both ears Nose: Nares normal. Septum midline. Mucosa normal. No drainage or sinus tenderness. Throat: lips, mucosa, and tongue normal; teeth and gums normal Neck: no adenopathy, no carotid bruit, no JVD, supple, symmetrical, trachea midline and thyroid not enlarged, symmetric, no tenderness/mass/nodules Lungs: clear to auscultation bilaterally Breasts: normal appearance, no masses or tenderness Heart: regular rate and rhythm, S1, S2 normal, no murmur, click, rub or  gallop Abdomen: soft, non-tender; bowel sounds normal; no masses,  no organomegaly Extremities: left lateral elbow pain with extension and raising of arm ,  Now effusion,   Pulses: 2+ and symmetric Skin: Skin color, texture, turgor normal. No rashes or lesions Neurologic: Alert and oriented X 3, normal strength and tone. Normal symmetric reflexes. Normal coordination and gait.      Assessment & Plan:   Problem List Items Addressed This Visit    Leukocytosis    Mild,  Present for the last 2 years with no appreciable change; however, he now has an elevated hgb.  Does not donate blood.   He is s/p remote splenectomy .  Will  recommend referral to hematology for further evaluation to rule out PVC, CLL, HH, etc.         Dyslipidemia    Elevated trigs and low HDL.  Addressed diet and alcohol intake. Advised to reduce daily take of potato chips and reduce alcohol to 2 shots/night.       Epicondylitis, lateral, left    Recommend trial of celebrex 200 mg bid or 400 mg qd,  Ice ,  Already taking PPI  Relevant Medications   celecoxib (CELEBREX) 400 MG capsule   Encounter for preventive health examination    Annual comprehensive preventive exam was done as well as an evaluation and management of acute and chronic conditions .  During the course of the visit the patient was educated and counseled about appropriate screening and preventive services including :  diabetes screening, lipid analysis with projected  10 year  risk for CAD , nutrition counseling, prostate and colorectal cancer screening, and recommended immunizations.  Printed recommendations for health maintenance screenings was given.       B12 deficiency    Found in 2017  During screen for anemia.  Continue monthly injections       Other Visit Diagnoses    Screening for STD (sexually transmitted disease)    -  Primary   Relevant Orders   HIV antibody   Hepatitis C antibody   GC/Chlamydia Probe Amp      I have  discontinued Iziah Frankowski's celecoxib. I am also having him start on celecoxib. Additionally, I am having him maintain his finasteride, omeprazole, cyanocobalamin, cyclobenzaprine, and ALPRAZolam. We will continue to administer lidocaine.  Meds ordered this encounter  Medications  . celecoxib (CELEBREX) 400 MG capsule    Sig: Take 1 capsule (400 mg total) by mouth daily after breakfast.    Dispense:  30 capsule    Refill:  1    Medications Discontinued During This Encounter  Medication Reason  . celecoxib (CELEBREX) 200 MG capsule     Follow-up: No follow-ups on file.   Crecencio Mc, MD

## 2017-06-12 NOTE — Patient Instructions (Addendum)
celebrex 400 mg once daily for tendonitis  Ice it for 20-30 minutes  After usr of arm   reducne alcohol to 2 shots daily  Reduce pringles  to one inch stack per day max  Nuts ok but avoid salted   (pistachios, walnuts pecans,  And almonds)   Mini sweet peppers   ([facility] has em)  [employer],   The one [employer] carries are made from chickpeas   Have hematology draw the HIV and Hep C labs with any labs they do   Bring back a urine specimen for GC/Chlamydia

## 2017-06-12 NOTE — Assessment & Plan Note (Addendum)
Recommend trial of celebrex 200 mg bid or 400 mg qd,  Ice ,  Already taking PPI

## 2017-06-13 ENCOUNTER — Encounter: Payer: Self-pay | Admitting: Internal Medicine

## 2017-06-13 NOTE — Assessment & Plan Note (Signed)
Found in 2017  During screen for anemia.  Continue monthly injections

## 2017-06-13 NOTE — Assessment & Plan Note (Signed)

## 2017-06-17 ENCOUNTER — Encounter: Payer: Self-pay | Admitting: Oncology

## 2017-06-17 ENCOUNTER — Inpatient Hospital Stay: Payer: 59 | Attending: Oncology | Admitting: Oncology

## 2017-06-17 ENCOUNTER — Other Ambulatory Visit: Payer: Self-pay

## 2017-06-17 ENCOUNTER — Inpatient Hospital Stay: Payer: 59

## 2017-06-17 VITALS — BP 151/100 | HR 67 | Temp 94.7°F | Resp 18 | Ht 70.47 in | Wt 173.6 lb

## 2017-06-17 DIAGNOSIS — D751 Secondary polycythemia: Secondary | ICD-10-CM

## 2017-06-17 DIAGNOSIS — R030 Elevated blood-pressure reading, without diagnosis of hypertension: Secondary | ICD-10-CM | POA: Insufficient documentation

## 2017-06-17 DIAGNOSIS — Z9081 Acquired absence of spleen: Secondary | ICD-10-CM | POA: Insufficient documentation

## 2017-06-17 DIAGNOSIS — F17290 Nicotine dependence, other tobacco product, uncomplicated: Secondary | ICD-10-CM | POA: Insufficient documentation

## 2017-06-17 DIAGNOSIS — Z79899 Other long term (current) drug therapy: Secondary | ICD-10-CM | POA: Diagnosis not present

## 2017-06-17 DIAGNOSIS — Z720 Tobacco use: Secondary | ICD-10-CM

## 2017-06-17 LAB — LACTATE DEHYDROGENASE: LDH: 137 U/L (ref 98–192)

## 2017-06-17 LAB — TSH: TSH: 0.847 u[IU]/mL (ref 0.350–4.500)

## 2017-06-17 NOTE — Progress Notes (Signed)
Hematology/Oncology Consult note [facility] [phone] [phone]   Patient Care Team: Crecencio Mc, MD as PCP - General (Internal Medicine)  REFERRING PROVIDER: Crecencio Mc, MD CHIEF COMPLAINTS/PURPOSE OF CONSULTATION:  Evaluation of elevated hemoglobin.   HISTORY OF PRESENTING ILLNESS:  Samuel Riley is a  47 y.o.  male with PMH listed below who was referred to me for evaluation of elevated hemoglobin.  Recent CBC showed hemoglobin of 17.6, Hct 50.7. Former smoker, he quits in 2018. However, he admits to use cigar "off and on" to relieve stress. 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 occasionally he has day time somnolence.  Denies fever or chills, weight loss, night sweats.   Review of Systems  Constitutional: Negative for chills, fever and malaise/fatigue.  HENT: Negative for ear discharge, ear pain and hearing loss.   Eyes: Negative for double vision and pain.  Respiratory: Negative for cough and hemoptysis.   Cardiovascular: Negative for chest pain and orthopnea.  Gastrointestinal: Negative for abdominal pain, nausea and vomiting.  Genitourinary: Negative for dysuria.  Musculoskeletal: Negative for myalgias.  Skin: Negative for rash.  Neurological: Negative for dizziness and headaches.  Endo/Heme/Allergies: Negative for environmental allergies. Does not bruise/bleed easily.  Psychiatric/Behavioral: Negative for depression and hallucinations. The patient is nervous/anxious.     MEDICAL HISTORY:  No past medical history on file.  SURGICAL HISTORY: Past Surgical History:  Procedure Laterality Date  . CHOLECYSTECTOMY    . SPLENECTOMY, TOTAL N/A 2000   [facility]    SOCIAL HISTORY: Social History   Socioeconomic History  . Marital status: Divorced    Spouse name: Not on file  . Number of children: 1  . Years of education: Not on file  . Highest education level: Not on file  Occupational  History  . Occupation: Armed forces operational officer  Social Needs  . Financial resource strain: Not on file  . Food insecurity:    Worry: Not on file    Inability: Not on file  . Transportation needs:    Medical: Not on file    Non-medical: Not on file  Tobacco Use  . Smoking status: Former Smoker    Packs/day: 0.50    Types: Cigarettes    Last attempt to quit: 03/29/2016    Years since quitting: 1.2  . Smokeless tobacco: Never Used  Substance and Sexual Activity  . Alcohol use: No    Alcohol/week: 0.0 oz  . Drug use: No  . Sexual activity: Yes    Partners: Female    Birth control/protection: Condom  Lifestyle  . Physical activity:    Days per week: Not on file    Minutes per session: Not on file  . Stress: Not on file  Relationships  . Social connections:    Talks on phone: Not on file    Gets together: Not on file    Attends religious service: Not on file    Active member of club or organization: Not on file    Attends meetings of clubs or organizations: Not on file    Relationship status: Not on file  . Intimate partner violence:    Fear of current or ex partner: Not on file    Emotionally abused: Not on file    Physically abused: Not on file    Forced sexual activity: Not on file  Other Topics Concern  . Not on file  Social History Narrative  . Not on file    FAMILY  HISTORY: Family History  Problem Relation Age of Onset  . Hypertension Father   . Hypertension Brother   . Coronary artery disease Sister     ALLERGIES:  has No Known Allergies.  MEDICATIONS:  Current Outpatient Medications  Medication Sig Dispense Refill  . ALPRAZolam (XANAX XR) 0.5 MG 24 hr tablet TAKE 1 TABLET BY MOUTH EVERY DAY 30 tablet 1  . celecoxib (CELEBREX) 400 MG capsule Take 1 capsule (400 mg total) by mouth daily after breakfast. 30 capsule 1  . cyanocobalamin (,VITAMIN B-12,) 1000 MCG/ML injection 1 ml injected IM weekly x 3 starting April 2, then monthly by [facility] 10 mL 2  .  cyclobenzaprine (FLEXERIL) 5 MG tablet TAKE 1 TABLET (5 MG TOTAL) BY MOUTH AT BEDTIME. 30 tablet 5  . finasteride (PROSCAR) 5 MG tablet   6  . omeprazole (PRILOSEC) 20 MG capsule Take 20 mg by mouth daily.     Current Facility-Administered Medications  Medication Dose Route Frequency Provider Last Rate Last Dose  . lidocaine (XYLOCAINE) 2 % (with pres) injection 15 mg  0.75 mL Other Once Crecencio Mc, MD         PHYSICAL EXAMINATION: ECOG PERFORMANCE STATUS: 0 - Asymptomatic Vitals:   06/17/17 0935  BP: (!) 151/100  Pulse: 67  Resp: 18  Temp: (!) 94.7 F (34.8 C)   Filed Weights   06/17/17 0935  Weight: 173 lb 9.6 oz (78.7 kg)    Physical Exam  Constitutional: He is oriented to person, place, and time. He appears well-developed and well-nourished.  HENT:  Head: Normocephalic and atraumatic.  Nose: Nose normal.  Mouth/Throat: Oropharynx is clear and moist. No oropharyngeal exudate.  Eyes: Pupils are equal, round, and reactive to light. EOM are normal. Left eye exhibits no discharge. No scleral icterus.  Neck: Normal range of motion. Neck supple.  Cardiovascular: Normal rate, regular rhythm and normal heart sounds.  No murmur heard. Pulmonary/Chest: Effort normal and breath sounds normal. No stridor. No respiratory distress.  Abdominal: Soft. Bowel sounds are normal. He exhibits no distension.  Musculoskeletal: Normal range of motion. He exhibits no edema.  Neurological: He is alert and oriented to person, place, and time. No cranial nerve deficit. Coordination normal.  Skin: Skin is warm and dry.  Psychiatric: He has a normal mood and affect. His behavior is normal.     LABORATORY DATA:  I have reviewed the data as listed Lab Results  Component Value Date   WBC 12.3 (H) 06/10/2017   HGB 17.6 (H) 06/10/2017   HCT 50.7 06/10/2017   MCV 101.7 (H) 06/10/2017   PLT 379.0 06/10/2017   Recent Labs    06/10/17 0754  NA 137  K 4.6  CL 103  CO2 28  GLUCOSE 104*    BUN 13  CREATININE 1.05  CALCIUM 9.2  PROT 6.9  ALBUMIN 4.0  AST 15  ALT 16  ALKPHOS 61  BILITOT 0.7       ASSESSMENT & PLAN:  1. Erythrocytosis   2. Tobacco use   3. History of splenectomy   4. Elevated blood pressure reading    # Lab results explained to patient,  given his history of tobacco use, erythrocytosis can be secondary.   Smoking cessation discussed in detail with patient.  I recommend checking LDH, TSH, erythropoietin, flow cytometry and Jak 2 mutation with reflex..  # Patient has a history of splenectomy which can potentially cause thrombocytosis, really may cause erythrocytosis. # Elevated BP can be  due to anxiety of coming to cancer center. He is asymptomatic and advise patient to continue follow up with PCP and have blood pressure re checked.   All questions were answered. The patient knows to call the clinic with any problems questions or concerns.  Return of visit: 2 weeks to discuss lab results. Thank you for this kind referral and the opportunity to participate in the care of this patient. A copy of today's note is routed to referring provider    Earlie Server, MD, PhD Hematology Oncology [facility] Pager- [phone] 06/17/2017

## 2017-06-17 NOTE — Progress Notes (Signed)
Here for new pt evaluation. BP elevated 151/100 -pt asymptomatic . Dr Tasia Catchings informed 1010 a-repeat BP 166/113 -pt asymptomatic. And Dr Tasia Catchings informed and stated ok to have pt leave. F/u w pcp as needed

## 2017-06-18 LAB — ERYTHROPOIETIN: ERYTHROPOIETIN: 4.7 m[IU]/mL (ref 2.6–18.5)

## 2017-06-19 LAB — COMP PANEL: LEUKEMIA/LYMPHOMA

## 2017-06-26 LAB — CALR + JAK2 E12-15 + MPL (REFLEXED)

## 2017-06-26 LAB — JAK2 V617F, W REFLEX TO CALR/E12/MPL

## 2017-06-28 ENCOUNTER — Encounter: Payer: Self-pay | Admitting: Oncology

## 2017-06-28 ENCOUNTER — Inpatient Hospital Stay: Payer: 59 | Attending: Oncology | Admitting: Oncology

## 2017-06-28 ENCOUNTER — Other Ambulatory Visit: Payer: Self-pay

## 2017-06-28 ENCOUNTER — Inpatient Hospital Stay: Payer: 59

## 2017-06-28 VITALS — BP 138/90 | HR 73 | Temp 96.5°F | Wt 171.6 lb

## 2017-06-28 DIAGNOSIS — Z9081 Acquired absence of spleen: Secondary | ICD-10-CM | POA: Insufficient documentation

## 2017-06-28 DIAGNOSIS — D751 Secondary polycythemia: Secondary | ICD-10-CM | POA: Diagnosis not present

## 2017-06-28 DIAGNOSIS — Z87891 Personal history of nicotine dependence: Secondary | ICD-10-CM | POA: Diagnosis not present

## 2017-06-28 DIAGNOSIS — F419 Anxiety disorder, unspecified: Secondary | ICD-10-CM | POA: Diagnosis not present

## 2017-06-28 DIAGNOSIS — Z720 Tobacco use: Secondary | ICD-10-CM

## 2017-06-28 NOTE — Progress Notes (Signed)
Patient here today for follow up.  Patient states no new concerns today  

## 2017-06-28 NOTE — Progress Notes (Signed)
Hematology/Oncology Follow up note [facility] [phone] [phone]   Patient Care Team: Crecencio Mc, MD as PCP - General (Internal Medicine)  REFERRING PROVIDER: Crecencio Mc, MD CHIEF COMPLAINTS/PURPOSE OF CONSULTATION:  Evaluation of elevated hemoglobin.   HISTORY OF PRESENTING ILLNESS:  Samuel Riley is a  47 y.o.  male with PMH listed below who was referred to me for evaluation of elevated hemoglobin.  Recent CBC showed hemoglobin of 17.6, Hct 50.7. Former smoker, he quits in 2018. However, he admits to use cigar "off and on" to relieve stress. 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 occasionally he has day time somnolence.   INTERVAL HISTORY Xion Debruyne is a 47 y.o. male who has above history reviewed by me today presents for follow up visit for management of elevated hemoglobin.He has had labs done during the interval and presents to discuss results.  He denies any complaints today.   Review of Systems  Constitutional: Negative for chills, fever, malaise/fatigue and weight loss.  HENT: Negative for congestion, ear discharge, ear pain, hearing loss, nosebleeds, sinus pain and sore throat.   Eyes: Negative for double vision, photophobia, pain, discharge and redness.  Respiratory: Negative for cough, hemoptysis, sputum production, shortness of breath and wheezing.   Cardiovascular: Negative for chest pain, palpitations, orthopnea, claudication and leg swelling.  Gastrointestinal: Negative for abdominal pain, blood in stool, constipation, diarrhea, heartburn, melena, nausea and vomiting.  Genitourinary: Negative for dysuria, flank pain, frequency and hematuria.  Musculoskeletal: Negative for back pain, myalgias and neck pain.  Skin: Negative for itching and rash.  Neurological: Negative for dizziness, tingling, tremors, focal weakness, weakness and headaches.  Endo/Heme/Allergies: Negative for  environmental allergies. Does not bruise/bleed easily.  Psychiatric/Behavioral: Negative for depression and hallucinations. The patient is not nervous/anxious.     MEDICAL HISTORY:  Past Medical History:  Diagnosis Date  . Anxiety     SURGICAL HISTORY: Past Surgical History:  Procedure Laterality Date  . SPLENECTOMY, TOTAL N/A 2000   [facility]    SOCIAL HISTORY: Social History   Socioeconomic History  . Marital status: Divorced    Spouse name: Not on file  . Number of children: 1  . Years of education: Not on file  . Highest education level: Not on file  Occupational History  . Occupation: Armed forces operational officer  Social Needs  . Financial resource strain: Not on file  . Food insecurity:    Worry: Not on file    Inability: Not on file  . Transportation needs:    Medical: Not on file    Non-medical: Not on file  Tobacco Use  . Smoking status: Former Smoker    Packs/day: 1.00    Years: 25.00    Pack years: 25.00    Types: Cigarettes    Last attempt to quit: 03/29/2016    Years since quitting: 1.2  . Smokeless tobacco: Never Used  Substance and Sexual Activity  . Alcohol use: Yes    Alcohol/week: 0.0 oz    Comment: 4-6 gin drinks per week per pt  . Drug use: Yes    Types: Cocaine    Comment: none since 2000 per pt  . Sexual activity: Yes    Partners: Female    Birth control/protection: Condom  Lifestyle  . Physical activity:    Days per week: Not on file    Minutes per session: Not on file  . Stress: Not on file  Relationships  . Social connections:  Talks on phone: Not on file    Gets together: Not on file    Attends religious service: Not on file    Active member of club or organization: Not on file    Attends meetings of clubs or organizations: Not on file    Relationship status: Not on file  . Intimate partner violence:    Fear of current or ex partner: Not on file    Emotionally abused: Not on file    Physically abused: Not on file    Forced sexual activity:  Not on file  Other Topics Concern  . Not on file  Social History Narrative  . Not on file    FAMILY HISTORY: Family History  Problem Relation Age of Onset  . Hypertension Father   . Hypertension Brother     ALLERGIES:  has No Known Allergies.  MEDICATIONS:  Current Outpatient Medications  Medication Sig Dispense Refill  . ALPRAZolam (XANAX XR) 0.5 MG 24 hr tablet TAKE 1 TABLET BY MOUTH EVERY DAY 30 tablet 1  . celecoxib (CELEBREX) 400 MG capsule Take 1 capsule (400 mg total) by mouth daily after breakfast. 30 capsule 1  . cyclobenzaprine (FLEXERIL) 5 MG tablet TAKE 1 TABLET (5 MG TOTAL) BY MOUTH AT BEDTIME. 30 tablet 5  . finasteride (PROSCAR) 5 MG tablet Take by mouth daily.   6  . omeprazole (PRILOSEC) 20 MG capsule Take 20 mg by mouth daily.     Current Facility-Administered Medications  Medication Dose Route Frequency Provider Last Rate Last Dose  . lidocaine (XYLOCAINE) 2 % (with pres) injection 15 mg  0.75 mL Other Once Crecencio Mc, MD         PHYSICAL EXAMINATION: ECOG PERFORMANCE STATUS: 0 - Asymptomatic Vitals:   06/28/17 1347  BP: 138/90  Pulse: 73  Temp: (!) 96.5 F (35.8 C)   Filed Weights   06/28/17 1347  Weight: 171 lb 9 oz (77.8 kg)    Physical Exam  Constitutional: He is oriented to person, place, and time. He appears well-developed and well-nourished. No distress.  HENT:  Head: Normocephalic and atraumatic.  Right Ear: External ear normal.  Left Ear: External ear normal.  Nose: Nose normal.  Mouth/Throat: Oropharynx is clear and moist. No oropharyngeal exudate.  Eyes: Pupils are equal, round, and reactive to light. Conjunctivae and EOM are normal. Left eye exhibits no discharge. No scleral icterus.  Neck: Normal range of motion. Neck supple.  Cardiovascular: Normal rate, regular rhythm and normal heart sounds.  No murmur heard. Pulmonary/Chest: Effort normal and breath sounds normal. No stridor. No respiratory distress. He has no wheezes.  He has no rales. He exhibits no tenderness.  Abdominal: Soft. Bowel sounds are normal. He exhibits no distension and no mass. There is no tenderness.  Musculoskeletal: Normal range of motion. He exhibits no edema or deformity.  Lymphadenopathy:    He has no cervical adenopathy.  Neurological: He is alert and oriented to person, place, and time. No cranial nerve deficit. Coordination normal.  Skin: Skin is warm and dry. No rash noted.  Psychiatric: He has a normal mood and affect. His behavior is normal. Thought content normal.     LABORATORY DATA:  I have reviewed the data as listed Lab Results  Component Value Date   WBC 12.3 (H) 06/10/2017   HGB 17.6 (H) 06/10/2017   HCT 50.7 06/10/2017   MCV 101.7 (H) 06/10/2017   PLT 379.0 06/10/2017   Recent Labs    06/10/17 0754  NA 137  K 4.6  CL 103  CO2 28  GLUCOSE 104*  BUN 13  CREATININE 1.05  CALCIUM 9.2  PROT 6.9  ALBUMIN 4.0  AST 15  ALT 16  ALKPHOS 61  BILITOT 0.7       ASSESSMENT & PLAN:  1. Erythrocytosis   2. Tobacco use   3. History of splenectomy    # Lab results explained to patient.  JAK 2 V617, exon 12, CALR, MPL mutation negative. Normal TSH, LDH, normal erythropoietin.  given his history of tobacco use, erythrocytosis can be secondary to smoking.  Will check BCR-ABL, carbon monoxide level.   # Smoking cessation discussed in detail with patient.   # Patient has a history of splenectomy which can potentially cause thrombocytosis, really may cause erythrocytosis.  All questions were answered. The patient knows to call the clinic with any problems questions or concerns.  Return of visit: 6 months.  Thank you for this kind referral and the opportunity to participate in the care of this patient. A copy of today's note is routed to referring provider    Earlie Server, MD, PhD Hematology Oncology [facility] at [facility] Pager- [phone] 06/28/2017

## 2017-07-01 LAB — CARBON MONOXIDE, BLOOD (PERFORMED AT REF LAB): Carbon Monoxide, Blood: 5.7 % — ABNORMAL HIGH (ref 0.0–3.6)

## 2017-07-06 ENCOUNTER — Other Ambulatory Visit: Payer: Self-pay | Admitting: Internal Medicine

## 2017-07-12 LAB — BCR-ABL1 FISH
Cells Analyzed: 200
Cells Counted: 200

## 2017-07-19 ENCOUNTER — Other Ambulatory Visit: Payer: Self-pay | Admitting: Internal Medicine

## 2017-07-23 NOTE — Telephone Encounter (Signed)
Refilled: 05/08/2017 Last OV: 06/12/2017 Next OV: not scheduled

## 2017-07-23 NOTE — Telephone Encounter (Signed)
Printed, signed and faxed.  

## 2017-08-02 ENCOUNTER — Other Ambulatory Visit: Payer: Self-pay | Admitting: Internal Medicine

## 2017-08-14 ENCOUNTER — Other Ambulatory Visit: Payer: Self-pay | Admitting: Internal Medicine

## 2017-08-14 MED ORDER — PREDNISONE 10 MG PO TABS: ORAL_TABLET | ORAL | 0 refills | Status: DC

## 2017-08-14 MED ORDER — BENZONATATE 200 MG PO CAPS: 200.0000 mg | ORAL_CAPSULE | Freq: Three times a day (TID) | ORAL | 1 refills | Status: DC | PRN

## 2017-08-14 MED ORDER — LEVOFLOXACIN 500 MG PO TABS: 500.0000 mg | ORAL_TABLET | Freq: Every day | ORAL | 0 refills | Status: DC

## 2017-09-11 ENCOUNTER — Other Ambulatory Visit: Payer: Self-pay | Admitting: Internal Medicine

## 2017-12-18 ENCOUNTER — Other Ambulatory Visit: Payer: Self-pay | Admitting: Internal Medicine

## 2017-12-30 ENCOUNTER — Other Ambulatory Visit: Payer: Self-pay

## 2017-12-30 DIAGNOSIS — D751 Secondary polycythemia: Secondary | ICD-10-CM

## 2018-01-01 ENCOUNTER — Inpatient Hospital Stay: Payer: Self-pay | Admitting: Oncology

## 2018-01-01 ENCOUNTER — Inpatient Hospital Stay: Payer: Self-pay

## 2018-01-15 ENCOUNTER — Other Ambulatory Visit: Payer: Self-pay | Admitting: Internal Medicine

## 2018-01-15 MED ORDER — PREDNISONE 10 MG PO TABS: ORAL_TABLET | ORAL | 0 refills | Status: DC

## 2018-01-15 MED ORDER — AMOXICILLIN-POT CLAVULANATE 875-125 MG PO TABS: 1.0000 | ORAL_TABLET | Freq: Two times a day (BID) | ORAL | 0 refills | Status: DC

## 2018-02-03 ENCOUNTER — Other Ambulatory Visit: Payer: Self-pay | Admitting: Internal Medicine

## 2018-02-03 NOTE — Telephone Encounter (Signed)
Refilled: 07/23/2017 Last OV: 06/12/2017 Next OV: not scheduled

## 2018-04-11 ENCOUNTER — Other Ambulatory Visit: Payer: Self-pay | Admitting: Internal Medicine

## 2018-08-07 ENCOUNTER — Other Ambulatory Visit: Payer: Self-pay | Admitting: Internal Medicine

## 2018-08-08 NOTE — Telephone Encounter (Signed)
Refilled: 02/04/2018 Last OV: 06/12/2017 Next OV: 10/03/2018

## 2018-08-17 ENCOUNTER — Other Ambulatory Visit: Payer: Self-pay | Admitting: Internal Medicine

## 2018-10-02 ENCOUNTER — Other Ambulatory Visit: Payer: Self-pay

## 2018-10-03 ENCOUNTER — Other Ambulatory Visit: Payer: Self-pay

## 2018-10-03 ENCOUNTER — Ambulatory Visit (INDEPENDENT_AMBULATORY_CARE_PROVIDER_SITE_OTHER): Payer: No Typology Code available for payment source | Admitting: Internal Medicine

## 2018-10-03 ENCOUNTER — Encounter: Payer: Self-pay | Admitting: Internal Medicine

## 2018-10-03 VITALS — BP 130/80 | HR 89 | Temp 98.1°F | Resp 16 | Ht 70.0 in | Wt 174.8 lb

## 2018-10-03 DIAGNOSIS — E785 Hyperlipidemia, unspecified: Secondary | ICD-10-CM | POA: Diagnosis not present

## 2018-10-03 DIAGNOSIS — R5383 Other fatigue: Secondary | ICD-10-CM | POA: Diagnosis not present

## 2018-10-03 DIAGNOSIS — Z0000 Encounter for general adult medical examination without abnormal findings: Secondary | ICD-10-CM | POA: Diagnosis not present

## 2018-10-03 DIAGNOSIS — Z720 Tobacco use: Secondary | ICD-10-CM

## 2018-10-03 LAB — COMPREHENSIVE METABOLIC PANEL
ALT: 19 U/L (ref 0–53)
AST: 18 U/L (ref 0–37)
Albumin: 4.3 g/dL (ref 3.5–5.2)
Alkaline Phosphatase: 57 U/L (ref 39–117)
BUN: 11 mg/dL (ref 6–23)
CO2: 26 mEq/L (ref 19–32)
Calcium: 9.5 mg/dL (ref 8.4–10.5)
Chloride: 103 mEq/L (ref 96–112)
Creatinine, Ser: 0.9 mg/dL (ref 0.40–1.50)
GFR: 89.54 mL/min (ref >60.00)
Glucose, Bld: 100 mg/dL — ABNORMAL HIGH (ref 70–99)
Potassium: 4.2 mEq/L (ref 3.5–5.1)
Sodium: 138 mEq/L (ref 135–145)
Total Bilirubin: 0.5 mg/dL (ref 0.2–1.2)
Total Protein: 6.6 g/dL (ref 6.0–8.3)

## 2018-10-03 LAB — LIPID PANEL
Cholesterol: 203 mg/dL — ABNORMAL HIGH (ref 0–200)
HDL: 30.4 mg/dL — ABNORMAL LOW (ref >39.00)
NonHDL: 172.64
Total CHOL/HDL Ratio: 7
Triglycerides: 206 mg/dL — ABNORMAL HIGH (ref 0.0–149.0)
VLDL: 41.2 mg/dL — ABNORMAL HIGH (ref 0.0–40.0)

## 2018-10-03 LAB — TSH: TSH: 0.9 u[IU]/mL (ref 0.35–4.50)

## 2018-10-03 LAB — LDL CHOLESTEROL, DIRECT: Direct LDL: 141 mg/dL

## 2018-10-03 NOTE — Progress Notes (Signed)
Patient ID: Samuel Riley, male    DOB: Sep 09, 1970  Age: 48 y.o. MRN: [id number]  The patient is here for annual preventive  examination and management of other chronic and acute problems.   The risk factors are reflected in the social history.  The roster of all physicians providing medical care to patient - is listed in the Snapshot section of the chart.  Activities of daily living:  The patient is 100% independent in all ADLs: dressing, toileting, feeding as well as independent mobility  Home safety : The patient has smoke detectors in the home. They wear seatbelts.  There are no firearms at home. There is no violence in the home.   There is no risks for hepatitis, STDs or HIV. There is no   history of blood transfusion. They have no travel history to infectious disease endemic areas of the world.  The patient has seen their dentist in the last six month. They have seen their eye doctor in the last year. They admit to slight hearing difficulty with regard to whispered voices and some television programs.  They have deferred audiologic testing in the last year.  They do not  have excessive sun exposure. Discussed the need for sun protection: hats, long sleeves and use of sunscreen if there is significant sun exposure.   Diet: the importance of a healthy diet is discussed. They do have a healthy diet.  The benefits of regular aerobic exercise were discussed.He exercises 4 times per week ,  60 minutes.   Depression screen: there are no signs or vegative symptoms of depression- irritability, change in appetite, anhedonia, sadness/tearfullness.  Cognitive assessment: the patient manages all their financial and personal affairs and is actively engaged. They could relate day,date,year and events; recalled 2/3 objects at 3 minutes; performed clock-face test normally.  The following portions of the patient's history were reviewed and updated as appropriate: allergies, current medications, past  family history, past medical history,  past surgical history, past social history  and problem list.  Visual acuity was not assessed per patient preference since she has regular follow up with her ophthalmologist. Hearing and body mass index were assessed and reviewed.   During the course of the visit the patient was educated and counseled about appropriate screening and preventive services including : fall prevention , diabetes screening, nutrition counseling, colorectal cancer screening, and recommended immunizations.    CC: The primary encounter diagnosis was Encounter for preventive health examination. Diagnoses of Fatigue, unspecified type, Dyslipidemia, and Tobacco abuse were also pertinent to this visit.  History Samuel Riley has a past medical history of Anxiety.   He has a past surgical history that includes Splenectomy, total (N/A, 2000).   His family history includes Hypertension in his brother and father; Prostate cancer in his father.He reports that he quit smoking about 2 years ago. His smoking use included cigarettes. He has a 25.00 pack-year smoking history. He has never used smokeless tobacco. He reports current alcohol use. He reports current drug use. Drug: Cocaine.  Outpatient Medications Prior to Visit  Medication Sig Dispense Refill  . ALPRAZolam (XANAX XR) 0.5 MG 24 hr tablet TAKE 1 TABLET BY MOUTH EVERY DAY 30 tablet 2  . celecoxib (CELEBREX) 200 MG capsule TAKE 1 CAPSULE (200 MG TOTAL) BY MOUTH DAILY AFTER BREAKFAST. 30 capsule 3  . cyclobenzaprine (FLEXERIL) 5 MG tablet TAKE 1 TABLET (5 MG TOTAL) BY MOUTH AT BEDTIME. 30 tablet 5  . finasteride (PROSCAR) 5 MG tablet Take by mouth daily.  6  . omeprazole (PRILOSEC) 20 MG capsule Take 20 mg by mouth daily.    Marland Kitchen amoxicillin-clavulanate (AUGMENTIN) 875-125 MG tablet Take 1 tablet by mouth 2 (two) times daily. (Patient not taking: Reported on 10/03/2018) 14 tablet 0  . benzonatate (TESSALON) 200 MG capsule Take 1 capsule (200 mg  total) by mouth 3 (three) times daily as needed for cough. (Patient not taking: Reported on 10/03/2018) 60 capsule 1  . levofloxacin (LEVAQUIN) 500 MG tablet Take 1 tablet (500 mg total) by mouth daily. (Patient not taking: Reported on 10/03/2018) 7 tablet 0  . predniSONE (DELTASONE) 10 MG tablet 6 tablets on Day 1 , then reduce by 1 tablet daily until gone (Patient not taking: Reported on 10/03/2018) 21 tablet 0  . predniSONE (DELTASONE) 10 MG tablet 6 tablets on Day 1 , then reduce by 1 tablet daily until gone (Patient not taking: Reported on 10/03/2018) 21 tablet 0   Facility-Administered Medications Prior to Visit  Medication Dose Route Frequency Provider Last Rate Last Dose  . lidocaine (XYLOCAINE) 2 % (with pres) injection 15 mg  0.75 mL Other Once Crecencio Mc, MD        Review of Systems  Patient denies headache, fevers, malaise, unintentional weight loss, skin rash, eye pain, sinus congestion and sinus pain, sore throat, dysphagia,  hemoptysis , cough, dyspnea, wheezing, chest pain, palpitations, orthopnea, edema, abdominal pain, nausea, melena, diarrhea, constipation, flank pain, dysuria, hematuria, urinary  Frequency, nocturia, numbness, tingling, seizures,  Focal weakness, Loss of consciousness,  Tremor, insomnia, depression, anxiety, and suicidal ideation.     Objective:  BP 130/80 (BP Location: Left Arm, Patient Position: Sitting, Cuff Size: Normal)   Pulse 89   Temp 98.1 F (36.7 C) (Oral)   Resp 16   Ht 5\' 10"  (1.778 m)   Wt 174 lb 12.8 oz (79.3 kg)   SpO2 96%   BMI 25.08 kg/m   Physical Exam   General appearance: alert, cooperative and appears stated age Ears: normal TM's and external ear canals both ears Throat: lips, mucosa, and tongue normal; teeth and gums normal Neck: no adenopathy, no carotid bruit, supple, symmetrical, trachea midline and thyroid not enlarged, symmetric, no tenderness/mass/nodules Back: symmetric, no curvature. ROM normal. No CVA  tenderness. Lungs: clear to auscultation bilaterally Heart: regular rate and rhythm, S1, S2 normal, no murmur, click, rub or gallop Abdomen: soft, non-tender; bowel sounds normal; no masses,  no organomegaly Pulses: 2+ and symmetric Skin: Skin color, texture, turgor normal. No rashes or lesions Lymph nodes: Cervical, supraclavicular, and axillary nodes normal.    Assessment & Plan:   Problem List Items Addressed This Visit      Unprioritized   Tobacco abuse    He has reduced his use to 1-2 daily       Encounter for preventive health examination - Primary    age appropriate education and counseling updated, referrals for preventative services and immunizations addressed, dietary and smoking counseling addressed, most recent labs reviewed.  I have personally reviewed and have noted:  1) the patient's medical and social history 2) The pt's use of alcohol, tobacco, and illicit drugs 3) The patient's current medications and supplements 4) Functional ability including ADL's, fall risk, home safety risk, hearing and visual impairment 5) Diet and physical activities 6) Evidence for depression or mood disorder 7) The patient's height, weight, and BMI have been recorded in the chart  I have made referrals, and provided counseling and education based on review of the  above      Relevant Orders   Comprehensive metabolic panel (Completed)   Lipid panel (Completed)   Dyslipidemia    Based on current lipid profile and presence of tobacco abuse , the risk of clinically significant Coronary artery disease is  25% over the next 10 years, using the Framingham risk calculator,. Statin and tobacco cessation advised.   Lab Results  Component Value Date   CHOL 203 (H) 10/03/2018   HDL 30.40 (L) 10/03/2018   LDLCALC 148 (H) 05/18/2016   LDLDIRECT 141.0 10/03/2018   TRIG 206.0 (H) 10/03/2018   CHOLHDL 7 10/03/2018          Other Visit Diagnoses    Fatigue, unspecified type        Relevant Orders   TSH (Completed)   CBC w/Diff      I have discontinued Jeneen Rinks Levick's predniSONE, levofloxacin, benzonatate, amoxicillin-clavulanate, and predniSONE. I am also having him maintain his finasteride, omeprazole, cyclobenzaprine, ALPRAZolam, and celecoxib. We will continue to administer lidocaine.  No orders of the defined types were placed in this encounter.   Medications Discontinued During This Encounter  Medication Reason  . amoxicillin-clavulanate (AUGMENTIN) 875-125 MG tablet Error  . benzonatate (TESSALON) 200 MG capsule Error  . levofloxacin (LEVAQUIN) 500 MG tablet Error  . predniSONE (DELTASONE) 10 MG tablet Error  . predniSONE (DELTASONE) 10 MG tablet Error    Follow-up: No follow-ups on file.   Crecencio Mc, MD

## 2018-10-03 NOTE — Patient Instructions (Signed)

## 2018-10-05 NOTE — Assessment & Plan Note (Signed)

## 2018-10-05 NOTE — Assessment & Plan Note (Signed)
He has reduced his use to 1-2 daily

## 2018-10-05 NOTE — Assessment & Plan Note (Addendum)
Based on current lipid profile and presence of tobacco abuse , the risk of clinically significant Coronary artery disease is  25% over the next 10 years, using the Framingham risk calculator,. Statin and tobacco cessation advised.   Lab Results  Component Value Date   CHOL 203 (H) 10/03/2018   HDL 30.40 (L) 10/03/2018   LDLCALC 148 (H) 05/18/2016   LDLDIRECT 141.0 10/03/2018   TRIG 206.0 (H) 10/03/2018   CHOLHDL 7 10/03/2018

## 2018-12-28 ENCOUNTER — Other Ambulatory Visit: Payer: Self-pay | Admitting: Internal Medicine

## 2018-12-28 MED ORDER — BUPROPION HCL ER (XL) 150 MG PO TB24: 150.0000 mg | ORAL_TABLET | Freq: Every day | ORAL | 0 refills | Status: DC

## 2018-12-29 ENCOUNTER — Other Ambulatory Visit: Payer: Self-pay | Admitting: Internal Medicine

## 2018-12-30 NOTE — Telephone Encounter (Signed)
Refilled: 08/09/2018 Last OV: 10/03/2018 Next OV: not scheduled

## 2019-02-09 ENCOUNTER — Ambulatory Visit
Admission: RE | Admit: 2019-02-09 | Discharge: 2019-02-09 | Disposition: A | Discharge status: Home / Self Care | Payer: No Typology Code available for payment source | Source: Ambulatory Visit | Attending: Physical Medicine and Rehabilitation | Admitting: Physical Medicine and Rehabilitation

## 2019-02-09 ENCOUNTER — Other Ambulatory Visit: Payer: Self-pay

## 2019-02-09 ENCOUNTER — Other Ambulatory Visit: Payer: Self-pay | Admitting: Physical Medicine and Rehabilitation

## 2019-02-09 DIAGNOSIS — M170 Bilateral primary osteoarthritis of knee: Secondary | ICD-10-CM | POA: Diagnosis present

## 2019-02-09 DIAGNOSIS — M25462 Effusion, left knee: Secondary | ICD-10-CM | POA: Insufficient documentation

## 2019-02-16 ENCOUNTER — Other Ambulatory Visit: Payer: Self-pay | Admitting: Internal Medicine

## 2019-02-16 MED ORDER — BUPROPION HCL ER (XL) 300 MG PO TB24: 300.0000 mg | ORAL_TABLET | Freq: Every day | ORAL | 1 refills | Status: DC

## 2019-06-29 ENCOUNTER — Telehealth: Payer: Self-pay

## 2019-06-29 MED ORDER — ENSTILAR 0.005-0.064 % EX FOAM: 1.0000 "application " | Freq: Every day | CUTANEOUS | 2 refills | Status: DC

## 2019-06-29 NOTE — Telephone Encounter (Signed)
Patient called asking for refill of Enstilar. He was seen in January in [facility] system.   Prescription sent in to [facility].

## 2019-07-30 ENCOUNTER — Other Ambulatory Visit: Payer: Self-pay | Admitting: Internal Medicine

## 2019-08-04 ENCOUNTER — Other Ambulatory Visit: Payer: Self-pay | Admitting: Internal Medicine

## 2019-10-08 ENCOUNTER — Other Ambulatory Visit: Payer: Self-pay | Admitting: Internal Medicine

## 2019-10-08 DIAGNOSIS — J0110 Acute frontal sinusitis, unspecified: Secondary | ICD-10-CM

## 2019-10-08 MED ORDER — PREDNISONE 10 MG PO TABS: ORAL_TABLET | ORAL | 0 refills | Status: DC

## 2019-10-08 MED ORDER — AMOXICILLIN-POT CLAVULANATE 875-125 MG PO TABS: 1.0000 | ORAL_TABLET | Freq: Two times a day (BID) | ORAL | 0 refills | Status: DC

## 2019-10-08 NOTE — Assessment & Plan Note (Signed)
augmentin and prednisonee taper sent to [facility]

## 2020-01-20 ENCOUNTER — Other Ambulatory Visit: Payer: Self-pay | Admitting: Internal Medicine

## 2020-01-23 ENCOUNTER — Other Ambulatory Visit: Payer: Self-pay | Admitting: Internal Medicine

## 2020-02-06 ENCOUNTER — Other Ambulatory Visit: Payer: Self-pay | Admitting: Internal Medicine

## 2020-02-06 MED ORDER — BUPROPION HCL ER (XL) 150 MG PO TB24: 150.0000 mg | ORAL_TABLET | Freq: Every day | ORAL | 0 refills | Status: DC

## 2020-02-09 ENCOUNTER — Other Ambulatory Visit: Payer: Self-pay

## 2020-02-09 ENCOUNTER — Ambulatory Visit (INDEPENDENT_AMBULATORY_CARE_PROVIDER_SITE_OTHER): Payer: PRIVATE HEALTH INSURANCE | Admitting: Dermatology

## 2020-02-09 DIAGNOSIS — Z86018 Personal history of other benign neoplasm: Secondary | ICD-10-CM

## 2020-02-09 DIAGNOSIS — Z85828 Personal history of other malignant neoplasm of skin: Secondary | ICD-10-CM | POA: Diagnosis not present

## 2020-02-09 DIAGNOSIS — D485 Neoplasm of uncertain behavior of skin: Secondary | ICD-10-CM

## 2020-02-09 DIAGNOSIS — L578 Other skin changes due to chronic exposure to nonionizing radiation: Secondary | ICD-10-CM | POA: Diagnosis not present

## 2020-02-09 DIAGNOSIS — D492 Neoplasm of unspecified behavior of bone, soft tissue, and skin: Secondary | ICD-10-CM

## 2020-02-09 DIAGNOSIS — L821 Other seborrheic keratosis: Secondary | ICD-10-CM | POA: Diagnosis not present

## 2020-02-09 NOTE — Progress Notes (Signed)
   Follow-Up Visit   Subjective  Samuel Riley is a 49 y.o. male who presents for the following: Skin Problem (Check growth on crown of scalp x 2 months ) and Follow-up (Hx of Dysplastic nevus, Left low back paraspinal. Moderate atypia, remove 02/2019). Pt with a hx of BCC on his scalp concerned about new growths on his scalp. Pt declined a mole check and skin cancer screening today, he has not noticed any new spots.   The following portions of the chart were reviewed this encounter and updated as appropriate:   Tobacco  Allergies  Meds  Problems  Med Hx  Surg Hx  Fam Hx     Review of Systems:  No other skin or systemic complaints except as noted in HPI or Assessment and Plan.  Objective  Well appearing patient in no apparent distress; mood and affect are within normal limits.  A focused examination was performed including face,scalp . Relevant physical exam findings are noted in the Assessment and Plan.  Objective  L crown scalp: 0.4 cm Hyperkeratotic papules (4 papules in this area only one biopsied)  Objective  Left low back paraspinal, see history multiple sites:  Scar with no evidence of recurrence.   Objective  multiple sites see history: Well healed scar with no evidence of recurrence.    Assessment & Plan  Neoplasm of skin L crown scalp  Skin / nail biopsy Type of biopsy: tangential   Informed consent: discussed and consent obtained   Patient was prepped and draped in usual sterile fashion: area prepped with alochol. Anesthesia: the lesion was anesthetized in a standard fashion   Anesthetic:  1% lidocaine w/ epinephrine 1-100,000 buffered w/ 8.4% NaHCO3 Hemostasis achieved with: pressure, aluminum chloride and electrodesiccation   Outcome: patient tolerated procedure well   Post-procedure details: wound care instructions given   Post-procedure details comment:  Ointment and small bandage  Specimen 1 - Surgical pathology Differential Diagnosis: R/O Skin  cancer   Check Margins: No 0.4 cm Hyperkeratotic papules (4 papules in this area only one biopsied)  History of dysplastic nevus Left low back paraspinal, see history multiple sites  Clear. Observe for recurrence. Call clinic for new or changing lesions.  Recommend regular skin exams, daily broad-spectrum spf 30+ sunscreen use, and photoprotection.     History of basal cell carcinoma (BCC) multiple sites see history  Clear. Observe for recurrence. Call clinic for new or changing lesions.  Recommend regular skin exams, daily broad-spectrum spf 30+ sunscreen use, and photoprotection.    Pt declines skin cancer screening; mole check and total body skin exam today.  Actinic Damage - chronic, secondary to cumulative UV radiation exposure/sun exposure over time - diffuse scaly erythematous macules with underlying dyspigmentation - Recommend daily broad spectrum sunscreen SPF 30+ to sun-exposed areas, reapply every 2 hours as needed.  - Call for new or changing lesions.  Seborrheic Keratoses - Stuck-on, waxy, tan-brown papules and plaques  - Discussed benign etiology and prognosis. - Observe - Call for any changes  Return if symptoms worsen or fail to improve.  IMarye Round, CMA, am acting as scribe for Sarina Ser, MD .  Documentation: I have reviewed the above documentation for accuracy and completeness, and I agree with the above.  Sarina Ser, MD

## 2020-02-09 NOTE — Patient Instructions (Signed)

## 2020-02-11 ENCOUNTER — Telehealth: Payer: Self-pay

## 2020-02-11 NOTE — Telephone Encounter (Signed)
-----   Message from Ralene Bathe, MD sent at 02/10/2020  6:12 PM EST ----- Diagnosis Skin , L crown scalp PIGMENTED SEBORRHEIC KERATOSIS  Benign keratosis May schedule for treatment (LN2) Or May treat at next scheduled visit.

## 2020-02-11 NOTE — Telephone Encounter (Signed)
Left message on voicemail to return my call.  

## 2020-02-14 ENCOUNTER — Encounter: Payer: Self-pay | Admitting: Dermatology

## 2020-02-15 ENCOUNTER — Encounter: Payer: Self-pay | Admitting: Dermatology

## 2020-03-07 ENCOUNTER — Telehealth: Payer: Self-pay

## 2020-03-07 NOTE — Telephone Encounter (Signed)
-----   Message from David C Kowalski, MD sent at 02/10/2020  6:12 PM EST ----- Diagnosis Skin , L crown scalp PIGMENTED SEBORRHEIC KERATOSIS  Benign keratosis May schedule for treatment (LN2) Or May treat at next scheduled visit. 

## 2020-03-07 NOTE — Telephone Encounter (Signed)
Patient informed of pathology results and appointment scheduled for treatment.  

## 2020-03-22 ENCOUNTER — Encounter: Payer: Self-pay | Admitting: *Deleted

## 2020-06-12 ENCOUNTER — Other Ambulatory Visit: Payer: Self-pay | Admitting: Internal Medicine

## 2020-06-20 ENCOUNTER — Ambulatory Visit (INDEPENDENT_AMBULATORY_CARE_PROVIDER_SITE_OTHER): Payer: PRIVATE HEALTH INSURANCE | Admitting: Dermatology

## 2020-06-20 ENCOUNTER — Other Ambulatory Visit: Payer: Self-pay

## 2020-06-20 DIAGNOSIS — L820 Inflamed seborrheic keratosis: Secondary | ICD-10-CM | POA: Diagnosis not present

## 2020-06-20 NOTE — Patient Instructions (Signed)

## 2020-06-20 NOTE — Progress Notes (Signed)
   Follow-Up Visit   Subjective  Samuel Riley is a 50 y.o. male who presents for the following: Follow-up (Patient is here today to have biopsy proven spot on left scalp treated with Ln2. He denies concerns today with any new spots of concern. ).  The following portions of the chart were reviewed this encounter and updated as appropriate:  Tobacco  Allergies  Meds  Problems  Med Hx  Surg Hx  Fam Hx      Objective  Well appearing patient in no apparent distress; mood and affect are within normal limits.  A focused examination was performed including face, scalp. Relevant physical exam findings are noted in the Assessment and Plan.  Objective  left crown scalp x 1: Erythematous keratotic or waxy stuck-on papule or plaque.   Assessment & Plan  Inflamed seborrheic keratosis left crown scalp x 1 Biopsy proven  Destruction of lesion - left crown scalp x 1 Complexity: simple   Destruction method: cryotherapy   Informed consent: discussed and consent obtained   Timeout:  patient name, date of birth, surgical site, and procedure verified Lesion destroyed using liquid nitrogen: Yes   Region frozen until ice ball extended beyond lesion: Yes   Outcome: patient tolerated procedure well with no complications   Post-procedure details: wound care instructions given    Return for follow up as scheduled .  IRuthell Rummage, CMA, am acting as scribe for Sarina Ser, MD.  Documentation: I have reviewed the above documentation for accuracy and completeness, and I agree with the above.  Sarina Ser, MD

## 2020-06-23 ENCOUNTER — Encounter: Payer: Self-pay | Admitting: Dermatology

## 2020-11-05 ENCOUNTER — Other Ambulatory Visit: Payer: Self-pay | Admitting: Internal Medicine

## 2020-12-12 ENCOUNTER — Telehealth: Payer: Self-pay | Admitting: Internal Medicine

## 2020-12-12 NOTE — Telephone Encounter (Signed)
Please schedule Samuel Riley for an in office visit Tuesday,  oct 18 12:00

## 2020-12-13 ENCOUNTER — Other Ambulatory Visit: Payer: Self-pay

## 2020-12-13 ENCOUNTER — Ambulatory Visit (INDEPENDENT_AMBULATORY_CARE_PROVIDER_SITE_OTHER): Payer: PRIVATE HEALTH INSURANCE | Admitting: Internal Medicine

## 2020-12-13 ENCOUNTER — Encounter: Payer: Self-pay | Admitting: Internal Medicine

## 2020-12-13 VITALS — BP 140/102 | HR 83 | Temp 98.1°F | Ht 70.0 in | Wt 178.0 lb

## 2020-12-13 DIAGNOSIS — E785 Hyperlipidemia, unspecified: Secondary | ICD-10-CM

## 2020-12-13 DIAGNOSIS — Z125 Encounter for screening for malignant neoplasm of prostate: Secondary | ICD-10-CM | POA: Diagnosis not present

## 2020-12-13 DIAGNOSIS — Z1211 Encounter for screening for malignant neoplasm of colon: Secondary | ICD-10-CM

## 2020-12-13 DIAGNOSIS — Z23 Encounter for immunization: Secondary | ICD-10-CM

## 2020-12-13 DIAGNOSIS — E538 Deficiency of other specified B group vitamins: Secondary | ICD-10-CM | POA: Diagnosis not present

## 2020-12-13 DIAGNOSIS — F901 Attention-deficit hyperactivity disorder, predominantly hyperactive type: Secondary | ICD-10-CM

## 2020-12-13 MED ORDER — AMLODIPINE BESYLATE 2.5 MG PO TABS: 2.5000 mg | ORAL_TABLET | Freq: Every day | ORAL | 1 refills | Status: DC

## 2020-12-13 MED ORDER — ALPRAZOLAM ER 0.5 MG PO TB24: 0.5000 mg | ORAL_TABLET | Freq: Every day | ORAL | 2 refills | Status: DC | PRN

## 2020-12-13 MED ORDER — ATOMOXETINE HCL 10 MG PO CAPS: 10.0000 mg | ORAL_CAPSULE | Freq: Every day | ORAL | 1 refills | Status: DC

## 2020-12-13 NOTE — Progress Notes (Signed)
Subjective:  Patient ID: Samuel Riley, male    DOB: [birth year]  Age: 50 y.o. MRN: [id number]  CC: The primary encounter diagnosis was Need for immunization against influenza. Diagnoses of Colon cancer screening, Dyslipidemia, B12 deficiency, Prostate cancer screening, and Hyperactivity disorder, predominantly hyperactive-impulsive type were also pertinent to this visit.  HPI Harrington Jobe presents for management of mood disorder Chief Complaint  Patient presents with   Follow-up   This visit occurred during the SARS-CoV-2 public health emergency.  Safety protocols were in place, including screening questions prior to the visit, additional usage of staff PPE, and extensive cleaning of exam room while observing appropriate contact time as indicated for disinfecting solutions.   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 He runs a very large company and has noticed increased distractibility .  Does not want to use anything addicting he has had trials of SSRIs In the past with side effects were not tolerated .  He fins himself getting irritated due to    Outpatient Medications Prior to Visit  Medication Sig Dispense Refill   Calcipotriene-Betameth Diprop (ENSTILAR) 0.005-0.064 % FOAM Apply 1 application topically daily. 60 g 2   celecoxib (CELEBREX) 200 MG capsule TAKE 1 CAPSULE (200 MG TOTAL) BY MOUTH DAILY AFTER BREAKFAST. 30 capsule 3   finasteride (PROSCAR) 5 MG tablet Take by mouth daily.   6   omeprazole (PRILOSEC) 20 MG capsule Take 20 mg by mouth daily.     ALPRAZolam (XANAX XR) 0.5 MG 24 hr tablet TAKE 1 TABLET BY MOUTH EVERY DAY 30 tablet 2   amoxicillin-clavulanate (AUGMENTIN) 875-125 MG tablet Take 1 tablet by mouth 2 (two) times daily. 14 tablet 0   buPROPion (WELLBUTRIN XL) 150 MG 24 hr tablet Take 1 tablet (150 mg total) by mouth  daily. 30 tablet 0   cyclobenzaprine (FLEXERIL) 5 MG tablet TAKE 1 TABLET (5 MG TOTAL) BY MOUTH AT BEDTIME. 30 tablet 5   predniSONE (DELTASONE) 10 MG tablet 6 tablets on Day 1 , then reduce by 1 tablet daily until gone 21 tablet 0   Facility-Administered Medications Prior to Visit  Medication Dose Route Frequency Provider Last Rate Last Admin   lidocaine (XYLOCAINE) 2 % (with pres) injection 15 mg  0.75 mL Other Once Crecencio Mc, MD        Review of Systems;  Patient denies headache, fevers, malaise, unintentional weight loss, skin rash, eye pain, sinus congestion and sinus pain, sore throat, dysphagia,  hemoptysis , cough, dyspnea, wheezing, chest pain, palpitations, orthopnea, edema, abdominal pain, nausea, melena, diarrhea, constipation, flank pain, dysuria, hematuria, urinary  Frequency, nocturia, numbness, tingling, seizures,  Focal weakness, Loss of consciousness,  Tremor, insomnia, depression, anxiety, and suicidal ideation.      Objective:  BP (!) 140/102   Pulse 83   Temp 98.1 F (36.7 C) (Oral)   Ht 5\' 10"  (1.778 m)   Wt 178 lb (80.7 kg)   SpO2 97%   BMI 25.54 kg/m   BP Readings from Last 3 Encounters:  12/13/20 (!) 140/102  10/03/18 130/80  06/28/17 138/90    Wt Readings from Last 3 Encounters:  12/13/20 178 lb (80.7 kg)  10/03/18 174 lb 12.8 oz (79.3 kg)  06/28/17 171 lb 9 oz (77.8 kg)    General appearance: alert, cooperative and appears stated age Ears: normal TM's and external ear  canals both ears Throat: lips, mucosa, and tongue normal; teeth and gums normal Neck: no adenopathy, no carotid bruit, supple, symmetrical, trachea midline and thyroid not enlarged, symmetric, no tenderness/mass/nodules Back: symmetric, no curvature. ROM normal. No CVA tenderness. Lungs: clear to auscultation bilaterally Heart: regular rate and rhythm, S1, S2 normal, no murmur, click, rub or gallop Abdomen: soft, non-tender; bowel sounds normal; no masses,  no  organomegaly Pulses: 2+ and symmetric Skin: Skin color, texture, turgor normal. No rashes or lesions Lymph nodes: Cervical, supraclavicular, and axillary nodes normal.  Lab Results  Component Value Date   HGBA1C 5.2 06/10/2017   HGBA1C 5.5 05/21/2016    Lab Results  Component Value Date   CREATININE 0.90 10/03/2018   CREATININE 1.05 06/10/2017   CREATININE 0.92 05/18/2016    Lab Results  Component Value Date   WBC 12.3 (H) 06/10/2017   HGB 17.6 (H) 06/10/2017   HCT 50.7 06/10/2017   PLT 379.0 06/10/2017   GLUCOSE 100 (H) 10/03/2018   CHOL 203 (H) 10/03/2018   TRIG 206.0 (H) 10/03/2018   HDL 30.40 (L) 10/03/2018   LDLDIRECT 141.0 10/03/2018   LDLCALC 148 (H) 05/18/2016   ALT 19 10/03/2018   AST 18 10/03/2018   NA 138 10/03/2018   K 4.2 10/03/2018   CL 103 10/03/2018   CREATININE 0.90 10/03/2018   BUN 11 10/03/2018   CO2 26 10/03/2018   TSH 0.90 10/03/2018   HGBA1C 5.2 06/10/2017    Assessment & Plan:   Problem List Items Addressed This Visit     B12 deficiency    Found in 2017  During screen for anemia.  Continue monthly injections      Relevant Orders   Vitamin B12   Dyslipidemia   Relevant Orders   Comprehensive metabolic panel   Lipid panel   TSH   Hyperactivity disorder, predominantly hyperactive-impulsive type    He prefers to avoid stimulants and controlled substances. Trial of Straterra starting at 10 mg (lowest dose)      Other Visit Diagnoses     Need for immunization against influenza    -  Primary   Relevant Orders   Flu Vaccine QUAD 49mo+IM (Fluarix, Fluzone & Alfiuria Quad PF) (Completed)   Colon cancer screening       Relevant Orders   Ambulatory referral to Gastroenterology   Prostate cancer screening       Relevant Orders   PSA       Meds ordered this encounter  Medications   atomoxetine (STRATTERA) 10 MG capsule    Sig: Take 1 capsule (10 mg total) by mouth daily.    Dispense:  90 capsule    Refill:  1   amLODipine  (NORVASC) 2.5 MG tablet    Sig: Take 1 tablet (2.5 mg total) by mouth daily.    Dispense:  90 tablet    Refill:  1   ALPRAZolam (XANAX XR) 0.5 MG 24 hr tablet    Sig: Take 1 tablet (0.5 mg total) by mouth daily as needed for anxiety.    Dispense:  30 tablet    Refill:  2    Not to exceed 3 additional fills before 02/05/2019.    Medications Discontinued During This Encounter  Medication Reason   ALPRAZolam (XANAX XR) 0.5 MG 24 hr tablet Patient has not taken in last 30 days   amoxicillin-clavulanate (AUGMENTIN) 875-125 MG tablet Patient has not taken in last 30 days   buPROPion (WELLBUTRIN XL) 150 MG 24 hr tablet  Patient has not taken in last 30 days   cyclobenzaprine (FLEXERIL) 5 MG tablet Patient has not taken in last 30 days   predniSONE (DELTASONE) 10 MG tablet Patient has not taken in last 30 days     30 minutes  Was spent  reviewing patient's current problems and recent encounters with his specialists, reviewing and ordering  labs and imaging studies, providing counseling on the above mentioned problems , and evaluating patient  In a face to face visit  .   Follow-up: No follow-ups on file.   Crecencio Mc, MD

## 2020-12-13 NOTE — Assessment & Plan Note (Addendum)
He prefers to avoid stimulants and controlled substances. Trial of Straterra starting at 10 mg (lowest dose)

## 2020-12-14 NOTE — Assessment & Plan Note (Signed)
Found in 2017  During screen for anemia.  Continue monthly injections

## 2020-12-28 ENCOUNTER — Other Ambulatory Visit: Payer: Self-pay

## 2020-12-28 ENCOUNTER — Other Ambulatory Visit (INDEPENDENT_AMBULATORY_CARE_PROVIDER_SITE_OTHER): Payer: PRIVATE HEALTH INSURANCE

## 2020-12-28 ENCOUNTER — Other Ambulatory Visit: Payer: Self-pay | Admitting: Internal Medicine

## 2020-12-28 DIAGNOSIS — E785 Hyperlipidemia, unspecified: Secondary | ICD-10-CM | POA: Diagnosis not present

## 2020-12-28 DIAGNOSIS — R7401 Elevation of levels of liver transaminase levels: Secondary | ICD-10-CM

## 2020-12-28 LAB — COMPREHENSIVE METABOLIC PANEL
ALT: 52 U/L (ref 0–53)
AST: 65 U/L — ABNORMAL HIGH (ref 0–37)
Albumin: 4.5 g/dL (ref 3.5–5.2)
Alkaline Phosphatase: 82 U/L (ref 39–117)
BUN: 6 mg/dL (ref 6–23)
CO2: 31 mEq/L (ref 19–32)
Calcium: 9.6 mg/dL (ref 8.4–10.5)
Chloride: 96 mEq/L (ref 96–112)
Creatinine, Ser: 0.91 mg/dL (ref 0.40–1.50)
GFR: 97.5 mL/min (ref >60.00)
Glucose, Bld: 111 mg/dL — ABNORMAL HIGH (ref 70–99)
Potassium: 4.2 mEq/L (ref 3.5–5.1)
Sodium: 135 mEq/L (ref 135–145)
Total Bilirubin: 1.5 mg/dL — ABNORMAL HIGH (ref 0.2–1.2)
Total Protein: 7.1 g/dL (ref 6.0–8.3)

## 2020-12-28 LAB — LIPID PANEL
Cholesterol: 176 mg/dL (ref 0–200)
HDL: 33.4 mg/dL — ABNORMAL LOW (ref >39.00)
Total CHOL/HDL Ratio: 5
Triglycerides: 497 mg/dL — ABNORMAL HIGH (ref 0.0–149.0)

## 2020-12-28 LAB — LDL CHOLESTEROL, DIRECT: Direct LDL: 82 mg/dL

## 2020-12-28 LAB — TSH: TSH: 1.6 u[IU]/mL (ref 0.35–5.50)

## 2020-12-29 ENCOUNTER — Telehealth: Payer: Self-pay | Admitting: Internal Medicine

## 2020-12-29 NOTE — Telephone Encounter (Signed)
Lft pt vm to call ofc to sch [facility]. thanks ?

## 2020-12-30 ENCOUNTER — Telehealth: Payer: Self-pay | Admitting: Internal Medicine

## 2020-12-30 NOTE — Telephone Encounter (Signed)
Lft pt vm to call ofc to sch [facility]. thanks ?

## 2021-01-06 ENCOUNTER — Other Ambulatory Visit: Payer: Self-pay

## 2021-01-06 DIAGNOSIS — Z1211 Encounter for screening for malignant neoplasm of colon: Secondary | ICD-10-CM

## 2021-01-06 MED ORDER — SUTAB 1479-225-188 MG PO TABS: 1.0000 | ORAL_TABLET | ORAL | 0 refills | Status: DC

## 2021-01-09 ENCOUNTER — Telehealth: Payer: Self-pay

## 2021-01-09 MED ORDER — ENSTILAR 0.005-0.064 % EX FOAM: 1.0000 "application " | Freq: Every day | CUTANEOUS | 2 refills | Status: DC

## 2021-01-09 NOTE — Telephone Encounter (Signed)
Pt called for refill of Enstilar foam,  Ok erx'd [employer] foam to [facility]

## 2021-01-11 ENCOUNTER — Ambulatory Visit: Payer: PRIVATE HEALTH INSURANCE

## 2021-01-18 ENCOUNTER — Ambulatory Visit
Admission: RE | Admit: 2021-01-18 | Discharge: 2021-01-18 | Disposition: A | Discharge status: Home / Self Care | Payer: PRIVATE HEALTH INSURANCE | Source: Ambulatory Visit | Attending: Internal Medicine | Admitting: Internal Medicine

## 2021-01-18 DIAGNOSIS — R7401 Elevation of levels of liver transaminase levels: Secondary | ICD-10-CM | POA: Diagnosis not present

## 2021-01-20 ENCOUNTER — Other Ambulatory Visit: Payer: Self-pay | Admitting: Internal Medicine

## 2021-01-20 DIAGNOSIS — E785 Hyperlipidemia, unspecified: Secondary | ICD-10-CM

## 2021-01-20 DIAGNOSIS — R7401 Elevation of levels of liver transaminase levels: Secondary | ICD-10-CM

## 2021-02-09 ENCOUNTER — Ambulatory Visit (INDEPENDENT_AMBULATORY_CARE_PROVIDER_SITE_OTHER): Payer: PRIVATE HEALTH INSURANCE | Admitting: Dermatology

## 2021-02-09 ENCOUNTER — Encounter: Payer: PRIVATE HEALTH INSURANCE | Admitting: Dermatology

## 2021-02-09 ENCOUNTER — Other Ambulatory Visit: Payer: Self-pay

## 2021-02-09 DIAGNOSIS — Z86018 Personal history of other benign neoplasm: Secondary | ICD-10-CM

## 2021-02-09 DIAGNOSIS — L814 Other melanin hyperpigmentation: Secondary | ICD-10-CM

## 2021-02-09 DIAGNOSIS — L738 Other specified follicular disorders: Secondary | ICD-10-CM

## 2021-02-09 DIAGNOSIS — Z85828 Personal history of other malignant neoplasm of skin: Secondary | ICD-10-CM

## 2021-02-09 DIAGNOSIS — L570 Actinic keratosis: Secondary | ICD-10-CM | POA: Diagnosis not present

## 2021-02-09 DIAGNOSIS — L821 Other seborrheic keratosis: Secondary | ICD-10-CM

## 2021-02-09 DIAGNOSIS — Z1283 Encounter for screening for malignant neoplasm of skin: Secondary | ICD-10-CM

## 2021-02-09 DIAGNOSIS — L578 Other skin changes due to chronic exposure to nonionizing radiation: Secondary | ICD-10-CM

## 2021-02-09 DIAGNOSIS — D1800 Hemangioma unspecified site: Secondary | ICD-10-CM

## 2021-02-09 DIAGNOSIS — D229 Melanocytic nevi, unspecified: Secondary | ICD-10-CM

## 2021-02-09 NOTE — Patient Instructions (Signed)

## 2021-02-09 NOTE — Progress Notes (Signed)
Follow-Up Visit   Subjective  Samuel Riley is a 50 y.o. male who presents for the following: Annual Exam (History of BCC and dysplastic nevi - TBSE today). The patient presents for Total-Body Skin Exam (TBSE) for skin cancer screening and mole check.  The patient has spots, moles and lesions to be evaluated, some may be new or changing and the patient has concerns that these could be cancer.  The following portions of the chart were reviewed this encounter and updated as appropriate:   Tobacco   Allergies   Meds   Problems   Med Hx   Surg Hx   Fam Hx      Review of Systems:  No other skin or systemic complaints except as noted in HPI or Assessment and Plan.  Objective  Well appearing patient in no apparent distress; mood and affect are within normal limits.  A full examination was performed including scalp, head, eyes, ears, nose, lips, neck, chest, axillae, abdomen, back, buttocks, bilateral upper extremities, bilateral lower extremities, hands, feet, fingers, toes, fingernails, and toenails. All findings within normal limits unless otherwise noted below.  Face Yellow papules  Right crown Erythematous thin papules/macules with gritty scale.    Assessment & Plan   History of Basal Cell Carcinoma of the Skin - No evidence of recurrence today - Recommend regular full body skin exams - Recommend daily broad spectrum sunscreen SPF 30+ to sun-exposed areas, reapply every 2 hours as needed.  - Call if any new or changing lesions are noted between office visits  History of Dysplastic Nevi - No evidence of recurrence today - Recommend regular full body skin exams - Recommend daily broad spectrum sunscreen SPF 30+ to sun-exposed areas, reapply every 2 hours as needed.  - Call if any new or changing lesions are noted between office visits  Lentigines - Scattered tan macules - Due to sun exposure - Benign-appearing, observe - Recommend daily broad spectrum sunscreen SPF 30+ to  sun-exposed areas, reapply every 2 hours as needed. - Call for any changes  Seborrheic Keratoses - Stuck-on, waxy, tan-brown papules and/or plaques  - Benign-appearing - Discussed benign etiology and prognosis. - Observe - Call for any changes  Melanocytic Nevi - Tan-brown and/or pink-flesh-colored symmetric macules and papules - Benign appearing on exam today - Observation - Call clinic for new or changing moles - Recommend daily use of broad spectrum spf 30+ sunscreen to sun-exposed areas.   Hemangiomas - Red papules - Discussed benign nature - Observe - Call for any changes  Actinic Damage - Chronic condition, secondary to cumulative UV/sun exposure - diffuse scaly erythematous macules with underlying dyspigmentation - Recommend daily broad spectrum sunscreen SPF 30+ to sun-exposed areas, reapply every 2 hours as needed.  - Staying in the shade or wearing long sleeves, sun glasses (UVA+UVB protection) and wide brim hats (4-inch brim around the entire circumference of the hat) are also recommended for sun protection.  - Call for new or changing lesions.  Skin cancer screening performed today.  Sebaceous hyperplasia Face Benign-appearing.  Observation.  Call clinic for new or changing lesions.  Recommend daily use of broad spectrum spf 30+ sunscreen to sun-exposed areas.   AK (actinic keratosis) Right crown scalp Destruction of lesion - Right crown Complexity: simple   Destruction method: cryotherapy   Informed consent: discussed and consent obtained   Timeout:  patient name, date of birth, surgical site, and procedure verified Lesion destroyed using liquid nitrogen: Yes   Region frozen until  ice ball extended beyond lesion: Yes   Outcome: patient tolerated procedure well with no complications   Post-procedure details: wound care instructions given    Skin cancer screening  Return in about 6 months (around 08/10/2021).  I, Ashok Cordia, CMA, am acting as scribe for  Sarina Ser, MD . Documentation: I have reviewed the above documentation for accuracy and completeness, and I agree with the above.  Sarina Ser, MD

## 2021-02-13 ENCOUNTER — Encounter: Payer: Self-pay | Admitting: Dermatology

## 2021-03-03 ENCOUNTER — Other Ambulatory Visit: Payer: Self-pay | Admitting: Internal Medicine

## 2021-03-10 ENCOUNTER — Encounter: Payer: Self-pay | Admitting: Internal Medicine

## 2021-03-10 DIAGNOSIS — F1010 Alcohol abuse, uncomplicated: Secondary | ICD-10-CM | POA: Insufficient documentation

## 2021-03-17 ENCOUNTER — Encounter: Payer: Self-pay | Admitting: Gastroenterology

## 2021-03-17 ENCOUNTER — Ambulatory Visit: Payer: PRIVATE HEALTH INSURANCE | Admitting: Anesthesiology

## 2021-03-17 ENCOUNTER — Ambulatory Visit
Admission: RE | Admit: 2021-03-17 | Discharge: 2021-03-17 | Disposition: A | Discharge status: Home / Self Care | Payer: PRIVATE HEALTH INSURANCE | Source: Ambulatory Visit | Attending: Gastroenterology | Admitting: Gastroenterology

## 2021-03-17 ENCOUNTER — Other Ambulatory Visit: Payer: Self-pay

## 2021-03-17 ENCOUNTER — Encounter
Admission: RE | Disposition: A | Discharge status: Home / Self Care | Payer: Self-pay | Source: Ambulatory Visit | Attending: Gastroenterology

## 2021-03-17 DIAGNOSIS — K635 Polyp of colon: Secondary | ICD-10-CM | POA: Diagnosis not present

## 2021-03-17 DIAGNOSIS — Z1211 Encounter for screening for malignant neoplasm of colon: Secondary | ICD-10-CM | POA: Diagnosis not present

## 2021-03-17 DIAGNOSIS — D125 Benign neoplasm of sigmoid colon: Secondary | ICD-10-CM | POA: Insufficient documentation

## 2021-03-17 DIAGNOSIS — M1990 Unspecified osteoarthritis, unspecified site: Secondary | ICD-10-CM | POA: Insufficient documentation

## 2021-03-17 DIAGNOSIS — F17210 Nicotine dependence, cigarettes, uncomplicated: Secondary | ICD-10-CM | POA: Diagnosis not present

## 2021-03-17 DIAGNOSIS — D122 Benign neoplasm of ascending colon: Secondary | ICD-10-CM | POA: Diagnosis not present

## 2021-03-17 HISTORY — PX: COLONOSCOPY WITH PROPOFOL: SHX5780

## 2021-03-17 SURGERY — COLONOSCOPY WITH PROPOFOL
Anesthesia: General

## 2021-03-17 MED ORDER — PROPOFOL 500 MG/50ML IV EMUL
INTRAVENOUS | Status: DC | PRN
  Administered 2021-03-17: 160 ug/kg/min via INTRAVENOUS

## 2021-03-17 MED ORDER — PHENYLEPHRINE HCL-NACL 20-0.9 MG/250ML-% IV SOLN
INTRAVENOUS | Status: AC
  Filled 2021-03-17: qty 250

## 2021-03-17 MED ORDER — PROPOFOL 10 MG/ML IV BOLUS
INTRAVENOUS | Status: AC
  Filled 2021-03-17: qty 20

## 2021-03-17 MED ORDER — DEXMEDETOMIDINE HCL IN NACL 200 MCG/50ML IV SOLN
INTRAVENOUS | Status: DC | PRN
Start: 2021-03-17 — End: 2021-03-17
  Administered 2021-03-17: 12 ug via INTRAVENOUS

## 2021-03-17 MED ORDER — PROPOFOL 10 MG/ML IV BOLUS
INTRAVENOUS | Status: DC | PRN
  Administered 2021-03-17: 100 mg via INTRAVENOUS

## 2021-03-17 MED ORDER — SODIUM CHLORIDE 0.9 % IV SOLN: INTRAVENOUS | Status: DC

## 2021-03-17 NOTE — Anesthesia Postprocedure Evaluation (Signed)
Anesthesia Post Note  Patient: Samuel Riley  Procedure(s) Performed: COLONOSCOPY WITH PROPOFOL  Patient location during evaluation: Endoscopy Anesthesia Type: General Level of consciousness: awake and alert Pain management: pain level controlled Vital Signs Assessment: post-procedure vital signs reviewed and stable Respiratory status: spontaneous breathing, nonlabored ventilation, respiratory function stable and patient connected to nasal cannula oxygen Cardiovascular status: blood pressure returned to baseline and stable Postop Assessment: no apparent nausea or vomiting Anesthetic complications: no   No notable events documented.   Last Vitals:  Vitals:   03/17/21 1200 03/17/21 1210  BP: 125/85 (!) 136/107  Pulse: 69 70  Resp: 16 20  Temp:    SpO2: 99% 98%    Last Pain:  Vitals:   03/17/21 1210  TempSrc:   PainSc: 0-No pain                 Precious Haws Seletha Zimmermann

## 2021-03-17 NOTE — Transfer of Care (Signed)
Immediate Anesthesia Transfer of Care Note  Patient: Samuel Riley  Procedure(s) Performed: COLONOSCOPY WITH PROPOFOL  Patient Location: PACU  Anesthesia Type:General  Level of Consciousness: sedated  Airway & Oxygen Therapy: Patient Spontanous Breathing and Patient connected to nasal cannula oxygen  Post-op Assessment: Report given to RN and Post -op Vital signs reviewed and stable  Post vital signs: Reviewed and stable  Last Vitals:  Vitals Value Taken Time  BP 116/72 03/17/21 1150  Temp 36.7 C 03/17/21 1150  Pulse 69 03/17/21 1150  Resp 20 03/17/21 1150  SpO2 98 % 03/17/21 1150    Last Pain:  Vitals:   03/17/21 1150  TempSrc: Temporal  PainSc: Asleep         Complications: No notable events documented.

## 2021-03-17 NOTE — Progress Notes (Signed)
Patient delayed 2 hours for drinking and chewing gum on arrival

## 2021-03-17 NOTE — Op Note (Signed)
[facility] Gastroenterology Patient Name: Samuel Riley Procedure Date: 03/17/2021 11:26 AM MRN: [id number] Account #: [id number] Date of Birth: [birth year] Admit Type: Outpatient Age: 51 Room: [facility] ENDO ROOM 4 Gender: Male Note Status: Finalized Instrument Name: Park Meo [id number] Procedure:             Colonoscopy Indications:           Screening for colorectal malignant neoplasm Providers:             Jonathon Bellows MD, MD Referring MD:          Deborra Medina, MD (Referring MD) Medicines:             Monitored Anesthesia Care Complications:         No immediate complications. Procedure:             Pre-Anesthesia Assessment:                        - Prior to the procedure, a History and Physical was                         performed, and patient medications, allergies and                         sensitivities were reviewed. The patient's tolerance                         of previous anesthesia was reviewed.                        - The risks and benefits of the procedure and the                         sedation options and risks were discussed with the                         patient. All questions were answered and informed                         consent was obtained.                        - ASA Grade Assessment: II - A patient with mild                         systemic disease.                        After obtaining informed consent, the colonoscope was                         passed under direct vision. Throughout the procedure,                         the patient's blood pressure, pulse, and oxygen                         saturations were monitored continuously. The                         Colonoscope was introduced  through the anus and                         advanced to the the cecum, identified by the                         appendiceal orifice. The colonoscopy was performed                         without difficulty. The patient tolerated the                          procedure well. The quality of the bowel preparation                         was adequate. Findings:      The perianal and digital rectal examinations were normal.      A 8 mm polyp was found in the sigmoid colon. The polyp was sessile. The       polyp was removed with a cold snare. Resection and retrieval were       complete.      Two sessile polyps were found in the sigmoid colon. The polyps were 4 to       5 mm in size. These polyps were removed with a cold snare. Resection and       retrieval were complete. Impression:            - One 4 mm polyp in the sigmoid colon, removed with a                         cold snare. Resected and retrieved.                        - Two 4 to 5 mm polyps in the sigmoid colon, removed                         with a cold snare. Resected and retrieved. Recommendation:        - Await pathology results.                        - Discharge patient to home (with escort).                        - Resume previous diet.                        - Continue present medications.                        - Repeat colonoscopy in 3 years for surveillance. Procedure Code(s):     --- Professional ---                        [phone], Colonoscopy, flexible; with removal of                         tumor(s), polyp(s), or other lesion(s) by snare  technique Diagnosis Code(s):     --- Professional ---                        K63.5, Polyp of colon                        Z12.11, Encounter for screening for malignant neoplasm                         of colon CPT copyright 2019 [employer]. All rights reserved. The codes documented in this report are preliminary and upon coder review may  be revised to meet current compliance requirements. Jonathon Bellows, MD Jonathon Bellows MD, MD 03/17/2021 11:49:49 AM This report has been signed electronically. Number of Addenda: 0 Note Initiated On: 03/17/2021 11:26 AM Scope Withdrawal Time: 0 hours  10 minutes 30 seconds  Total Procedure Duration: 0 hours 13 minutes 10 seconds  Estimated Blood Loss:  Estimated blood loss: none.      [facility]

## 2021-03-17 NOTE — Anesthesia Preprocedure Evaluation (Signed)
Anesthesia Evaluation  Patient identified by MRN, date of birth, ID band Patient awake    Reviewed: Allergy & Precautions, NPO status , Patient's Chart, lab work & pertinent test results  History of Anesthesia Complications Negative for: history of anesthetic complications  Airway Mallampati: III  TM Distance: >3 FB Neck ROM: full    Dental  (+) Chipped   Pulmonary neg shortness of breath, Current Smoker and Patient abstained from smoking.,    Pulmonary exam normal        Cardiovascular (-) anginanegative cardio ROS Normal cardiovascular exam     Neuro/Psych PSYCHIATRIC DISORDERS negative neurological ROS  negative psych ROS   GI/Hepatic negative GI ROS, Neg liver ROS, neg GERD  ,  Endo/Other  negative endocrine ROS  Renal/GU negative Renal ROS  negative genitourinary   Musculoskeletal  (+) Arthritis ,   Abdominal   Peds  Hematology negative hematology ROS (+)   Anesthesia Other Findings Past Medical History: No date: Anxiety 02/10/2015: Atypical mole     Comment:  Right supra auricular scalp. Mild atypia, deep margin               involved.  03/14/2006: Basal cell carcinoma     Comment:  Mid vertex scalp.  03/26/2007: Basal cell carcinoma     Comment:  Vertex scalp.  01/11/2010: Basal cell carcinoma     Comment:  Scalp.  11/05/2013: Basal cell carcinoma     Comment:  Left anterior deltoid. Superficial. EDC. 10/20/2014: Basal cell carcinoma     Comment:  Left superior pectoral. Nodular. 03/06/2017: Dysplastic nevus     Comment:  Right proximal ant. lat. thigh. Mild atypia, limited               margins free. 03/06/2017: Dysplastic nevus     Comment:  Right mid ant. thigh. Mild atypia, limited margins free. 03/24/2019: Dysplastic nevus     Comment:  Left low back paraspinal. Moderate atypia, close to               margin.  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, TOTAL; N/A     Comment:   [facility]     Reproductive/Obstetrics negative OB ROS                             Anesthesia Physical Anesthesia Plan  ASA: 2  Anesthesia Plan: General   Post-op Pain Management:    Induction: Intravenous  PONV Risk Score and Plan: Propofol infusion and TIVA  Airway Management Planned: Natural Airway and Nasal Cannula  Additional Equipment:   Intra-op Plan:   Post-operative Plan:   Informed Consent: I have reviewed the patients History and Physical, chart, labs and discussed the procedure including the risks, benefits and alternatives for the proposed anesthesia with the patient or authorized representative who has indicated his/her understanding and acceptance.     Dental Advisory Given  Plan Discussed with: Anesthesiologist, CRNA and Surgeon  Anesthesia Plan Comments: (Patient consented for risks of anesthesia including but not limited to:  - adverse reactions to medications - risk of airway placement if required - damage to eyes, teeth, lips or other oral mucosa - nerve damage due to positioning  - sore throat or hoarseness - Damage to heart, brain, nerves, lungs, other parts of body or loss of life  Patient voiced understanding.)        Anesthesia Quick Evaluation

## 2021-03-17 NOTE — H&P (Signed)
Jonathon Bellows, MD [address], [place], [place], [place], [address] [address], [place], [place], [place], [address] Phone: [phone]  Fax: [phone]  Primary Care Physician:  Crecencio Mc, MD   Pre-Procedure History & Physical: HPI:  Samuel Riley is a 51 y.o. male is here for an colonoscopy.   Past Medical History:  Diagnosis Date   Anxiety    Atypical mole 02/10/2015   Right supra auricular scalp. Mild atypia, deep margin involved.    Basal cell carcinoma 03/14/2006   Mid vertex scalp.    Basal cell carcinoma 03/26/2007   Vertex scalp.    Basal cell carcinoma 01/11/2010   Scalp.    Basal cell carcinoma 11/05/2013   Left anterior deltoid. Superficial. EDC.   Basal cell carcinoma 10/20/2014   Left superior pectoral. Nodular.   Dysplastic nevus 03/06/2017   Right proximal ant. lat. thigh. Mild atypia, limited margins free.   Dysplastic nevus 03/06/2017   Right mid ant. thigh. Mild atypia, limited margins free.   Dysplastic nevus 03/24/2019   Left low back paraspinal. Moderate atypia, close to margin.    Past Surgical History:  Procedure Laterality Date   SPLENECTOMY, TOTAL N/A 2000   [facility]    Prior to Admission medications   Medication Sig Start Date End Date Taking? Authorizing Provider  amLODipine (NORVASC) 2.5 MG tablet Take 1 tablet (2.5 mg total) by mouth daily. 12/13/20  Yes Crecencio Mc, MD  celecoxib (CELEBREX) 200 MG capsule TAKE 1 CAPSULE (200 MG TOTAL) BY MOUTH DAILY AFTER BREAKFAST. 03/03/21  Yes Crecencio Mc, MD  omeprazole (PRILOSEC) 20 MG capsule Take 20 mg by mouth daily.   Yes [provider]  ALPRAZolam (XANAX XR) 0.5 MG 24 hr tablet Take 1 tablet (0.5 mg total) by mouth daily as needed for anxiety. 12/13/20   Crecencio Mc, MD  atomoxetine (STRATTERA) 10 MG capsule Take 1 capsule (10 mg total) by mouth daily. 12/13/20   Crecencio Mc, MD  Calcipotriene-Betameth Diprop (ENSTILAR) 0.005-0.064 % FOAM Apply 1  application topically daily. 01/09/21   Ralene Bathe, MD  finasteride (PROSCAR) 5 MG tablet Take by mouth daily.  05/16/15   [provider]  Sodium Sulfate-Mag Sulfate-KCl (SUTAB) [phone] MG TABS Take 1 kit by mouth as directed. 01/06/21   Lucilla Lame, MD    Allergies as of 01/06/2021   (No Known Allergies)    Family History  Problem Relation Age of Onset   Hypertension Father    Prostate cancer Father    Hypertension Brother     Social History   Socioeconomic History   Marital status: Divorced    Spouse name: Not on file   Number of children: 1   Years of education: Not on file   Highest education level: Not on file  Occupational History   Occupation: business owner  Tobacco Use   Smoking status: Some Days    Packs/day: 1.00    Years: 25.00    Pack years: 25.00    Types: Cigarettes    Last attempt to quit: 03/29/2016    Years since quitting: 4.9   Smokeless tobacco: Never  Vaping Use   Vaping Use: Never used  Substance and Sexual Activity   Alcohol use: Yes    Alcohol/week: 0.0 standard drinks    Comment: 4-6 gin drinks per week per pt   Drug use: Yes    Types: Cocaine    Comment: none since 2000 per pt  Sexual activity: Yes    Partners: Female    Birth control/protection: Condom  Other Topics Concern   Not on file  Social History Narrative   Not on file   Social Determinants of Health   Financial Resource Strain: Not on file  Food Insecurity: Not on file  Transportation Needs: Not on file  Physical Activity: Not on file  Stress: Not on file  Social Connections: Not on file  Intimate Partner Violence: Not on file    Review of Systems: See HPI, otherwise negative ROS  Physical Exam: BP (!) 144/102    Pulse 74    Temp (!) 96.9 F (36.1 C) (Temporal)    Resp 18    Ht '5\' 8"'  (1.727 m)    Wt 77.1 kg    SpO2 100%    BMI 25.85 kg/m  General:   Alert,  pleasant and cooperative in NAD Head:  Normocephalic and atraumatic. Neck:  Supple;  no masses or thyromegaly. Lungs:  Clear throughout to auscultation, normal respiratory effort.    Heart:  +S1, +S2, Regular rate and rhythm, No edema. Abdomen:  Soft, nontender and nondistended. Normal bowel sounds, without guarding, and without rebound.   Neurologic:  Alert and  oriented x4;  grossly normal neurologically.  Impression/Plan: Samuel Riley is here for an colonoscopy to be performed for Screening colonoscopy average risk   Risks, benefits, limitations, and alternatives regarding  colonoscopy have been reviewed with the patient.  Questions have been answered.  All parties agreeable.   Jonathon Bellows, MD  03/17/2021, 11:20 AM

## 2021-03-20 ENCOUNTER — Encounter: Payer: Self-pay | Admitting: Gastroenterology

## 2021-03-20 LAB — SURGICAL PATHOLOGY

## 2021-03-21 ENCOUNTER — Encounter: Payer: Self-pay | Admitting: Gastroenterology

## 2021-04-27 ENCOUNTER — Ambulatory Visit: Payer: PRIVATE HEALTH INSURANCE | Admitting: Internal Medicine

## 2021-04-27 ENCOUNTER — Encounter: Payer: Self-pay | Admitting: Emergency Medicine

## 2021-04-27 ENCOUNTER — Other Ambulatory Visit: Payer: Self-pay

## 2021-04-27 ENCOUNTER — Emergency Department
Admission: EM | Admit: 2021-04-27 | Discharge: 2021-04-27 | Disposition: A | Discharge status: Home / Self Care | Payer: PRIVATE HEALTH INSURANCE | Attending: Emergency Medicine | Admitting: Emergency Medicine

## 2021-04-27 DIAGNOSIS — F1010 Alcohol abuse, uncomplicated: Secondary | ICD-10-CM | POA: Diagnosis present

## 2021-04-27 NOTE — ED Notes (Addendum)
Patient dressed out into hospital provided clothing, his belongings placed in labeled belongings bag. ? ?Belongings Include: ?[employer]ee ?Veleta Min[employer] Short ?Hey Dudes ?Boxers ? ?Per patient request, belongings sent home with brother. ?

## 2021-04-27 NOTE — ED Notes (Signed)
Pt given hospital phone to call ride as EDP Stafford verbal he is going to d/c pt with resources soon.  ?

## 2021-04-27 NOTE — Progress Notes (Signed)
Called to get the appt started with pt. Pt's girlfriend answered the phone. When I asked to speak with pt she stated that she had just sent Dr. Derrel Nip a message to let her know that the pt is unresponsive in bed because he has been drinking alcohol. She stated that he is depressed and has been in bed for four days know. I spoke verbally with Dr. Derrel Nip and she advised that she call 911. Girlfriend gave a verbal understanding.  ?

## 2021-04-27 NOTE — ED Notes (Signed)
Delay explained to pt. Pt cooperative but anxious to leave.  ?

## 2021-04-27 NOTE — ED Notes (Signed)
Pt went to lobby to check on his ride (his brother) without paperwork. Sent NT with paperwork to try and find pt so he can have his resources; however, pt was already far out in ER parking lot meeting his ride. Pt did not sign for d/c.  ?

## 2021-04-27 NOTE — ED Triage Notes (Signed)
Pt in via POV w/ brother, states, "I'm crazy, I have been drinking a lot lately."  Per brother, patient has barely got out of bed in 3 days; reports this is third episode of binge drinking in last 30 days.  Does report SI, also states, "I'm not leaving my brother."  Per brother, "He has not harmed himself because he didn't want to leave me in a mess."  Patient concurs. ? ?Ambulatory to triage, NAD noted at this time.   ?

## 2021-04-27 NOTE — Progress Notes (Deleted)
Called patient to pre register this morning but he was in school at time. Spoke to his Mom she gave me his number to reach out, but I didn't get an answer. ?

## 2021-04-27 NOTE — ED Provider Notes (Signed)
? ?[facility] ?Provider Note ? ? ? Event Date/Time  ? First MD Initiated Contact with Patient 04/27/21 2042   ?  (approximate) ? ? ?History  ? ?"I am here because my brother and my sister-in-law want me to get checked out" ? ?HPI ? ?Samuel Riley is a 51 y.o. male with a past history of alcohol abuse who comes to the ED due to alcohol abuse.  He reports drinking on an almost daily basis, not eating regularly.  He states that he feels fine, no SI HI or hallucinations.  Denies any safety concerns at home, no falls.  Denies any pain or vomiting.  No black or bloody stool.  Reports that he is just here because his brother and brother spouse wanted him to get evaluated.  He is interested in outpatient rehab, but does not want to stay in the hospital currently. ?  ? ? ?Physical Exam  ? ?Triage Vital Signs: ?ED Triage Vitals  ?Enc Vitals Group  ?   BP 04/27/21 2022 (!) 155/118  ?   Pulse Rate 04/27/21 2022 97  ?   Resp 04/27/21 2022 17  ?   Temp 04/27/21 2022 99.2 ?F (37.3 ?C)  ?   Temp Source 04/27/21 2022 Oral  ?   SpO2 04/27/21 2022 97 %  ?   Weight 04/27/21 2023 170 lb (77.1 kg)  ?   Height 04/27/21 2023 5\' 8"  (1.727 m)  ?   Head Circumference --   ?   Peak Flow --   ?   Pain Score 04/27/21 2022 0  ?   Pain Loc --   ?   Pain Edu? --   ?   Excl. in [place]? --   ? ? ?Most recent vital signs: ?Vitals:  ? 04/27/21 2022  ?BP: (!) 155/118  ?Pulse: 97  ?Resp: 17  ?Temp: 99.2 ?F (37.3 ?C)  ?SpO2: 97%  ? ? ? ?General: Awake, no distress.  ?CV:  Good peripheral perfusion.  ?Resp:  Normal effort.  ?Abd:  No distention.  ?Other:  Steady gait, clear speech, ambulatory, tolerating oral intake.  Good sense of humor ? ? ?ED Results / Procedures / Treatments  ? ?Labs ?(all labs ordered are listed, but only abnormal results are displayed) ?Labs Reviewed - No data to display ? ? ?EKG ? ? ? ? ?RADIOLOGY ? ? ? ? ?PROCEDURES: ? ?Critical Care performed: No ? ?Procedures ? ? ?MEDICATIONS ORDERED IN ED: ?Medications -  No data to display ? ? ?IMPRESSION / MDM / ASSESSMENT AND PLAN / ED COURSE  ?I reviewed the triage vital signs and the nursing notes. ?             ?               ? ?Patient presents for evaluation of alcohol abuse.  Vitals are unremarkable, exam is unremarkable.  No evidence of intoxication or withdrawal.  Not an imminent danger to himself or anyone else, not engaging in excessively risky activity.  He wishes to be discharged, and he has medical decision-making capacity, and he is medically stable for discharge.  Will provide follow-up resources for substance abuse treatment. ? ? ? ? ?  ? ? ?FINAL CLINICAL IMPRESSION(S) / ED DIAGNOSES  ? ?Final diagnoses:  ?Alcohol abuse  ? ? ? ?Rx / DC Orders  ? ?ED Discharge Orders   ? ? None  ? ?  ? ? ? ?Note:  This document was prepared  using Systems analyst and may include unintentional dictation errors. ?  ?Carrie Mew, MD ?04/27/21 2138 ? ?

## 2021-04-27 NOTE — ED Notes (Signed)
Pt ambulatory to room; calm and cooperative; pt denies SI and HI currently; states was SI earlier today but without a specific plan; pt reports trying to detox; reports has been struggling with alcohol for last few months; pt given warm blankets and an extra pillow as requested; lights dimmed as requested. Pt's resp reg/unlabored and skin dry. ?

## 2021-04-29 NOTE — Assessment & Plan Note (Signed)
He did not keep his appointment to discuss getting help for his alcohol abuse and depression because he was drunk.  ?

## 2021-05-05 ENCOUNTER — Other Ambulatory Visit: Payer: Self-pay

## 2021-05-05 ENCOUNTER — Ambulatory Visit (INDEPENDENT_AMBULATORY_CARE_PROVIDER_SITE_OTHER): Payer: PRIVATE HEALTH INSURANCE | Admitting: Internal Medicine

## 2021-05-05 ENCOUNTER — Encounter: Payer: Self-pay | Admitting: Internal Medicine

## 2021-05-05 VITALS — BP 156/94 | HR 86 | Temp 98.6°F | Ht 68.0 in | Wt 178.6 lb

## 2021-05-05 DIAGNOSIS — R5383 Other fatigue: Secondary | ICD-10-CM

## 2021-05-05 DIAGNOSIS — F901 Attention-deficit hyperactivity disorder, predominantly hyperactive type: Secondary | ICD-10-CM | POA: Diagnosis not present

## 2021-05-05 DIAGNOSIS — I10 Essential (primary) hypertension: Secondary | ICD-10-CM | POA: Diagnosis not present

## 2021-05-05 DIAGNOSIS — F1010 Alcohol abuse, uncomplicated: Secondary | ICD-10-CM

## 2021-05-05 DIAGNOSIS — D72821 Monocytosis (symptomatic): Secondary | ICD-10-CM

## 2021-05-05 MED ORDER — CLONIDINE HCL 0.1 MG PO TABS: 0.1000 mg | ORAL_TABLET | Freq: Every evening | ORAL | 2 refills | Status: DC | PRN

## 2021-05-05 MED ORDER — AMLODIPINE BESYLATE 10 MG PO TABS: 10.0000 mg | ORAL_TABLET | Freq: Every day | ORAL | 1 refills | Status: DC

## 2021-05-05 NOTE — Assessment & Plan Note (Signed)
Advised to suspend Straterra for now given his uncontrolled hypertension.  ?

## 2021-05-05 NOTE — Assessment & Plan Note (Signed)
Readings have been elevated for the past week on 5 mg amlodipine.  Increase dose to 10 mg daily.  Clonidine 0.1 mg qhs prn  ?

## 2021-05-05 NOTE — Assessment & Plan Note (Addendum)
He has been binging every 6 weeks on average for the last several months .  He  completed detoxificationwith a 2 day in patient stay at a facility in  [place], returning home on Sunday.  Son Kathlyn [place] staying with him.    Going to AA daily .  LFTS are normal ? ?Lab Results  ?Component Value Date  ? ALT 37 05/05/2021  ? AST 23 05/05/2021  ? ALKPHOS 82 12/28/2020  ? BILITOT 0.4 05/05/2021  ? ? ?

## 2021-05-05 NOTE — Progress Notes (Signed)
? ?Subjective:  ?Patient ID: Samuel Riley, male    DOB: [birth year]  Age: 51 y.o. MRN: [id number] ? ?CC: The primary encounter diagnosis was Essential hypertension. Diagnoses of Other fatigue, Alcohol abuse, Hyperactivity disorder, predominantly hyperactive-impulsive type, Primary hypertension, and Monocytosis were also pertinent to this visit. ? ? ?This visit occurred during the SARS-CoV-2 public health emergency.  Safety protocols were in place, including screening questions prior to the visit, additional usage of staff PPE, and extensive cleaning of exam room while observing appropriate contact time as indicated for disinfecting solutions.   ? ?HPI ?Samuel Riley presents for elevated blood pressure ?Chief Complaint  ?Patient presents with  ? Follow-up  ?  Follow up to discuss elevated blood pressure  ? ? ?Htn: most recent readings at home have ranged from  156 to 153 systolic on 5 mg amlodipine. ? ?AA:  s/p 2 days of inpatient rehab in [place].  Had withdrawal symptoms on the way .  now going to AA daily and maintaining abstinence. Documenting blood alcohol three times daily using a breathalyzer.   ? ?Outpatient Medications Prior to Visit  ?Medication Sig Dispense Refill  ? ALPRAZolam (XANAX XR) 0.5 MG 24 hr tablet Take 1 tablet (0.5 mg total) by mouth daily as needed for anxiety. 30 tablet 2  ? atomoxetine (STRATTERA) 10 MG capsule Take 1 capsule (10 mg total) by mouth daily. 90 capsule 1  ? Calcipotriene-Betameth Diprop (ENSTILAR) 0.005-0.064 % FOAM Apply 1 application topically daily. 60 g 2  ? celecoxib (CELEBREX) 200 MG capsule TAKE 1 CAPSULE (200 MG TOTAL) BY MOUTH DAILY AFTER BREAKFAST. 30 capsule 3  ? finasteride (PROPECIA) 1 MG tablet Take 1 mg by mouth daily.    ? omeprazole (PRILOSEC) 20 MG capsule Take 20 mg by mouth daily.    ? Sodium Sulfate-Mag Sulfate-KCl (SUTAB) [phone] MG TABS Take 1 kit by mouth as directed. 24 tablet 0  ? amLODipine (NORVASC) 2.5 MG tablet Take 1 tablet (2.5 mg  total) by mouth daily. 90 tablet 1  ? finasteride (PROSCAR) 5 MG tablet Take by mouth daily.   6  ? ?Facility-Administered Medications Prior to Visit  ?Medication Dose Route Frequency Provider Last Rate Last Admin  ? lidocaine (XYLOCAINE) 2 % (with pres) injection 15 mg  0.75 mL Other Once Samuel Mc, MD      ? ? ?Review of Systems; ? ?Patient denies headache, fevers, malaise, unintentional weight loss, skin rash, eye pain, sinus congestion and sinus pain, sore throat, dysphagia,  hemoptysis , cough, dyspnea, wheezing, chest pain, palpitations, orthopnea, edema, abdominal pain, nausea, melena, diarrhea, constipation, flank pain, dysuria, hematuria, urinary  Frequency, nocturia, numbness, tingling, seizures,  Focal weakness, Loss of consciousness,  Tremor, insomnia, depression, anxiety, and suicidal ideation.   ? ? ? ?Objective:  ?BP (!) 156/94 (BP Location: Left Arm, Patient Position: Sitting, Cuff Size: Normal)   Pulse 86   Temp 98.6 ?F (37 ?C) (Oral)   Ht $R'5\' 8"'lV$  (1.727 m)   Wt 178 lb 9.6 oz (81 kg)   SpO2 98%   BMI 27.16 kg/m?  ? ?BP Readings from Last 3 Encounters:  ?05/05/21 (!) 156/94  ?04/27/21 (!) 155/118  ?03/17/21 (!) 136/107  ? ? ?Wt Readings from Last 3 Encounters:  ?05/05/21 178 lb 9.6 oz (81 kg)  ?04/27/21 170 lb (77.1 kg)  ?03/17/21 170 lb (77.1 kg)  ? ? ?General appearance: alert, cooperative and appears stated age ?Ears: normal TM's and external ear canals both ears ?Throat: lips, mucosa, and  tongue normal; teeth and gums normal ?Neck: no adenopathy, no carotid bruit, supple, symmetrical, trachea midline and thyroid not enlarged, symmetric, no tenderness/mass/nodules ?Back: symmetric, no curvature. ROM normal. No CVA tenderness. ?Lungs: clear to auscultation bilaterally ?Heart: regular rate and rhythm, S1, S2 normal, no murmur, click, rub or gallop ?Abdomen: soft, non-tender; bowel sounds normal; no masses,  no organomegaly ?Pulses: 2+ and symmetric ?Skin: Skin color, texture, turgor  normal. No rashes or lesions ?Lymph nodes: Cervical, supraclavicular, and axillary nodes normal. ? ?Lab Results  ?Component Value Date  ? HGBA1C 5.2 06/10/2017  ? HGBA1C 5.5 05/21/2016  ? ? ?Lab Results  ?Component Value Date  ? CREATININE 0.90 05/05/2021  ? CREATININE 0.91 12/28/2020  ? CREATININE 0.90 10/03/2018  ? ? ?Lab Results  ?Component Value Date  ? WBC 11.8 (H) 05/05/2021  ? HGB 15.8 05/05/2021  ? HCT 44.1 05/05/2021  ? PLT 430 (H) 05/05/2021  ? GLUCOSE 93 05/05/2021  ? CHOL 176 12/28/2020  ? TRIG 497.0 (H) 12/28/2020  ? HDL 33.40 (L) 12/28/2020  ? LDLDIRECT 82.0 12/28/2020  ? LDLCALC 148 (H) 05/18/2016  ? ALT 37 05/05/2021  ? AST 23 05/05/2021  ? NA 139 05/05/2021  ? K 3.9 05/05/2021  ? CL 103 05/05/2021  ? CREATININE 0.90 05/05/2021  ? BUN 12 05/05/2021  ? CO2 29 05/05/2021  ? TSH 0.95 05/05/2021  ? HGBA1C 5.2 06/10/2017  ? ? ?No results found. ? ?Assessment & Plan:  ? ?Problem List Items Addressed This Visit   ? ? Monocytosis  ?  Likely the cause of the mild leukocytosis noted since 2017.  He has a history of remote splenectomy.   Sight Monocytosis was noted on flow cytometry in 2019 .  rrecommending referral back to Samuel Riley to rule out CML ?  ?  ? Relevant Orders  ? CBC with Differential/Platelet  ? Hypertension  ?  Readings have been elevated for the past week on 5 mg amlodipine.  Increase dose to 10 mg daily.  Clonidine 0.1 mg qhs prn  ?  ?  ? Relevant Medications  ? amLODipine (NORVASC) 10 MG tablet  ? cloNIDine (CATAPRES) 0.1 MG tablet  ? Hyperactivity disorder, predominantly hyperactive-impulsive type  ?  Advised to suspend Straterra for now given his uncontrolled hypertension.  ?  ?  ? Alcohol abuse  ?  He has been binging every 6 weeks on average for the last several months .  He  completed detoxificationwith a 2 day in patient stay at a facility in  [place], returning home on Sunday.  Son Samuel Riley staying with him.    Going to AA daily .  LFTS are normal ? ?Lab Results  ?Component Value Date  ?  ALT 37 05/05/2021  ? AST 23 05/05/2021  ? ALKPHOS 82 12/28/2020  ? BILITOT 0.4 05/05/2021  ? ? ?  ?  ? ?Other Visit Diagnoses   ? ? Essential hypertension    -  Primary  ? Relevant Medications  ? amLODipine (NORVASC) 10 MG tablet  ? cloNIDine (CATAPRES) 0.1 MG tablet  ? Other Relevant Orders  ? Comprehensive metabolic panel (Completed)  ? Other fatigue      ? Relevant Orders  ? CBC with Differential/Platelet (Completed)  ? TSH (Completed)  ? ?  ? ? ?I spent 30 minutes dedicated to the care of this patient on the date of this encounter to include pre-visit review of patient's medical history,  most recent imaging studies, Face-to-face time with the patient ,  and post visit ordering of testing and therapeutics.   ? ?Follow-up: No follow-ups on file. ? ? ?Samuel Mc, MD ?

## 2021-05-06 LAB — COMPREHENSIVE METABOLIC PANEL
AG Ratio: 1.7 (calc) (ref 1.0–2.5)
ALT: 37 U/L (ref 9–46)
AST: 23 U/L (ref 10–35)
Albumin: 4.1 g/dL (ref 3.6–5.1)
Alkaline phosphatase (APISO): 57 U/L (ref 35–144)
BUN: 12 mg/dL (ref 7–25)
CO2: 29 mmol/L (ref 20–32)
Calcium: 9.1 mg/dL (ref 8.6–10.3)
Chloride: 103 mmol/L (ref 98–110)
Creat: 0.9 mg/dL (ref 0.70–1.30)
Globulin: 2.4 g/dL (calc) (ref 1.9–3.7)
Glucose, Bld: 93 mg/dL (ref 65–99)
Potassium: 3.9 mmol/L (ref 3.5–5.3)
Sodium: 139 mmol/L (ref 135–146)
Total Bilirubin: 0.4 mg/dL (ref 0.2–1.2)
Total Protein: 6.5 g/dL (ref 6.1–8.1)

## 2021-05-06 LAB — CBC WITH DIFFERENTIAL/PLATELET
Absolute Monocytes: 1369 cells/uL — ABNORMAL HIGH (ref 200–950)
Basophils Absolute: 130 cells/uL (ref 0–200)
Basophils Relative: 1.1 %
Eosinophils Absolute: 307 cells/uL (ref 15–500)
Eosinophils Relative: 2.6 %
HCT: 44.1 % (ref 38.5–50.0)
Hemoglobin: 15.8 g/dL (ref 13.2–17.1)
Lymphs Abs: 3080 cells/uL (ref 850–3900)
MCH: 36.2 pg — ABNORMAL HIGH (ref 27.0–33.0)
MCHC: 35.8 g/dL (ref 32.0–36.0)
MCV: 100.9 fL — ABNORMAL HIGH (ref 80.0–100.0)
MPV: 10.4 fL (ref 7.5–12.5)
Monocytes Relative: 11.6 %
Neutro Abs: 6915 cells/uL (ref 1500–7800)
Neutrophils Relative %: 58.6 %
Platelets: 430 10*3/uL — ABNORMAL HIGH (ref 140–400)
RBC: 4.37 10*6/uL (ref 4.20–5.80)
RDW: 12.4 % (ref 11.0–15.0)
Total Lymphocyte: 26.1 %
WBC: 11.8 10*3/uL — ABNORMAL HIGH (ref 3.8–10.8)

## 2021-05-06 LAB — TSH: TSH: 0.95 mIU/L (ref 0.40–4.50)

## 2021-05-06 NOTE — Assessment & Plan Note (Addendum)
Likely the cause of the mild leukocytosis noted since 2017.  He has a history of remote splenectomy.   Sight Monocytosis was noted on flow cytometry in 2019 .  rrecommending referral back to Dr Talbert Cage to rule out CML ?

## 2021-05-09 ENCOUNTER — Other Ambulatory Visit: Payer: Self-pay | Admitting: Internal Medicine

## 2021-05-09 DIAGNOSIS — D72821 Monocytosis (symptomatic): Secondary | ICD-10-CM

## 2021-05-09 NOTE — Assessment & Plan Note (Signed)
Mild,  Present for the last 2 years with no appreciable change;  He is s/p remote splenectomy  And has had prior workup for erythrocytosis. .  Will  Recommend returning  to hematology for further evaluation. Patient has been advised and agrees to referral   ? ?

## 2021-05-16 ENCOUNTER — Encounter: Payer: Self-pay | Admitting: *Deleted

## 2021-05-24 ENCOUNTER — Inpatient Hospital Stay: Payer: PRIVATE HEALTH INSURANCE | Attending: Oncology | Admitting: Oncology

## 2021-05-24 ENCOUNTER — Other Ambulatory Visit: Payer: Self-pay

## 2021-05-24 ENCOUNTER — Inpatient Hospital Stay: Payer: PRIVATE HEALTH INSURANCE

## 2021-05-24 ENCOUNTER — Encounter: Payer: Self-pay | Admitting: Oncology

## 2021-05-24 VITALS — BP 141/91 | HR 63 | Temp 97.0°F | Resp 18 | Wt 173.3 lb

## 2021-05-24 DIAGNOSIS — F17210 Nicotine dependence, cigarettes, uncomplicated: Secondary | ICD-10-CM | POA: Diagnosis not present

## 2021-05-24 DIAGNOSIS — Z720 Tobacco use: Secondary | ICD-10-CM

## 2021-05-24 DIAGNOSIS — D72821 Monocytosis (symptomatic): Secondary | ICD-10-CM | POA: Diagnosis present

## 2021-05-24 DIAGNOSIS — Z79899 Other long term (current) drug therapy: Secondary | ICD-10-CM | POA: Insufficient documentation

## 2021-05-24 DIAGNOSIS — Z9081 Acquired absence of spleen: Secondary | ICD-10-CM | POA: Insufficient documentation

## 2021-05-24 DIAGNOSIS — D75839 Thrombocytosis, unspecified: Secondary | ICD-10-CM

## 2021-05-24 DIAGNOSIS — Z85828 Personal history of other malignant neoplasm of skin: Secondary | ICD-10-CM | POA: Insufficient documentation
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
## 2021-05-24 NOTE — Progress Notes (Signed)
Pt here to establish care.

## 2021-05-31 ENCOUNTER — Other Ambulatory Visit: Payer: Self-pay | Admitting: Internal Medicine

## 2021-06-23 ENCOUNTER — Other Ambulatory Visit: Payer: Self-pay | Admitting: Internal Medicine

## 2021-06-30 ENCOUNTER — Other Ambulatory Visit: Payer: Self-pay | Admitting: Internal Medicine

## 2021-08-02 ENCOUNTER — Other Ambulatory Visit: Payer: Self-pay | Admitting: Internal Medicine

## 2021-08-08 ENCOUNTER — Encounter: Payer: Self-pay | Admitting: Internal Medicine

## 2021-08-08 ENCOUNTER — Ambulatory Visit (INDEPENDENT_AMBULATORY_CARE_PROVIDER_SITE_OTHER): Payer: PRIVATE HEALTH INSURANCE | Admitting: Internal Medicine

## 2021-08-08 DIAGNOSIS — F1010 Alcohol abuse, uncomplicated: Secondary | ICD-10-CM | POA: Diagnosis not present

## 2021-08-08 DIAGNOSIS — F320 Major depressive disorder, single episode, mild: Secondary | ICD-10-CM | POA: Diagnosis not present

## 2021-08-08 DIAGNOSIS — F901 Attention-deficit hyperactivity disorder, predominantly hyperactive type: Secondary | ICD-10-CM

## 2021-08-08 DIAGNOSIS — F329 Major depressive disorder, single episode, unspecified: Secondary | ICD-10-CM | POA: Insufficient documentation

## 2021-08-08 MED ORDER — SERTRALINE HCL 50 MG PO TABS: 50.0000 mg | ORAL_TABLET | Freq: Every day | ORAL | 3 refills | Status: DC

## 2021-08-08 MED ORDER — DISULFIRAM 250 MG PO TABS: 250.0000 mg | ORAL_TABLET | Freq: Every day | ORAL | 5 refills | Status: DC

## 2021-08-08 MED ORDER — ATOMOXETINE HCL 10 MG PO CAPS: ORAL_CAPSULE | ORAL | 0 refills | Status: DC

## 2021-08-08 NOTE — Assessment & Plan Note (Signed)
Starting sertraline at 25 mg daily x 4 days,, then 50 mg daily

## 2021-08-08 NOTE — Assessment & Plan Note (Signed)
Starting Antabuse 250 mg daily.  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

## 2021-08-08 NOTE — Progress Notes (Signed)
Subjective:  Patient ID: Samuel Riley, male    DOB: [birth year]  Age: 51 y.o. MRN: [id number]  CC: Diagnoses of Hyperactivity disorder, predominantly hyperactive-impulsive type, Alcohol abuse, and Current mild episode of major depressive disorder without prior episode [facility]) were pertinent to this visit.   HPI Samuel Riley presents for    management of alcohol abuse complicated by depression Chief Complaint  Patient presents with   Follow-up   Samuel Riley is a 51 yr old male with a remote history of cocaine abuse who recently finished an 30 day inpatient rehab stay in [place] for alcohol abuse.  He returned to [facility]  Thursday,   and states that he had 4 drinks on the plane and went directly to the liquor store.  He drank heavily all weekend and sustained an abrasion to his left forehead when he lost his balance while sitting on the concrete steps outside his home.  His last drink was Monday morning ., 24 hours ago   He is accompanied by his girlfriend of 2 years.  Samuel Riley.  They have been estranged for 4 months due to his alcohol abuse .   He   Samuel Riley states that he has finally reached the conclusion that he cannot drink  alcohol  at all.  He is requesting deterrant medication and medication for depression.  He has  a Retail banker in [employer] . Named  Samuel Riley   [phone]  talking once a week.   He has contacted the local AA group and plans to go several times per week  His dose of Straterra was increased from 10 mg daily to 18 mg daily during his rehab stay.  He states that the medication is wearing off by 2 PM and requests an afternoon dose.   Outpatient Medications Prior to Visit  Medication Sig Dispense Refill   ALPRAZolam (XANAX XR) 0.5 MG 24 hr tablet Take 1 tablet (0.5 mg total) by mouth daily as needed for anxiety. 30 tablet 2   amLODipine (NORVASC) 10 MG tablet Take 1 tablet (10 mg total) by mouth daily. 90 tablet 1   Calcipotriene-Betameth Diprop (ENSTILAR) 0.005-0.064 % FOAM Apply 1  application topically daily. 60 g 2   celecoxib (CELEBREX) 200 MG capsule TAKE 1 CAPSULE (200 MG TOTAL) BY MOUTH DAILY AFTER BREAKFAST. 30 capsule 3   cloNIDine (CATAPRES) 0.1 MG tablet TAKE 1 TABLET (0.1 MG TOTAL) BY MOUTH AT BEDTIME AS NEEDED. 30 tablet 2   finasteride (PROPECIA) 1 MG tablet Take 1 mg by mouth daily.     omeprazole (PRILOSEC) 20 MG capsule Take 20 mg by mouth daily.     RA MELATONIN 10 MG TABS Take 1 tablet by mouth at bedtime.     Sodium Sulfate-Mag Sulfate-KCl (SUTAB) [phone] MG TABS Take 1 kit by mouth as directed. 24 tablet 0   atomoxetine (STRATTERA) 18 MG capsule Take 18 mg by mouth daily.     atomoxetine (STRATTERA) 10 MG capsule TAKE 1 CAPSULE BY MOUTH EVERY DAY (Patient not taking: Reported on 08/08/2021) 90 capsule 1   Facility-Administered Medications Prior to Visit  Medication Dose Route Frequency Provider Last Rate Last Admin   lidocaine (XYLOCAINE) 2 % (with pres) injection 15 mg  0.75 mL Other Once Crecencio Mc, MD        Review of Systems;  Patient denies headache, fevers, malaise, unintentional weight loss, skin rash, eye pain, sinus congestion and sinus pain, sore throat, dysphagia,  hemoptysis , cough, dyspnea, wheezing, chest pain, palpitations, orthopnea,  edema, abdominal pain, nausea, melena, diarrhea, constipation, flank pain, dysuria, hematuria, urinary  Frequency, nocturia, numbness, tingling, seizures,  Focal weakness, Loss of consciousness,  Tremor, insomnia, depression, anxiety, and suicidal ideation.      Objective:  BP (!) 156/90 (BP Location: Left Arm, Patient Position: Sitting, Cuff Size: Normal)   Pulse 87   Temp 97.9 F (36.6 C) (Oral)   Ht _0  (1.727 m)   Wt 171 lb 3.2 oz (77.7 kg)   SpO2 97%   BMI 26.03 kg/m   BP Readings from Last 3 Encounters:  08/08/21 (!) 156/90  05/24/21 (!) 141/91  05/05/21 (!) 156/94    Wt Readings from Last 3 Encounters:  08/08/21 171 lb 3.2 oz (77.7 kg)  05/24/21 173 lb 4.8 oz (78.6 kg)   05/05/21 178 lb 9.6 oz (81 kg)    General appearance: alert, cooperative and appears stated age Ears: normal TM's and external ear canals both ears Throat: lips, mucosa, and tongue normal; teeth and gums normal Neck: no adenopathy, no carotid bruit, supple, symmetrical, trachea midline and thyroid not enlarged, symmetric, no tenderness/mass/nodules Back: symmetric, no curvature. ROM normal. No CVA tenderness. Lungs: clear to auscultation bilaterally Heart: regular rate and rhythm, S1, S2 normal, no murmur, click, rub or gallop Abdomen: soft, non-tender; bowel sounds normal; no masses,  no organomegaly Pulses: 2+ and symmetric Skin: Skin color, texture, turgor normal. No rashes or lesions Lymph nodes: Cervical, supraclavicular, and axillary nodes normal.  Lab Results  Component Value Date   HGBA1C 5.2 06/10/2017   HGBA1C 5.5 05/21/2016    Lab Results  Component Value Date   CREATININE 0.90 05/05/2021   CREATININE 0.91 12/28/2020   CREATININE 0.90 10/03/2018    Lab Results  Component Value Date   WBC 11.8 (H) 05/05/2021   HGB 15.8 05/05/2021   HCT 44.1 05/05/2021   PLT 430 (H) 05/05/2021   GLUCOSE 93 05/05/2021   CHOL 176 12/28/2020   TRIG 497.0 (H) 12/28/2020   HDL 33.40 (L) 12/28/2020   LDLDIRECT 82.0 12/28/2020   LDLCALC 148 (H) 05/18/2016   ALT 37 05/05/2021   AST 23 05/05/2021   NA 139 05/05/2021   K 3.9 05/05/2021   CL 103 05/05/2021   CREATININE 0.90 05/05/2021   BUN 12 05/05/2021   CO2 29 05/05/2021   TSH 0.95 05/05/2021   HGBA1C 5.2 06/10/2017    No results found.  Assessment & Plan:   Problem List Items Addressed This Visit     Major depressive disorder with single episode    Starting sertraline at 25 mg daily x 4 days,, then 50 mg daily       Relevant Medications   sertraline (ZOLOFT) 50 MG tablet   Hyperactivity disorder, predominantly hyperactive-impulsive type    Changing straterra dose to 20 mg in am,  10 mg in pm       Alcohol abuse     Starting Antabuse 250 mg daily.  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 Marland Kitchen  His recent rehab stay ,  his current level of commitment to sobriety and abstinence  and post visit ordering of testing and therapeutics.  Follow-up: No follow-ups on file.   Crecencio Mc, MD

## 2021-08-08 NOTE — Assessment & Plan Note (Signed)
Changing straterra dose to 20 mg in am,  10 mg in pm

## 2021-08-10 ENCOUNTER — Ambulatory Visit (INDEPENDENT_AMBULATORY_CARE_PROVIDER_SITE_OTHER): Payer: PRIVATE HEALTH INSURANCE | Admitting: Dermatology

## 2021-08-10 ENCOUNTER — Encounter: Payer: Self-pay | Admitting: Dermatology

## 2021-08-10 DIAGNOSIS — L820 Inflamed seborrheic keratosis: Secondary | ICD-10-CM

## 2021-08-10 DIAGNOSIS — W19XXXA Unspecified fall, initial encounter: Secondary | ICD-10-CM

## 2021-08-10 DIAGNOSIS — T148XXA Other injury of unspecified body region, initial encounter: Secondary | ICD-10-CM

## 2021-08-10 DIAGNOSIS — D2371 Other benign neoplasm of skin of right lower limb, including hip: Secondary | ICD-10-CM

## 2021-08-10 DIAGNOSIS — Z86018 Personal history of other benign neoplasm: Secondary | ICD-10-CM

## 2021-08-10 DIAGNOSIS — L578 Other skin changes due to chronic exposure to nonionizing radiation: Secondary | ICD-10-CM

## 2021-08-10 DIAGNOSIS — D229 Melanocytic nevi, unspecified: Secondary | ICD-10-CM

## 2021-08-10 DIAGNOSIS — Z85828 Personal history of other malignant neoplasm of skin: Secondary | ICD-10-CM

## 2021-08-10 DIAGNOSIS — Z1283 Encounter for screening for malignant neoplasm of skin: Secondary | ICD-10-CM | POA: Diagnosis not present

## 2021-08-10 DIAGNOSIS — L814 Other melanin hyperpigmentation: Secondary | ICD-10-CM

## 2021-08-10 DIAGNOSIS — S0081XA Abrasion of other part of head, initial encounter: Secondary | ICD-10-CM

## 2021-08-10 DIAGNOSIS — D1800 Hemangioma unspecified site: Secondary | ICD-10-CM

## 2021-08-10 DIAGNOSIS — L821 Other seborrheic keratosis: Secondary | ICD-10-CM

## 2021-08-10 NOTE — Progress Notes (Unsigned)
Follow-Up Visit   Subjective  Samuel Riley is a 51 y.o. male who presents for the following: Annual Exam (Skin cancer screening. Full body. Hx of multiple BCC's and dysplastic nevi. Lesion on crown of scalp of concern, will not heal). The patient presents for Total-Body Skin Exam (TBSE) for skin cancer screening and mole check.  The patient has spots, moles and lesions to be evaluated, some may be new or changing and the patient has concerns that these could be cancer.  The following portions of the chart were reviewed this encounter and updated as appropriate:  Tobacco  Allergies  Meds  Problems  Med Hx  Surg Hx  Fam Hx     Review of Systems: No other skin or systemic complaints except as noted in HPI or Assessment and Plan.  Objective  Well appearing patient in no apparent distress; mood and affect are within normal limits.  A full examination was performed including scalp, head, eyes, ears, nose, lips, neck, chest, axillae, abdomen, back, buttocks, bilateral upper extremities, bilateral lower extremities, hands, feet, fingers, toes, fingernails, and toenails. All findings within normal limits unless otherwise noted below.  Scalp x3, right dorsum wrist x1 (4) Erythematous keratotic or waxy stuck-on papule or plaque.  Left Forehead Erythematous patch with scabbing   Assessment & Plan   History of Basal Cell Carcinoma of the Skin - No evidence of recurrence today - Recommend regular full body skin exams - Recommend daily broad spectrum sunscreen SPF 30+ to sun-exposed areas, reapply every 2 hours as needed.  - Call if any new or changing lesions are noted between office visits   History of Dysplastic Nevi - No evidence of recurrence today - Recommend regular full body skin exams - Recommend daily broad spectrum sunscreen SPF 30+ to sun-exposed areas, reapply every 2 hours as needed.  - Call if any new or changing lesions are noted between office visits    Lentigines - Scattered tan macules - Due to sun exposure - Benign-appearing, observe - Recommend daily broad spectrum sunscreen SPF 30+ to sun-exposed areas, reapply every 2 hours as needed. - Call for any changes  Seborrheic Keratoses - Stuck-on, waxy, tan-brown papules and/or plaques  - Benign-appearing - Discussed benign etiology and prognosis. - Observe - Call for any changes  Melanocytic Nevi - Tan-brown and/or pink-flesh-colored symmetric macules and papules - Benign appearing on exam today - Observation - Call clinic for new or changing moles - Recommend daily use of broad spectrum spf 30+ sunscreen to sun-exposed areas.   Hemangiomas - Red papules - Discussed benign nature - Observe - Call for any changes  Actinic Damage - Chronic condition, secondary to cumulative UV/sun exposure - diffuse scaly erythematous macules with underlying dyspigmentation - Recommend daily broad spectrum sunscreen SPF 30+ to sun-exposed areas, reapply every 2 hours as needed.  - Staying in the shade or wearing long sleeves, sun glasses (UVA+UVB protection) and wide brim hats (4-inch brim around the entire circumference of the hat) are also recommended for sun protection.  - Call for new or changing lesions.  Skin cancer screening performed today.  Dermatofibroma. Right thigh. - Firm pink/brown papulenodule with dimple sign - Benign appearing - Call for any changes  Inflamed seborrheic keratosis (4) Scalp x3, right dorsum wrist x1  Symptomatic, irritating, patient would like treated.  Destruction of lesion - Scalp x3, right dorsum wrist x1 Complexity: simple   Destruction method: cryotherapy   Informed consent: discussed and consent obtained   Timeout:  patient name, date of birth, surgical site, and procedure verified Lesion destroyed using liquid nitrogen: Yes   Region frozen until ice ball extended beyond lesion: Yes   Outcome: patient tolerated procedure well with no  complications   Post-procedure details: wound care instructions given   Additional details:  Prior to procedure, discussed risks of blister formation, small wound, skin dyspigmentation, or rare scar following cryotherapy. Recommend Vaseline ointment to treated areas while healing.  Abrasion Left Forehead Secondary to fall Discussed prescribing Mupirocin ointment, patient defers. Will continue Neosporin.   Return in about 6 months (around 02/09/2022) for TBSE, HxBCC's, HxDN.  I, Emelia Salisbury, CMA, am acting as scribe for Sarina Ser, MD. Documentation: I have reviewed the above documentation for accuracy and completeness, and I agree with the above.  Sarina Ser, MD

## 2021-08-10 NOTE — Patient Instructions (Signed)
Cryotherapy Aftercare  Wash gently with soap and water everyday.   Apply Vaseline daily until healed.   Prior to procedure, discussed risks of blister formation, small wound, skin dyspigmentation, or rare scar following cryotherapy. Recommend Vaseline ointment to treated areas while healing.    Melanoma ABCDEs  Melanoma is the most dangerous type of skin cancer, and is the leading cause of death from skin disease.  You are more likely to develop melanoma if you: Have light-colored skin, light-colored eyes, or red or blond hair Spend a lot of time in the sun Tan regularly, either outdoors or in a tanning bed Have had blistering sunburns, especially during childhood Have a close family member who has had a melanoma Have atypical moles or large birthmarks  Early detection of melanoma is key since treatment is typically straightforward and cure rates are extremely high if we catch it early.   The first sign of melanoma is often a change in a mole or a new dark spot.  The ABCDE system is a way of remembering the signs of melanoma.  A for asymmetry:  The two halves do not match. B for border:  The edges of the growth are irregular. C for color:  A mixture of colors are present instead of an even brown color. D for diameter:  Melanomas are usually (but not always) greater than 8m - the size of a pencil eraser. E for evolution:  The spot keeps changing in size, shape, and color.  Please check your skin once per month between visits. You can use a small mirror in front and a large mirror behind you to keep an eye on the back side or your body.   If you see any new or changing lesions before your next follow-up, please call to schedule a visit.  Please continue daily skin protection including broad spectrum sunscreen SPF 30+ to sun-exposed areas, reapplying every 2 hours as needed when you're outdoors.   Staying in the shade or wearing long sleeves, sun glasses (UVA+UVB protection) and wide  brim hats (4-inch brim around the entire circumference of the hat) are also recommended for sun protection.     Due to recent changes in healthcare laws, you may see results of your pathology and/or laboratory studies on MyChart before the doctors have had a chance to review them. We understand that in some cases there may be results that are confusing or concerning to you. Please understand that not all results are received at the same time and often the doctors may need to interpret multiple results in order to provide you with the best plan of care or course of treatment. Therefore, we ask that you please give u[place]2 business days to thoroughly review all your results before contacting the office for clarification. Should we see a critical lab result, you will be contacted sooner.   If You Need Anything After Your Visit  If you have any questions or concerns for your doctor, please call our main line at [phone] press option 4 to reach your doctor's medical assistant. If no one answers, please leave a voicemail as directed and we will return your call as soon as possible. Messages left after 4 pm will be answered the following business day.   You may also send [place] message via [facility] We typically respond to MyChart messages within 1-2 business days.  For prescription refills, please ask your pharmacy to contact our office. Our fax number is [phone]  If you  have an urgent issue when the clinic is closed that cannot wait until the next business day, you can page your doctor at the number below.    Please note that while we do our best to be available for urgent issues outside of office hours, we are not available 24/7.   If you have an urgent issue and are unable to reach [place], you may choose to seek medical care at your doctor's office, retail clinic, urgent care center, or emergency room.  If you have a medical emergency, please immediately call 911 or go to the emergency  department.  Pager Numbers  - Dr. Nehemiah Massed: [phone]  - Dr. Laurence Ferrari: [phone]  - Dr. Nicole Kindred: [phone]  In the event of inclement weather, please call our main line at [phone] for an update on the status of any delays or closures.  Dermatology Medication Tips: Please keep the boxes that topical medications come in in order to help keep track of the instructions about where and how to use these. Pharmacies typically print the medication instructions only on the boxes and not directly on the medication tubes.   If your medication is too expensive, please contact our office at [phone] option 4 or send [place] a message through [place].   We are unable to tell what your co-pay for medications will be in advance as this is different depending on your insurance coverage. However, we may be able to find a substitute medication at lower cost or fill out paperwork to get insurance to cover a needed medication.   If a prior authorization is required to get your medication covered by your insurance company, please allow [place] 1-2 business days to complete this process.  Drug prices often vary depending on where the prescription is filled and some pharmacies may offer cheaper prices.  The website www.goodrx.com contains coupons for medications through different pharmacies. The prices here do not account for what the cost may be with help from insurance (it may be cheaper with your insurance), but the website can give you the price if you did not use any insurance.  - You can print the associated coupon and take it with your prescription to the pharmacy.  - You may also stop by our office during regular business hours and pick up a GoodRx coupon card.  - If you need your prescription sent electronically to a different pharmacy, notify our office through [facility] or by phone at [phone] option 4.     Si Usted Necesita Algo Despus de Su Visita  Tambin puede enviarnos un  mensaje a travs de Pharmacist, community. Por lo general respondemos a los mensajes de MyChart en el transcurso de 1 a 2 das hbiles.  Para renovar recetas, por favor pida a su farmacia que se ponga en contacto con nuestra oficina. Harland Dingwall de fax es [place] [phone].  Si tiene un asunto urgente cuando la clnica est cerrada y que no puede esperar hasta el siguiente da hbil, puede llamar/localizar a su doctor(a) al nmero que aparece a continuacin.   Por favor, tenga en cuenta que aunque hacemos todo lo posible para estar disponibles para asuntos urgentes fuera del horario de [place], no estamos disponibles las 24 horas del da, los 7 das de la [place].   Si tiene un problema urgente y no puede comunicarse con nosotros, puede optar por buscar atencin mdica  en el consultorio de su doctor(a), en una clnica privada, en un centro de atencin urgente o en una sala  de emergencias.  Si tiene Engineering geologist, por favor llame inmediatamente al 911 o vaya a la sala de emergencias.  Nmeros de bper  - Dr. Nehemiah Massed: [phone]  - Dra. Moye: [phone]  - Dra. Nicole Kindred: [phone]  En caso de inclemencias del [place], por favor llame a Johnsie Kindred principal al [phone] para una actualizacin sobre el [place] de cualquier retraso o cierre.  Consejos para la medicacin en dermatologa: Por favor, guarde las cajas en las que vienen los medicamentos de uso tpico para ayudarle a seguir las instrucciones sobre dnde y cmo usarlos. Las farmacias generalmente imprimen las instrucciones del medicamento slo en las cajas y no directamente en los tubos del [place].   Si su medicamento es muy caro, por favor, pngase en contacto con Zigmund Daniel llamando al [phone] y presione la opcin 4 o envenos un mensaje a travs de Pharmacist, community.   No podemos decirle cul ser su copago por los medicamentos por adelantado ya que esto es diferente dependiendo de la cobertura de su seguro. Sin embargo,  es posible que podamos encontrar un medicamento sustituto a Electrical engineer un formulario para que el seguro cubra el medicamento que se considera necesario.   Si se requiere una autorizacin previa para que su compaa de seguros [place] su medicamento, por favor permtanos de 1 a 2 das hbiles para completar este proceso.  Los precios de los medicamentos varan con frecuencia dependiendo del Environmental consultant de dnde se surte la receta y alguna farmacias pueden ofrecer precios ms baratos.  El sitio web www.goodrx.com tiene cupones para medicamentos de Airline pilot. Los precios aqu no tienen en cuenta lo que podra costar con la ayuda del seguro (puede ser ms barato con su seguro), pero el sitio web puede darle el precio si no utiliz Research scientist (physical sciences).  - Puede imprimir el cupn correspondiente y llevarlo con su receta a la farmacia.  - Tambin puede pasar por nuestra oficina durante el horario de atencin regular y Charity fundraiser una tarjeta de cupones de GoodRx.  - Si necesita que su receta se enve electrnicamente a una farmacia diferente, informe a nuestra oficina a travs de MyChart de [place] o por telfono llamando al [phone] y presione la opcin 4.

## 2021-08-15 ENCOUNTER — Encounter: Payer: Self-pay | Admitting: Dermatology

## 2021-08-24 ENCOUNTER — Emergency Department
Admission: EM | Admit: 2021-08-24 | Discharge: 2021-08-25 | Disposition: A | Discharge status: Other Acute Inpatient Hospital | Payer: PRIVATE HEALTH INSURANCE | Attending: Emergency Medicine | Admitting: Emergency Medicine

## 2021-08-24 ENCOUNTER — Emergency Department: Payer: PRIVATE HEALTH INSURANCE

## 2021-08-24 DIAGNOSIS — F1010 Alcohol abuse, uncomplicated: Secondary | ICD-10-CM | POA: Diagnosis not present

## 2021-08-24 DIAGNOSIS — I10 Essential (primary) hypertension: Secondary | ICD-10-CM | POA: Diagnosis not present

## 2021-08-24 DIAGNOSIS — F10920 Alcohol use, unspecified with intoxication, uncomplicated: Secondary | ICD-10-CM

## 2021-08-24 DIAGNOSIS — S0031XA Abrasion of nose, initial encounter: Secondary | ICD-10-CM | POA: Insufficient documentation

## 2021-08-24 DIAGNOSIS — Z87820 Personal history of traumatic brain injury: Secondary | ICD-10-CM | POA: Diagnosis not present

## 2021-08-24 DIAGNOSIS — F17200 Nicotine dependence, unspecified, uncomplicated: Secondary | ICD-10-CM | POA: Insufficient documentation

## 2021-08-24 DIAGNOSIS — F329 Major depressive disorder, single episode, unspecified: Secondary | ICD-10-CM | POA: Diagnosis present

## 2021-08-24 DIAGNOSIS — W19XXXA Unspecified fall, initial encounter: Secondary | ICD-10-CM | POA: Insufficient documentation

## 2021-08-24 DIAGNOSIS — F909 Attention-deficit hyperactivity disorder, unspecified type: Secondary | ICD-10-CM | POA: Insufficient documentation

## 2021-08-24 DIAGNOSIS — Y908 Blood alcohol level of 240 mg/100 ml or more: Secondary | ICD-10-CM | POA: Insufficient documentation

## 2021-08-24 DIAGNOSIS — Z720 Tobacco use: Secondary | ICD-10-CM | POA: Diagnosis present

## 2021-08-24 DIAGNOSIS — S0992XA Unspecified injury of nose, initial encounter: Secondary | ICD-10-CM | POA: Diagnosis present

## 2021-08-24 DIAGNOSIS — Z20822 Contact with and (suspected) exposure to covid-19: Secondary | ICD-10-CM | POA: Diagnosis not present

## 2021-08-24 DIAGNOSIS — M1990 Unspecified osteoarthritis, unspecified site: Secondary | ICD-10-CM | POA: Diagnosis not present

## 2021-08-24 DIAGNOSIS — M179 Osteoarthritis of knee, unspecified: Secondary | ICD-10-CM | POA: Diagnosis present

## 2021-08-24 DIAGNOSIS — R569 Unspecified convulsions: Secondary | ICD-10-CM | POA: Diagnosis not present

## 2021-08-24 DIAGNOSIS — F1020 Alcohol dependence, uncomplicated: Secondary | ICD-10-CM | POA: Diagnosis not present

## 2021-08-24 DIAGNOSIS — F901 Attention-deficit hyperactivity disorder, predominantly hyperactive type: Secondary | ICD-10-CM | POA: Diagnosis present

## 2021-08-24 DIAGNOSIS — Z79899 Other long term (current) drug therapy: Secondary | ICD-10-CM | POA: Insufficient documentation

## 2021-08-24 LAB — CBC WITH DIFFERENTIAL/PLATELET
Abs Immature Granulocytes: 0.21 10*3/uL — ABNORMAL HIGH (ref 0.00–0.07)
Basophils Absolute: 0.1 10*3/uL (ref 0.0–0.1)
Basophils Relative: 1 %
Eosinophils Absolute: 0 10*3/uL (ref 0.0–0.5)
Eosinophils Relative: 0 %
HCT: 50.8 % (ref 39.0–52.0)
Hemoglobin: 18 g/dL — ABNORMAL HIGH (ref 13.0–17.0)
Immature Granulocytes: 1 %
Lymphocytes Relative: 18 %
Lymphs Abs: 2.7 10*3/uL (ref 0.7–4.0)
MCH: 34.5 pg — ABNORMAL HIGH (ref 26.0–34.0)
MCHC: 35.4 g/dL (ref 30.0–36.0)
MCV: 97.3 fL (ref 80.0–100.0)
Monocytes Absolute: 1 10*3/uL (ref 0.1–1.0)
Monocytes Relative: 7 %
Neutro Abs: 11.1 10*3/uL — ABNORMAL HIGH (ref 1.7–7.7)
Neutrophils Relative %: 73 %
Platelets: 541 10*3/uL — ABNORMAL HIGH (ref 150–400)
RBC: 5.22 MIL/uL (ref 4.22–5.81)
RDW: 12.8 % (ref 11.5–15.5)
WBC: 15.2 10*3/uL — ABNORMAL HIGH (ref 4.0–10.5)
nRBC: 0 % (ref 0.0–0.2)

## 2021-08-24 LAB — URINALYSIS, ROUTINE W REFLEX MICROSCOPIC
Bacteria, UA: NONE SEEN
Bilirubin Urine: NEGATIVE
Glucose, UA: NEGATIVE mg/dL
Hgb urine dipstick: NEGATIVE
Ketones, ur: NEGATIVE mg/dL
Leukocytes,Ua: NEGATIVE
Nitrite: NEGATIVE
Protein, ur: 30 mg/dL — AB
Specific Gravity, Urine: 1.014 (ref 1.005–1.030)
pH: 6 (ref 5.0–8.0)

## 2021-08-24 LAB — URINE DRUG SCREEN, QUALITATIVE (ARMC ONLY)
Amphetamines, Ur Screen: NOT DETECTED
Barbiturates, Ur Screen: NOT DETECTED
Benzodiazepine, Ur Scrn: NOT DETECTED
Cannabinoid 50 Ng, Ur ~~LOC~~: NOT DETECTED
Cocaine Metabolite,Ur ~~LOC~~: NOT DETECTED
MDMA (Ecstasy)Ur Screen: NOT DETECTED
Methadone Scn, Ur: NOT DETECTED
Opiate, Ur Screen: NOT DETECTED
Phencyclidine (PCP) Ur S: NOT DETECTED
Tricyclic, Ur Screen: NOT DETECTED

## 2021-08-24 LAB — COMPREHENSIVE METABOLIC PANEL
ALT: 33 U/L (ref 0–44)
AST: 73 U/L — ABNORMAL HIGH (ref 15–41)
Albumin: 4.2 g/dL (ref 3.5–5.0)
Alkaline Phosphatase: 68 U/L (ref 38–126)
Anion gap: 14 (ref 5–15)
BUN: 11 mg/dL (ref 6–20)
CO2: 25 mmol/L (ref 22–32)
Calcium: 8.6 mg/dL — ABNORMAL LOW (ref 8.9–10.3)
Chloride: 99 mmol/L (ref 98–111)
Creatinine, Ser: 0.94 mg/dL (ref 0.61–1.24)
GFR, Estimated: >60 mL/min (ref >60)
Glucose, Bld: 165 mg/dL — ABNORMAL HIGH (ref 70–99)
Potassium: 3.6 mmol/L (ref 3.5–5.1)
Sodium: 138 mmol/L (ref 135–145)
Total Bilirubin: 1 mg/dL (ref 0.3–1.2)
Total Protein: 7.4 g/dL (ref 6.5–8.1)

## 2021-08-24 LAB — RESP PANEL BY RT-PCR (FLU A&B, COVID) ARPGX2
Influenza A by PCR: NEGATIVE
Influenza B by PCR: NEGATIVE
SARS Coronavirus 2 by RT PCR: NEGATIVE

## 2021-08-24 LAB — ETHANOL: Alcohol, Ethyl (B): 302 mg/dL (ref <10)

## 2021-08-24 LAB — ACETAMINOPHEN LEVEL: Acetaminophen (Tylenol), Serum: <10 ug/mL — ABNORMAL LOW (ref 10–30)

## 2021-08-24 LAB — SALICYLATE LEVEL: Salicylate Lvl: <7 mg/dL — ABNORMAL LOW (ref 7.0–30.0)

## 2021-08-24 MED ORDER — LORAZEPAM 2 MG PO TABS
0.0000 mg | ORAL_TABLET | Freq: Four times a day (QID) | ORAL | Status: DC
  Administered 2021-08-24 – 2021-08-25 (×3): 2 mg via ORAL
  Filled 2021-08-24 (×3): qty 1

## 2021-08-24 MED ORDER — THIAMINE HCL 100 MG/ML IJ SOLN
100.0000 mg | Freq: Every day | INTRAMUSCULAR | Status: DC
Start: 2021-08-25 — End: 2021-08-25

## 2021-08-24 MED ORDER — SERTRALINE HCL 50 MG PO TABS
50.0000 mg | ORAL_TABLET | Freq: Every day | ORAL | Status: DC
  Administered 2021-08-25: 50 mg via ORAL
  Filled 2021-08-24: qty 1

## 2021-08-24 MED ORDER — AMLODIPINE BESYLATE 5 MG PO TABS
10.0000 mg | ORAL_TABLET | Freq: Every day | ORAL | Status: DC
  Administered 2021-08-25: 10 mg via ORAL
  Filled 2021-08-24: qty 2

## 2021-08-24 MED ORDER — ONDANSETRON 4 MG PO TBDP
4.0000 mg | ORAL_TABLET | Freq: Three times a day (TID) | ORAL | Status: DC | PRN
  Administered 2021-08-24: 4 mg via ORAL
  Filled 2021-08-24: qty 1

## 2021-08-24 MED ORDER — LORAZEPAM 2 MG/ML IJ SOLN: 0.0000 mg | Freq: Four times a day (QID) | INTRAMUSCULAR | Status: DC

## 2021-08-24 MED ORDER — PANTOPRAZOLE SODIUM 40 MG PO TBEC
40.0000 mg | DELAYED_RELEASE_TABLET | Freq: Every day | ORAL | Status: DC
  Filled 2021-08-24: qty 1

## 2021-08-24 MED ORDER — LORAZEPAM 2 MG/ML IJ SOLN: 0.0000 mg | Freq: Two times a day (BID) | INTRAMUSCULAR | Status: DC

## 2021-08-24 MED ORDER — THIAMINE HCL 100 MG PO TABS
100.0000 mg | ORAL_TABLET | Freq: Every day | ORAL | Status: DC
  Administered 2021-08-25: 100 mg via ORAL
  Filled 2021-08-24: qty 1

## 2021-08-24 MED ORDER — LORAZEPAM 2 MG PO TABS: 0.0000 mg | ORAL_TABLET | Freq: Two times a day (BID) | ORAL | Status: DC

## 2021-08-24 MED ORDER — IBUPROFEN 600 MG PO TABS
600.0000 mg | ORAL_TABLET | Freq: Once | ORAL | Status: AC
  Administered 2021-08-24: 600 mg via ORAL
  Filled 2021-08-24: qty 1

## 2021-08-24 MED ORDER — NICOTINE 21 MG/24HR TD PT24
21.0000 mg | MEDICATED_PATCH | Freq: Once | TRANSDERMAL | Status: DC
  Administered 2021-08-24: 21 mg via TRANSDERMAL
  Filled 2021-08-24: qty 1

## 2021-08-24 MED ORDER — FINASTERIDE 2.5 MG HALF TABLET
2.5000 mg | ORAL_TABLET | Freq: Every day | ORAL | Status: DC
  Filled 2021-08-24: qty 1

## 2021-08-24 NOTE — Consult Note (Signed)
[facility] Consult   Reason for Consult:Alcohol Intoxication Referring Physician: Dr. Tamala Julian Patient Identification: Samuel Riley MRN:  [id number] Principal Diagnosis: <principal problem not specified> Diagnosis:  Active Problems:   Hypertension   Tobacco abuse   Hyperactivity disorder, predominantly hyperactive-impulsive type   Personal history of traumatic brain injury   DJD (degenerative joint disease) of knee   Alcohol abuse   Major depressive disorder with single episode   Total Time spent with patient: 1 hour  Subjective: "I will let my brother tell me what I should do." Samuel Riley is a 51 y.o. male patient presented to [facility] ED via POV voluntarily with his brother at his side. Per the ED triage nurse's note, Pt comes from home via [facility], where he is reported to have been on a binge since Friday. Pt family informed the same EMS. Pt arrives at ED and is intoxicated, has some dried blood under their nose, and reports he fell.  Pt drink of choice is Vodka.  Pt family has been in contact with the [facility] alcohol facility, and they reported to them that pt needs a medical and psychiatric evaluation before he can be accepted. The patient was assessed with his brother in the room. The patient was asked if he wanted his brother present. The patient stated, "My brother makes the decisions for me." The patient's brother reported that the patient had been binge drinking since 6.24.23. 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 The patient's brother shared that he is very good at his work. He states this is why he remains employed. The patient liquor of choice is Vodka. The last time the patient was sober was the end of 2021 to 2022, and he was sober for a year. The patient sort treatment in a facility in [place] for 26 days, and when he was discharged on his way back home, he began drinking on the plane. The patient states his withdrawal symptoms are tremors.   This provider saw The patient face-to-face; the chart was reviewed, and consulted with Dr. Tamala Julian on 08/24/2021 due to the patient's care. It was discussed with the EDP that the patient does not meet the criteria to be admitted to the psychiatric inpatient unit. Still, he could benefit from inpatient detox treatment for alcohol use disorder. On evaluation, the patient is alert and oriented x4, restless, cooperative, and mood-congruent with affect. The patient does not appear to be responding to internal or external stimuli. Neither is the patient presenting with any delusional thinking. The patient denies auditory or visual hallucinations. The patient denies any suicidal, homicidal, or self-harm ideations. The patient is not presenting with any psychotic or paranoid behaviors. During an encounter with the patient, he could answer questions appropriately.  HPI: Per Dr. Tamala Julian, Samuel Riley is a 51 y.o. male who presents to the ED for evaluation of Alcohol Intoxication I reviewed PCP visit from 6/13.  History of cocaine and ethanol abuse.  He reportedly did a 30-day inpatient rehab stay in [place] for alcohol abuse in May. Patient presents to the ED for evaluation medical clearance before going back to detox.  A local facility reports that they will accept him for inpatient detox for his ethanol if he is medically cleared to do so. He presents to the ED by EMS for this.  He reports he fell at some point but does not know when.  Reports is probably in the past couple days.  Denies any episodes of syncope, chest pain, fever, or  recent illnesses Denies any suicidal intent, hallucinations or homicidality.  Has never had a seizure withdrawing from alcohol in the past.  No coingestions beyond ethanol  Past Psychiatric History:  Alcohol abuse Anxiety   Risk to Self:   Risk to Others:   Prior Inpatient Therapy:   Prior Outpatient Therapy:    Past Medical History:  Past Medical History:  Diagnosis Date    Alcohol abuse    Anxiety    Atypical mole 02/10/2015   Right supra auricular scalp. Mild atypia, deep margin involved.    Basal cell carcinoma 03/14/2006   Mid vertex scalp.    Basal cell carcinoma 03/26/2007   Vertex scalp.    Basal cell carcinoma 01/11/2010   Scalp.    Basal cell carcinoma 11/05/2013   Left anterior deltoid. Superficial. EDC.   Basal cell carcinoma 10/20/2014   Left superior pectoral. Nodular.   Dyslipidemia    Dysplastic nevus 03/06/2017   Right proximal ant. lat. thigh. Mild atypia, limited margins free.   Dysplastic nevus 03/06/2017   Right mid ant. thigh. Mild atypia, limited margins free.   Dysplastic nevus 03/24/2019   Left low back paraspinal. Moderate atypia, close to margin.   Monocytosis     Past Surgical History:  Procedure Laterality Date   COLONOSCOPY WITH PROPOFOL N/A 03/17/2021   Procedure: COLONOSCOPY WITH PROPOFOL;  Surgeon: Jonathon Bellows, MD;  Location: [facility] ENDOSCOPY;  Service: Gastroenterology;  Laterality: N/A;   SPLENECTOMY, TOTAL N/A 2000   [facility]   Family History:  Family History  Problem Relation Age of Onset   Hypertension Father    Prostate cancer Father    Hypertension Brother    Family Psychiatric  History: Paternal -Alcoholism Social History:  Social History   Substance and Sexual Activity  Alcohol Use Yes   Comment: Fifth Per Day     Social History   Substance and Sexual Activity  Drug Use Not Currently   Types: Cocaine    Social History   Socioeconomic History   Marital status: Divorced    Spouse name: Not on file   Number of children: 1   Years of education: Not on file   Highest education level: Not on file  Occupational History   Occupation: Armed forces operational officer  Tobacco Use   Smoking status: Every Day    Packs/day: 0.50    Years: 30.00    Total pack years: 15.00    Types: Cigarettes   Smokeless tobacco: Never  Vaping Use   Vaping Use: Never used  Substance and Sexual Activity   Alcohol use: Yes     Comment: Fifth Per Day   Drug use: Not Currently    Types: Cocaine   Sexual activity: Yes    Partners: Female    Birth control/protection: Condom  Other Topics Concern   Not on file  Social History Narrative   Not on file   Social Determinants of Health   Financial Resource Strain: Not on file  Food Insecurity: Not on file  Transportation Needs: Not on file  Physical Activity: Not on file  Stress: Not on file  Social Connections: Not on file   Additional Social History:    Allergies:  No Known Allergies  Labs:  Results for orders placed or performed during the hospital encounter of 08/24/21 (from the past 48 hour(s))  Ethanol     Status: Abnormal   Collection Time: 08/24/21 10:02 PM  Result Value Ref Range   Alcohol, Ethyl (B) 302 (HH) <10 mg/dL  Comment: CRITICAL RESULT CALLED TO, READ BACK BY AND VERIFIED WITH CHRIS BECKNER AT 2307 ON 08/24/21 BY SS (NOTE) Lowest detectable limit for serum alcohol is 10 mg/dL.  For medical purposes only. Performed at [facility], [address] [place], [place] [address]   Salicylate level     Status: Abnormal   Collection Time: 08/24/21 10:02 PM  Result Value Ref Range   Salicylate Lvl <2.9 (L) 7.0 - 30.0 mg/dL    Comment: Performed at [facility], [address] [place], [place] [address]  Acetaminophen level     Status: Abnormal   Collection Time: 08/24/21 10:02 PM  Result Value Ref Range   Acetaminophen (Tylenol), Serum <10 (L) 10 - 30 ug/mL    Comment: (NOTE) Therapeutic concentrations vary significantly. A range of 10-30 ug/mL  may be an effective concentration for many patients. However, some  are best treated at concentrations outside of this range. Acetaminophen concentrations >150 ug/mL at 4 hours after ingestion  and >50 ug/mL at 12 hours after ingestion are often associated with  toxic reactions.  Performed at [facility], [address] [place], [place] [address]   CBC  with Differential/Platelet     Status: Abnormal   Collection Time: 08/24/21 10:02 PM  Result Value Ref Range   WBC 15.2 (H) 4.0 - 10.5 K/uL   RBC 5.22 4.22 - 5.81 MIL/uL   Hemoglobin 18.0 (H) 13.0 - 17.0 g/dL   HCT 50.8 39.0 - 52.0 %   MCV 97.3 80.0 - 100.0 fL   MCH 34.5 (H) 26.0 - 34.0 pg   MCHC 35.4 30.0 - 36.0 g/dL   RDW 12.8 11.5 - 15.5 %   Platelets 541 (H) 150 - 400 K/uL   nRBC 0.0 0.0 - 0.2 %   Neutrophils Relative % 73 %   Neutro Abs 11.1 (H) 1.7 - 7.7 K/uL   Lymphocytes Relative 18 %   Lymphs Abs 2.7 0.7 - 4.0 K/uL   Monocytes Relative 7 %   Monocytes Absolute 1.0 0.1 - 1.0 K/uL   Eosinophils Relative 0 %   Eosinophils Absolute 0.0 0.0 - 0.5 K/uL   Basophils Relative 1 %   Basophils Absolute 0.1 0.0 - 0.1 K/uL   Immature Granulocytes 1 %   Abs Immature Granulocytes 0.21 (H) 0.00 - 0.07 K/uL    Comment: Performed at [facility], [address] [place], [place] [address]  Comprehensive metabolic panel     Status: Abnormal   Collection Time: 08/24/21 10:02 PM  Result Value Ref Range   Sodium 138 135 - 145 mmol/L   Potassium 3.6 3.5 - 5.1 mmol/L   Chloride 99 98 - 111 mmol/L   CO2 25 22 - 32 mmol/L   Glucose, Bld 165 (H) 70 - 99 mg/dL    Comment: Glucose reference range applies only to samples taken after fasting for at least 8 hours.   BUN 11 6 - 20 mg/dL   Creatinine, Ser 0.94 0.61 - 1.24 mg/dL   Calcium 8.6 (L) 8.9 - 10.3 mg/dL   Total Protein 7.4 6.5 - 8.1 g/dL   Albumin 4.2 3.5 - 5.0 g/dL   AST 73 (H) 15 - 41 U/L   ALT 33 0 - 44 U/L   Alkaline Phosphatase 68 38 - 126 U/L   Total Bilirubin 1.0 0.3 - 1.2 mg/dL   GFR, Estimated >60 >60 mL/min    Comment: (NOTE) Calculated using the CKD-EPI Creatinine Equation (2021)  Anion gap 14 5 - 15    Comment: Performed at [facility], [address] [place], [place] [address]  Resp Panel by RT-PCR (Flu A&B, Covid) Anterior Nasal Swab     Status: None   Collection Time: 08/24/21 10:42 PM    Specimen: Anterior Nasal Swab  Result Value Ref Range   SARS Coronavirus 2 by RT PCR NEGATIVE NEGATIVE    Comment: (NOTE) SARS-CoV-2 target nucleic acids are NOT DETECTED.  The SARS-CoV-2 RNA is generally detectable in upper respiratory specimens during the acute phase of infection. The lowest concentration of SARS-CoV-2 viral copies this assay can detect is 138 copies/mL. A negative result does not preclude SARS-Cov-2 infection and should not be used as the sole basis for treatment or other patient management decisions. A negative result may occur with  improper specimen collection/handling, submission of specimen other than nasopharyngeal swab, presence of viral mutation(s) within the areas targeted by this assay, and inadequate number of viral copies(<138 copies/mL). A negative result must be combined with clinical observations, patient history, and epidemiological information. The expected result is Negative.  Fact Sheet for Patients:  EntrepreneurPulse.com.au  Fact Sheet for Healthcare Providers:  IncredibleEmployment.be  This test is no t yet approved or cleared by the [place] FDA and  has been authorized for detection and/or diagnosis of SARS-CoV-2 by FDA under an Emergency Use Authorization (EUA). This EUA will remain  in effect (meaning this test can be used) for the duration of the COVID-19 declaration under Section 564(b)(1) of the Act, 21 U.S.C.section 360bbb-3(b)(1), unless the authorization is terminated  or revoked sooner.       Influenza A by PCR NEGATIVE NEGATIVE   Influenza B by PCR NEGATIVE NEGATIVE    Comment: (NOTE) The Xpert Xpress SARS-CoV-2/FLU/RSV plus assay is intended as an aid in the diagnosis of influenza from Nasopharyngeal swab specimens and should not be used as a sole basis for treatment. Nasal washings and aspirates are unacceptable for Xpert Xpress SARS-CoV-2/FLU/RSV testing.  Fact Sheet for  Patients: EntrepreneurPulse.com.au  Fact Sheet for Healthcare Providers: IncredibleEmployment.be  This test is not yet approved or cleared by the [place] FDA and has been authorized for detection and/or diagnosis of SARS-CoV-2 by FDA under an Emergency Use Authorization (EUA). This EUA will remain in effect (meaning this test can be used) for the duration of the COVID-19 declaration under Section 564(b)(1) of the Act, 21 U.S.C. section 360bbb-3(b)(1), unless the authorization is terminated or revoked.  Performed at [facility], [address] [place], [place] [address]   Urine Drug Screen, [facility] only)     Status: None   Collection Time: 08/24/21 10:52 PM  Result Value Ref Range   Tricyclic, Ur Screen NONE DETECTED NONE DETECTED   Amphetamines, Ur Screen NONE DETECTED NONE DETECTED   MDMA (Ecstasy)Ur Screen NONE DETECTED NONE DETECTED   Cocaine Metabolite,Ur [place] NONE DETECTED NONE DETECTED   Opiate, Ur Screen NONE DETECTED NONE DETECTED   Phencyclidine (PCP) Ur S NONE DETECTED NONE DETECTED   Cannabinoid 50 Ng, Ur [place] NONE DETECTED NONE DETECTED   Barbiturates, Ur Screen NONE DETECTED NONE DETECTED   Benzodiazepine, Ur Scrn NONE DETECTED NONE DETECTED   Methadone Scn, Ur NONE DETECTED NONE DETECTED    Comment: (NOTE) Tricyclics + metabolites, urine    Cutoff 1000 ng/mL Amphetamines + metabolites, urine  Cutoff 1000 ng/mL MDMA (Ecstasy), urine              Cutoff 500 ng/mL Cocaine  Metabolite, urine          Cutoff 300 ng/mL Opiate + metabolites, urine        Cutoff 300 ng/mL Phencyclidine (PCP), urine         Cutoff 25 ng/mL Cannabinoid, urine                 Cutoff 50 ng/mL Barbiturates + metabolites, urine  Cutoff 200 ng/mL Benzodiazepine, urine              Cutoff 200 ng/mL Methadone, urine                   Cutoff 300 ng/mL  The urine drug screen provides only a preliminary, unconfirmed analytical test result  and should not be used for non-medical purposes. Clinical consideration and professional judgment should be applied to any positive drug screen result due to possible interfering substances. A more specific alternate chemical method must be used in order to obtain a confirmed analytical result. Gas chromatography / mass spectrometry (GC/MS) is the preferred confirm atory method. Performed at [facility], [address] [place], [place] [address]   Urinalysis, Routine w reflex microscopic     Status: Abnormal   Collection Time: 08/24/21 10:52 PM  Result Value Ref Range   Color, Urine YELLOW (A) YELLOW   APPearance CLEAR (A) CLEAR   Specific Gravity, Urine 1.014 1.005 - 1.030   pH 6.0 5.0 - 8.0   Glucose, UA NEGATIVE NEGATIVE mg/dL   Hgb urine dipstick NEGATIVE NEGATIVE   Bilirubin Urine NEGATIVE NEGATIVE   Ketones, ur NEGATIVE NEGATIVE mg/dL   Protein, ur 30 (A) NEGATIVE mg/dL   Nitrite NEGATIVE NEGATIVE   Leukocytes,Ua NEGATIVE NEGATIVE   RBC / HPF 0-5 0 - 5 RBC/hpf   WBC, UA 0-5 0 - 5 WBC/hpf   Bacteria, UA NONE SEEN NONE SEEN   Squamous Epithelial / LPF 0-5 0 - 5   Mucus PRESENT    Hyaline Casts, UA PRESENT    Amorphous Crystal PRESENT     Comment: Performed at [facility], [address], [place], [place] [address]    Current Facility-Administered Medications  Medication Dose Route Frequency Provider Last Rate Last Admin   [START ON 08/25/2021] amLODipine (NORVASC) tablet 10 mg  10 mg Oral Daily Ward, Kristen N, DO       [START ON 08/25/2021] finasteride (PROSCAR) tablet 2.5 mg  2.5 mg Oral Daily Ward, Kristen N, DO       lidocaine (XYLOCAINE) 2 % (with pres) injection 15 mg  0.75 mL Other Once Crecencio Mc, MD       LORazepam (ATIVAN) injection 0-4 mg  0-4 mg Intravenous Q6H Vladimir Crofts, MD       Or   LORazepam (ATIVAN) tablet 0-4 mg  0-4 mg Oral Q6H Vladimir Crofts, MD   2 mg at 08/24/21 2226   [START ON 08/27/2021] LORazepam (ATIVAN) injection 0-4  mg  0-4 mg Intravenous Q12H Vladimir Crofts, MD       Or   Derrill Memo ON 08/27/2021] LORazepam (ATIVAN) tablet 0-4 mg  0-4 mg Oral Q12H Vladimir Crofts, MD       nicotine (NICODERM CQ - dosed in mg/24 hours) patch 21 mg  21 mg Transdermal Once Vladimir Crofts, MD   21 mg at 08/24/21 2156   ondansetron (ZOFRAN-ODT) disintegrating tablet 4 mg  4 mg Oral Q8H PRN Ward, Kristen N, DO   4 mg at 08/24/21 2356   [START ON 08/25/2021]  pantoprazole (PROTONIX) EC tablet 40 mg  40 mg Oral Daily Ward, Kristen N, DO       [START ON 08/25/2021] sertraline (ZOLOFT) tablet 50 mg  50 mg Oral Daily Ward, Kristen N, DO       [START ON 08/25/2021] thiamine tablet 100 mg  100 mg Oral Daily Vladimir Crofts, MD       Or   Derrill Memo ON 08/25/2021] thiamine (B-1) injection 100 mg  100 mg Intravenous Daily Vladimir Crofts, MD       Current Outpatient Medications  Medication Sig Dispense Refill   ALPRAZolam (XANAX XR) 0.5 MG 24 hr tablet Take 1 tablet (0.5 mg total) by mouth daily as needed for anxiety. 30 tablet 2   amLODipine (NORVASC) 10 MG tablet Take 1 tablet (10 mg total) by mouth daily. 90 tablet 1   atomoxetine (STRATTERA) 10 MG capsule 2 CAPSULES IN THE AM,  1 IN THE PM 90 capsule 0   Calcipotriene-Betameth Diprop (ENSTILAR) 0.005-0.064 % FOAM Apply 1 application topically daily. 60 g 2   celecoxib (CELEBREX) 200 MG capsule TAKE 1 CAPSULE (200 MG TOTAL) BY MOUTH DAILY AFTER BREAKFAST. 30 capsule 3   cloNIDine (CATAPRES) 0.1 MG tablet TAKE 1 TABLET (0.1 MG TOTAL) BY MOUTH AT BEDTIME AS NEEDED. 30 tablet 2   disulfiram (ANTABUSE) 250 MG tablet Take 1 tablet (250 mg total) by mouth daily. 30 tablet 5   finasteride (PROPECIA) 1 MG tablet Take 1 mg by mouth daily.     omeprazole (PRILOSEC) 20 MG capsule Take 20 mg by mouth daily.     RA MELATONIN 10 MG TABS Take 1 tablet by mouth at bedtime.     sertraline (ZOLOFT) 50 MG tablet Take 1 tablet (50 mg total) by mouth daily. 90 tablet 3   Sodium Sulfate-Mag Sulfate-KCl (SUTAB) [phone] MG TABS  Take 1 kit by mouth as directed. 24 tablet 0    Musculoskeletal: Strength & Muscle Tone: within normal limits Gait & Station: normal Patient leans: N/A  Psychiatric Specialty Exam:  Presentation  General Appearance: Appropriate for Environment  Eye Contact:Good  Speech:Clear and Coherent  Speech Volume:Normal  Handedness:Right   Mood and Affect  Mood:Depressed  Affect:Blunt; Depressed   Thought Process  Thought Processes:Irrevelant  Descriptions of Associations:Loose  Orientation:Full (Time, Place and Person)  Thought Content:Scattered  History of Schizophrenia/Schizoaffective disorder:No data recorded Duration of Psychotic Symptoms:No data recorded Hallucinations:Hallucinations: None  Ideas of Reference:None  Suicidal Thoughts:Suicidal Thoughts: No  Homicidal Thoughts:Homicidal Thoughts: No   Sensorium  Memory:Immediate Fair; Recent Fair; Remote Fair  Judgment:Poor  Insight:Poor   Executive Functions  Concentration:Fair  Attention Span:Fair  [place]   Psychomotor Activity  Psychomotor Activity:Psychomotor Activity: Normal   Assets  Assets:Communication Skills; Desire for Improvement   Sleep  Sleep:Sleep: Fair   Physical Exam: Physical Exam Vitals and nursing note reviewed.  Constitutional:      Appearance: Normal appearance.  HENT:     Head: Normocephalic and atraumatic.     Right Ear: External ear normal.     Left Ear: External ear normal.     Nose: Nose normal.     Mouth/Throat:     Mouth: Mucous membranes are moist.  Cardiovascular:     Rate and Rhythm: Normal rate and regular rhythm.     Pulses: Normal pulses.  Pulmonary:     Effort: Pulmonary effort is normal.  Musculoskeletal:        General: Normal range of motion.  Cervical back: Normal range of motion and neck supple.  Neurological:     Mental Status: He is alert and oriented to person, place, and time.   Psychiatric:        Attention and Perception: Attention and perception normal.        Mood and Affect: Mood is anxious and depressed. Affect is blunt.        Speech: Speech is tangential.        Behavior: Behavior is withdrawn. Behavior is cooperative.        Thought Content: Thought content normal.        Cognition and Memory: Memory normal. Cognition is impaired.        Judgment: Judgment is impulsive and inappropriate.    Review of Systems  Psychiatric/Behavioral:  Positive for depression and substance abuse. The patient is nervous/anxious.   All other systems reviewed and are negative.  Blood pressure (!) 149/103, pulse 100, temperature 98.4 F (36.9 C), temperature source Oral, resp. rate 17, height 5' 8" (1.727 m), weight 77.1 kg, SpO2 98 %. Body mass index is 25.85 kg/m.  Treatment Plan Summary: Plan The patient meets the criteria for inpatient detox treatment for alcohol use disorder.  Disposition: No evidence of imminent risk to self or others at present.   Patient does not meet criteria for psychiatric inpatient admission. Supportive therapy provided about ongoing stressors. The patient meets the criteria for inpatient detox treatment for alcohol use disorder.  Caroline Sauger, NP 08/24/2021 11:57 PM

## 2021-08-24 NOTE — ED Provider Notes (Signed)
[facility] Provider Note    None    (approximate)   History   Alcohol Intoxication   HPI  Samuel Riley is a 51 y.o. male who presents to the ED for evaluation of Alcohol Intoxication   I reviewed PCP visit from 6/13.  History of cocaine and ethanol abuse.  He reportedly did a 30-day inpatient rehab stay in [place] for alcohol abuse in May.  Patient presents to the ED for evaluation medical clearance before going back to detox.  A local facility reports that they will accept him for inpatient detox for his ethanol if he is medically cleared to do so.  He presents to the ED by EMS for this.  He reports he fell at some point but does not know when.  Reports is probably in the past couple days.  Denies any episodes of syncope, chest pain, fever, or recent illnesses  Denies any suicidal intent, hallucinations or homicidality.  Has never had a seizure withdrawing from alcohol in the past.  No coingestions beyond ethanol   Physical Exam   Triage Vital Signs: ED Triage Vitals  Enc Vitals Group     BP      Pulse      Resp      Temp      Temp src      SpO2      Weight      Height      Head Circumference      Peak Flow      Pain Score      Pain Loc      Pain Edu?      Excl. in [place]?     Most recent vital signs: Vitals:   08/24/21 2152  BP: (!) 149/103  Pulse: 100  Resp: 17  Temp: 98.4 F (36.9 C)  SpO2: 98%    General: Awake, no distress.  Clinically intoxicated.  Pleasant CV:  Good peripheral perfusion. RRR Resp:  Normal effort. CTAB Abd:  No distention.  Soft and benign MSK:  Abrasion to the bridge of the nose and philtrum without discrete laceration.  No disruption of the vermilion border. No spinal step-offs or point bony tenderness to the back.  No signs of trauma to the back. Palpation of all 4 extremities without evidence of tenderness, deformity or trauma. Neuro:  No focal deficits appreciated. Cranial nerves II through XII  intact 5/5 strength and sensation in all 4 extremities Other:     ED Results / Procedures / Treatments   Labs (all labs ordered are listed, but only abnormal results are displayed) Labs Reviewed  CBC WITH DIFFERENTIAL/PLATELET - Abnormal; Notable for the following components:      Result Value   WBC 15.2 (*)    Hemoglobin 18.0 (*)    MCH 34.5 (*)    Platelets 541 (*)    Neutro Abs 11.1 (*)    Abs Immature Granulocytes 0.21 (*)    All other components within normal limits  RESP PANEL BY RT-PCR (FLU A&B, COVID) ARPGX2  ETHANOL  SALICYLATE LEVEL  ACETAMINOPHEN LEVEL  COMPREHENSIVE METABOLIC PANEL  URINE DRUG SCREEN, QUALITATIVE ([facility] ONLY)  URINALYSIS, ROUTINE W REFLEX MICROSCOPIC    EKG   RADIOLOGY CT head interpreted by me without evidence of acute intracranial pathology CT cervical spine interpreted by me without evidence of fracture or dislocation. CT max face interpreted by me without evidence of fracture  Official radiology report(s): CT Maxillofacial Wo Contrast  Result  Date: 08/24/2021 CLINICAL DATA:  Drunken face plant EXAM: CT MAXILLOFACIAL WITHOUT CONTRAST TECHNIQUE: Multidetector CT imaging of the maxillofacial structures was performed. Multiplanar CT image reconstructions were also generated. RADIATION DOSE REDUCTION: This exam was performed according to the departmental dose-optimization program which includes automated exposure control, adjustment of the mA and/or kV according to patient size and/or use of iterative reconstruction technique. COMPARISON:  None Available. FINDINGS: Osseous: No fracture or mandibular dislocation. No destructive process. Orbits: No fracture.  Globes intact.  No orbital emphysema. Sinuses: No air-fluid levels. Soft tissues: Negative Limited intracranial: See head CT report IMPRESSION: No facial or orbital fracture. Electronically Signed   By: Rolm Baptise M.D.   On: 08/24/2021 22:17   CT Cervical Spine Wo Contrast  Result Date:  08/24/2021 CLINICAL DATA:  Drunken face plant EXAM: CT CERVICAL SPINE WITHOUT CONTRAST TECHNIQUE: Multidetector CT imaging of the cervical spine was performed without intravenous contrast. Multiplanar CT image reconstructions were also generated. RADIATION DOSE REDUCTION: This exam was performed according to the departmental dose-optimization program which includes automated exposure control, adjustment of the mA and/or kV according to patient size and/or use of iterative reconstruction technique. COMPARISON:  None Available. FINDINGS: Alignment: Normal Skull base and vertebrae: No acute fracture. No primary bone lesion or focal pathologic process. Soft tissues and spinal canal: No prevertebral fluid or swelling. No visible canal hematoma. Disc levels: Early degenerative spurring anteriorly throughout the cervical spine. Mild bilateral degenerative facet disease. Upper chest: No acute findings Other: None IMPRESSION: No acute bony abnormality. Electronically Signed   By: Rolm Baptise M.D.   On: 08/24/2021 22:16   CT HEAD WO CONTRAST (5MM)  Result Date: 08/24/2021 CLINICAL DATA:  Drunken face plant EXAM: CT HEAD WITHOUT CONTRAST TECHNIQUE: Contiguous axial images were obtained from the base of the skull through the vertex without intravenous contrast. RADIATION DOSE REDUCTION: This exam was performed according to the departmental dose-optimization program which includes automated exposure control, adjustment of the mA and/or kV according to patient size and/or use of iterative reconstruction technique. COMPARISON:  None Available. FINDINGS: Brain: No acute intracranial abnormality. Specifically, no hemorrhage, hydrocephalus, mass lesion, acute infarction, or significant intracranial injury. Vascular: No hyperdense vessel or unexpected calcification. Skull: No acute calvarial abnormality. Sinuses/Orbits: No acute findings Other: None IMPRESSION: No acute intracranial abnormality. Electronically Signed   By: Rolm Baptise M.D.   On: 08/24/2021 22:15    PROCEDURES and INTERVENTIONS:  Procedures  Medications  nicotine (NICODERM CQ - dosed in mg/24 hours) patch 21 mg (21 mg Transdermal Patch Applied 08/24/21 2156)  LORazepam (ATIVAN) injection 0-4 mg ( Intravenous See Alternative 08/24/21 2226)    Or  LORazepam (ATIVAN) tablet 0-4 mg (2 mg Oral Given 08/24/21 2226)  LORazepam (ATIVAN) injection 0-4 mg (has no administration in time range)    Or  LORazepam (ATIVAN) tablet 0-4 mg (has no administration in time range)  thiamine tablet 100 mg (has no administration in time range)    Or  thiamine (B-1) injection 100 mg (has no administration in time range)  ibuprofen (ADVIL) tablet 600 mg (600 mg Oral Given 08/24/21 2226)     IMPRESSION / MDM / ASSESSMENT AND PLAN / ED COURSE  I reviewed the triage vital signs and the nursing notes.  Differential diagnosis includes, but is not limited to, alcohol tox, drug overdose, suicidality, polysubstance abuse  {Patient presents with symptoms of an acute illness or injury that is potentially life-threatening.  51 year old male presents to the ED voluntarily for medical  clearance prior to detox.  He is clinically intoxicated and has a small abrasion to his nose from a fall he reports happened at some point the past couple days.  He otherwise looks well without evidence of neurologic or vascular deficits.  No other signs of trauma.  His blood work is noted to have leukocytosis, but I doubt sepsis or infectious etiology of her symptoms.  All cell lines are up and I bet a degree of dehydration is to blame.  Furthermore, he always has a leukocytosis from 12-15 when reviewing his lab results from the past.  CT imaging is reassuring.  Blood work is slowly returning at time of signout to oncoming provider.  Awaiting psychiatric evaluation anticipate he will be suitable for a.m. detox.     FINAL CLINICAL IMPRESSION(S) / ED DIAGNOSES   Final diagnoses:  Alcoholic intoxication  without complication ([place])  Alcohol abuse     Rx / DC Orders   ED Discharge Orders     None        Note:  This document was prepared using Dragon voice recognition software and may include unintentional dictation errors.   Vladimir Crofts, MD 08/24/21 [phone]

## 2021-08-24 NOTE — ED Notes (Signed)
Pt is in CT at this time.

## 2021-08-24 NOTE — ED Notes (Signed)
Pt urinated while in CT and sample was not collected. Pt and family member aware of need for sample. Pt has drank 1 cup of water. Has another at hand

## 2021-08-24 NOTE — Consult Note (Incomplete)
[facility] Consult   Reason for Consult:  *** Referring Physician:  *** Patient Identification: Cardin Nitschke MRN:  [id number] Principal Diagnosis: <principal problem not specified> Diagnosis:  Active Problems:   Hypertension   Tobacco abuse   Hyperactivity disorder, predominantly hyperactive-impulsive type   Personal history of traumatic brain injury   DJD (degenerative joint disease) of knee   Alcohol abuse   Major depressive disorder with single episode   Total Time spent with patient: {Time; 15 min - 8 hours:17441}  Subjective:   Christoper Bushey is a 51 y.o. male patient admitted with ***.  HPI:  ***  Past Psychiatric History: ***  Risk to Self:   Risk to Others:   Prior Inpatient Therapy:   Prior Outpatient Therapy:    Past Medical History:  Past Medical History:  Diagnosis Date  . Alcohol abuse   . Anxiety   . Atypical mole 02/10/2015   Right supra auricular scalp. Mild atypia, deep margin involved.   . Basal cell carcinoma 03/14/2006   Mid vertex scalp.   . Basal cell carcinoma 03/26/2007   Vertex scalp.   . Basal cell carcinoma 01/11/2010   Scalp.   . Basal cell carcinoma 11/05/2013   Left anterior deltoid. Superficial. EDC.  . Basal cell carcinoma 10/20/2014   Left superior pectoral. Nodular.  . Dyslipidemia   . Dysplastic nevus 03/06/2017   Right proximal ant. lat. thigh. Mild atypia, limited margins free.  Marland Kitchen Dysplastic nevus 03/06/2017   Right mid ant. thigh. Mild atypia, limited margins free.  Marland Kitchen Dysplastic nevus 03/24/2019   Left low back paraspinal. Moderate atypia, close to margin.  Tarri Glenn     Past Surgical History:  Procedure Laterality Date  . COLONOSCOPY WITH PROPOFOL N/A 03/17/2021   Procedure: COLONOSCOPY WITH PROPOFOL;  Surgeon: Jonathon Bellows, MD;  Location: [facility] ENDOSCOPY;  Service: Gastroenterology;  Laterality: N/A;  . SPLENECTOMY, TOTAL N/A 2000   [facility]   Family History:  Family History  Problem Relation  Age of Onset  . Hypertension Father   . Prostate cancer Father   . Hypertension Brother    Family Psychiatric  History: *** Social History:  Social History   Substance and Sexual Activity  Alcohol Use Yes   Comment: Fifth Per Day     Social History   Substance and Sexual Activity  Drug Use Not Currently  . Types: Cocaine    Social History   Socioeconomic History  . Marital status: Divorced    Spouse name: Not on file  . Number of children: 1  . Years of education: Not on file  . Highest education level: Not on file  Occupational History  . Occupation: Armed forces operational officer  Tobacco Use  . Smoking status: Every Day    Packs/day: 0.50    Years: 30.00    Total pack years: 15.00    Types: Cigarettes  . Smokeless tobacco: Never  Vaping Use  . Vaping Use: Never used  Substance and Sexual Activity  . Alcohol use: Yes    Comment: Fifth Per Day  . Drug use: Not Currently    Types: Cocaine  . Sexual activity: Yes    Partners: Female    Birth control/protection: Condom  Other Topics Concern  . Not on file  Social History Narrative  . Not on file   Social Determinants of Health   Financial Resource Strain: Not on file  Food Insecurity: Not on file  Transportation Needs: Not on file  Physical Activity: Not on  file  Stress: Not on file  Social Connections: Not on file   Additional Social History:    Allergies:  No Known Allergies  Labs:  Results for orders placed or performed during the hospital encounter of 08/24/21 (from the past 48 hour(s))  Ethanol     Status: Abnormal   Collection Time: 08/24/21 10:02 PM  Result Value Ref Range   Alcohol, Ethyl (B) 302 (HH) <10 mg/dL    Comment: CRITICAL RESULT CALLED TO, READ BACK BY AND VERIFIED WITH CHRIS BECKNER AT 2307 ON 08/24/21 BY SS (NOTE) Lowest detectable limit for serum alcohol is 10 mg/dL.  For medical purposes only. Performed at [facility], [address] [place], [place] [address]   Salicylate  level     Status: Abnormal   Collection Time: 08/24/21 10:02 PM  Result Value Ref Range   Salicylate Lvl <4.7 (L) 7.0 - 30.0 mg/dL    Comment: Performed at [facility], [address] [place], [place] [address]  Acetaminophen level     Status: Abnormal   Collection Time: 08/24/21 10:02 PM  Result Value Ref Range   Acetaminophen (Tylenol), Serum <10 (L) 10 - 30 ug/mL    Comment: (NOTE) Therapeutic concentrations vary significantly. A range of 10-30 ug/mL  may be an effective concentration for many patients. However, some  are best treated at concentrations outside of this range. Acetaminophen concentrations >150 ug/mL at 4 hours after ingestion  and >50 ug/mL at 12 hours after ingestion are often associated with  toxic reactions.  Performed at [facility], [address] [place], [place] [address]   CBC with Differential/Platelet     Status: Abnormal   Collection Time: 08/24/21 10:02 PM  Result Value Ref Range   WBC 15.2 (H) 4.0 - 10.5 K/uL   RBC 5.22 4.22 - 5.81 MIL/uL   Hemoglobin 18.0 (H) 13.0 - 17.0 g/dL   HCT 50.8 39.0 - 52.0 %   MCV 97.3 80.0 - 100.0 fL   MCH 34.5 (H) 26.0 - 34.0 pg   MCHC 35.4 30.0 - 36.0 g/dL   RDW 12.8 11.5 - 15.5 %   Platelets 541 (H) 150 - 400 K/uL   nRBC 0.0 0.0 - 0.2 %   Neutrophils Relative % 73 %   Neutro Abs 11.1 (H) 1.7 - 7.7 K/uL   Lymphocytes Relative 18 %   Lymphs Abs 2.7 0.7 - 4.0 K/uL   Monocytes Relative 7 %   Monocytes Absolute 1.0 0.1 - 1.0 K/uL   Eosinophils Relative 0 %   Eosinophils Absolute 0.0 0.0 - 0.5 K/uL   Basophils Relative 1 %   Basophils Absolute 0.1 0.0 - 0.1 K/uL   Immature Granulocytes 1 %   Abs Immature Granulocytes 0.21 (H) 0.00 - 0.07 K/uL    Comment: Performed at [facility], [address] [place], [place] [address]  Comprehensive metabolic panel     Status: Abnormal   Collection Time: 08/24/21 10:02 PM  Result Value Ref Range   Sodium 138 135 - 145 mmol/L   Potassium 3.6  3.5 - 5.1 mmol/L   Chloride 99 98 - 111 mmol/L   CO2 25 22 - 32 mmol/L   Glucose, Bld 165 (H) 70 - 99 mg/dL    Comment: Glucose reference range applies only to samples taken after fasting for at least 8 hours.   BUN 11 6 - 20 mg/dL   Creatinine, Ser 0.94 0.61 - 1.24 mg/dL   Calcium 8.6 (L)  8.9 - 10.3 mg/dL   Total Protein 7.4 6.5 - 8.1 g/dL   Albumin 4.2 3.5 - 5.0 g/dL   AST 73 (H) 15 - 41 U/L   ALT 33 0 - 44 U/L   Alkaline Phosphatase 68 38 - 126 U/L   Total Bilirubin 1.0 0.3 - 1.2 mg/dL   GFR, Estimated >60 >60 mL/min    Comment: (NOTE) Calculated using the CKD-EPI Creatinine Equation (2021)    Anion gap 14 5 - 15    Comment: Performed at [facility], [address] [place], [place] [address]  Resp Panel by RT-PCR (Flu A&B, Covid) Anterior Nasal Swab     Status: None   Collection Time: 08/24/21 10:42 PM   Specimen: Anterior Nasal Swab  Result Value Ref Range   SARS Coronavirus 2 by RT PCR NEGATIVE NEGATIVE    Comment: (NOTE) SARS-CoV-2 target nucleic acids are NOT DETECTED.  The SARS-CoV-2 RNA is generally detectable in upper respiratory specimens during the acute phase of infection. The lowest concentration of SARS-CoV-2 viral copies this assay can detect is 138 copies/mL. A negative result does not preclude SARS-Cov-2 infection and should not be used as the sole basis for treatment or other patient management decisions. A negative result may occur with  improper specimen collection/handling, submission of specimen other than nasopharyngeal swab, presence of viral mutation(s) within the areas targeted by this assay, and inadequate number of viral copies(<138 copies/mL). A negative result must be combined with clinical observations, patient history, and epidemiological information. The expected result is Negative.  Fact Sheet for Patients:  EntrepreneurPulse.com.au  Fact Sheet for Healthcare Providers:   IncredibleEmployment.be  This test is no t yet approved or cleared by the [place] FDA and  has been authorized for detection and/or diagnosis of SARS-CoV-2 by FDA under an Emergency Use Authorization (EUA). This EUA will remain  in effect (meaning this test can be used) for the duration of the COVID-19 declaration under Section 564(b)(1) of the Act, 21 U.S.C.section 360bbb-3(b)(1), unless the authorization is terminated  or revoked sooner.       Influenza A by PCR NEGATIVE NEGATIVE   Influenza B by PCR NEGATIVE NEGATIVE    Comment: (NOTE) The Xpert Xpress SARS-CoV-2/FLU/RSV plus assay is intended as an aid in the diagnosis of influenza from Nasopharyngeal swab specimens and should not be used as a sole basis for treatment. Nasal washings and aspirates are unacceptable for Xpert Xpress SARS-CoV-2/FLU/RSV testing.  Fact Sheet for Patients: EntrepreneurPulse.com.au  Fact Sheet for Healthcare Providers: IncredibleEmployment.be  This test is not yet approved or cleared by the [place] FDA and has been authorized for detection and/or diagnosis of SARS-CoV-2 by FDA under an Emergency Use Authorization (EUA). This EUA will remain in effect (meaning this test can be used) for the duration of the COVID-19 declaration under Section 564(b)(1) of the Act, 21 U.S.C. section 360bbb-3(b)(1), unless the authorization is terminated or revoked.  Performed at [facility], [address] [place], [place] [address]   Urine Drug Screen, Qualitative [facility] only)     Status: None   Collection Time: 08/24/21 10:52 PM  Result Value Ref Range   Tricyclic, Ur Screen NONE DETECTED NONE DETECTED   Amphetamines, Ur Screen NONE DETECTED NONE DETECTED   MDMA (Ecstasy)Ur Screen NONE DETECTED NONE DETECTED   Cocaine Metabolite,Ur [place] NONE DETECTED NONE DETECTED   Opiate, Ur Screen NONE DETECTED NONE DETECTED   Phencyclidine (PCP)  Ur S NONE DETECTED NONE DETECTED   Cannabinoid  84 Ng, Ur [place] NONE DETECTED NONE DETECTED   Barbiturates, Ur Screen NONE DETECTED NONE DETECTED   Benzodiazepine, Ur Scrn NONE DETECTED NONE DETECTED   Methadone Scn, Ur NONE DETECTED NONE DETECTED    Comment: (NOTE) Tricyclics + metabolites, urine    Cutoff 1000 ng/mL Amphetamines + metabolites, urine  Cutoff 1000 ng/mL MDMA (Ecstasy), urine              Cutoff 500 ng/mL Cocaine Metabolite, urine          Cutoff 300 ng/mL Opiate + metabolites, urine        Cutoff 300 ng/mL Phencyclidine (PCP), urine         Cutoff 25 ng/mL Cannabinoid, urine                 Cutoff 50 ng/mL Barbiturates + metabolites, urine  Cutoff 200 ng/mL Benzodiazepine, urine              Cutoff 200 ng/mL Methadone, urine                   Cutoff 300 ng/mL  The urine drug screen provides only a preliminary, unconfirmed analytical test result and should not be used for non-medical purposes. Clinical consideration and professional judgment should be applied to any positive drug screen result due to possible interfering substances. A more specific alternate chemical method must be used in order to obtain a confirmed analytical result. Gas chromatography / mass spectrometry (GC/MS) is the preferred confirm atory method. Performed at [facility], [address] [place], [place] [address]   Urinalysis, Routine w reflex microscopic     Status: Abnormal   Collection Time: 08/24/21 10:52 PM  Result Value Ref Range   Color, Urine YELLOW (A) YELLOW   APPearance CLEAR (A) CLEAR   Specific Gravity, Urine 1.014 1.005 - 1.030   pH 6.0 5.0 - 8.0   Glucose, UA NEGATIVE NEGATIVE mg/dL   Hgb urine dipstick NEGATIVE NEGATIVE   Bilirubin Urine NEGATIVE NEGATIVE   Ketones, ur NEGATIVE NEGATIVE mg/dL   Protein, ur 30 (A) NEGATIVE mg/dL   Nitrite NEGATIVE NEGATIVE   Leukocytes,Ua NEGATIVE NEGATIVE   RBC / HPF 0-5 0 - 5 RBC/hpf   WBC, UA 0-5 0 - 5 WBC/hpf   Bacteria, UA  NONE SEEN NONE SEEN   Squamous Epithelial / LPF 0-5 0 - 5   Mucus PRESENT    Hyaline Casts, UA PRESENT    Amorphous Crystal PRESENT     Comment: Performed at [facility], [address] [place], [place] [address]    Current Facility-Administered Medications  Medication Dose Route Frequency Provider Last Rate Last Admin  . [START ON 08/25/2021] amLODipine (NORVASC) tablet 10 mg  10 mg Oral Daily Ward, Kristen N, DO      . [START ON 08/25/2021] finasteride (PROSCAR) tablet 2.5 mg  2.5 mg Oral Daily Ward, Kristen N, DO      . lidocaine (XYLOCAINE) 2 % (with pres) injection 15 mg  0.75 mL Other Once Crecencio Mc, MD      . LORazepam (ATIVAN) injection 0-4 mg  0-4 mg Intravenous Q6H Vladimir Crofts, MD       Or  . LORazepam (ATIVAN) tablet 0-4 mg  0-4 mg Oral Q6H Vladimir Crofts, MD   2 mg at 08/24/21 2226  . [START ON 08/27/2021] LORazepam (ATIVAN) injection 0-4 mg  0-4 mg Intravenous Q12H Vladimir Crofts, MD       Or  . Derrill Memo  ON 08/27/2021] LORazepam (ATIVAN) tablet 0-4 mg  0-4 mg Oral Q12H Vladimir Crofts, MD      . nicotine (NICODERM CQ - dosed in mg/24 hours) patch 21 mg  21 mg Transdermal Once Vladimir Crofts, MD   21 mg at 08/24/21 2156  . ondansetron (ZOFRAN-ODT) disintegrating tablet 4 mg  4 mg Oral Q8H PRN Ward, Kristen N, DO   4 mg at 08/24/21 2356  . [START ON 08/25/2021] pantoprazole (PROTONIX) EC tablet 40 mg  40 mg Oral Daily Ward, Kristen N, DO      . [START ON 08/25/2021] sertraline (ZOLOFT) tablet 50 mg  50 mg Oral Daily Ward, Kristen N, DO      . [START ON 08/25/2021] thiamine tablet 100 mg  100 mg Oral Daily Vladimir Crofts, MD       Or  . Derrill Memo ON 08/25/2021] thiamine (B-1) injection 100 mg  100 mg Intravenous Daily Vladimir Crofts, MD       Current Outpatient Medications  Medication Sig Dispense Refill  . ALPRAZolam (XANAX XR) 0.5 MG 24 hr tablet Take 1 tablet (0.5 mg total) by mouth daily as needed for anxiety. 30 tablet 2  . amLODipine (NORVASC) 10 MG tablet Take 1 tablet (10 mg  total) by mouth daily. 90 tablet 1  . atomoxetine (STRATTERA) 10 MG capsule 2 CAPSULES IN THE AM,  1 IN THE PM 90 capsule 0  . Calcipotriene-Betameth Diprop (ENSTILAR) 0.005-0.064 % FOAM Apply 1 application topically daily. 60 g 2  . celecoxib (CELEBREX) 200 MG capsule TAKE 1 CAPSULE (200 MG TOTAL) BY MOUTH DAILY AFTER BREAKFAST. 30 capsule 3  . cloNIDine (CATAPRES) 0.1 MG tablet TAKE 1 TABLET (0.1 MG TOTAL) BY MOUTH AT BEDTIME AS NEEDED. 30 tablet 2  . disulfiram (ANTABUSE) 250 MG tablet Take 1 tablet (250 mg total) by mouth daily. 30 tablet 5  . finasteride (PROPECIA) 1 MG tablet Take 1 mg by mouth daily.    Marland Kitchen omeprazole (PRILOSEC) 20 MG capsule Take 20 mg by mouth daily.    Marland Kitchen RA MELATONIN 10 MG TABS Take 1 tablet by mouth at bedtime.    . sertraline (ZOLOFT) 50 MG tablet Take 1 tablet (50 mg total) by mouth daily. 90 tablet 3  . Sodium Sulfate-Mag Sulfate-KCl (SUTAB) [phone] MG TABS Take 1 kit by mouth as directed. 24 tablet 0    Musculoskeletal: Strength & Muscle Tone: {desc; muscle tone:32375} Gait & Station: {PE GAIT ED PNTI:14431} Patient leans: {Patient Leans:[id number]}            Psychiatric Specialty Exam:  Presentation  General Appearance: Appropriate for Environment  Eye Contact:Good  Speech:Clear and Coherent  Speech Volume:Normal  Handedness:Right   Mood and Affect  Mood:Depressed  Affect:Blunt; Depressed   Thought Process  Thought Processes:Irrevelant  Descriptions of Associations:Loose  Orientation:Full (Time, Place and Person)  Thought Content:Scattered  History of Schizophrenia/Schizoaffective disorder:No data recorded Duration of Psychotic Symptoms:No data recorded Hallucinations:Hallucinations: None  Ideas of Reference:None  Suicidal Thoughts:Suicidal Thoughts: No  Homicidal Thoughts:Homicidal Thoughts: No   Sensorium  Memory:Immediate Fair; Recent Fair; Remote Fair  Judgment:Poor  Insight:Poor   Executive Functions   Concentration:Fair  Attention Span:Fair  [place]   Psychomotor Activity  Psychomotor Activity:Psychomotor Activity: Normal   Assets  Assets:Communication Skills; Desire for Improvement   Sleep  Sleep:Sleep: Fair   Physical Exam: Physical Exam Vitals and nursing note reviewed.  Constitutional:      Appearance: Normal appearance.  HENT:  Head: Normocephalic and atraumatic.     Right Ear: External ear normal.     Left Ear: External ear normal.     Nose: Nose normal.     Mouth/Throat:     Mouth: Mucous membranes are moist.  Cardiovascular:     Rate and Rhythm: Normal rate and regular rhythm.     Pulses: Normal pulses.  Pulmonary:     Effort: Pulmonary effort is normal.  Musculoskeletal:        General: Normal range of motion.     Cervical back: Normal range of motion and neck supple.  Neurological:     Mental Status: He is alert and oriented to person, place, and time.  Psychiatric:        Attention and Perception: Attention and perception normal.        Mood and Affect: Mood is anxious and depressed. Affect is blunt.        Speech: Speech is tangential.        Behavior: Behavior is withdrawn. Behavior is cooperative.        Thought Content: Thought content normal.        Cognition and Memory: Memory normal. Cognition is impaired.        Judgment: Judgment is impulsive and inappropriate.    Review of Systems  Psychiatric/Behavioral:  Positive for depression and substance abuse. The patient is nervous/anxious.   All other systems reviewed and are negative.  Blood pressure (!) 149/103, pulse 100, temperature 98.4 F (36.9 C), temperature source Oral, resp. rate 17, height '5\' 8"'  (1.727 m), weight 77.1 kg, SpO2 98 %. Body mass index is 25.85 kg/m.  Treatment Plan Summary: Plan    Disposition: {CHL [facility] Consult IHWT:88828}  Caroline Sauger, NP 08/24/2021 11:57 PM

## 2021-08-24 NOTE — ED Triage Notes (Signed)
Pt comes from home via [facility] where he is reported to have been on binge since Friday. Pt family informed the same to EMS. Pt arrives to ED and is intoxicated, has some dried blood under nose, reports he fell.  Pt drink of choice is vodka.   Pt family has been in contact with [facility] and they reported to them that pt needs a medical and psychiatric evaluation before he can be accepted.

## 2021-08-25 DIAGNOSIS — F1010 Alcohol abuse, uncomplicated: Secondary | ICD-10-CM

## 2021-08-25 MED ORDER — ALUM & MAG HYDROXIDE-SIMETH 200-200-20 MG/5ML PO SUSP
30.0000 mL | ORAL | Status: DC | PRN
  Administered 2021-08-25: 30 mL via ORAL
  Filled 2021-08-25: qty 30

## 2021-08-25 MED ORDER — LORAZEPAM 2 MG/ML IJ SOLN
1.0000 mg | Freq: Once | INTRAMUSCULAR | Status: AC
  Administered 2021-08-25: 1 mg via INTRAVENOUS
  Filled 2021-08-25: qty 1

## 2021-08-25 MED ORDER — LORAZEPAM 2 MG/ML IJ SOLN: 2.0000 mg | Freq: Once | INTRAMUSCULAR | Status: DC

## 2021-08-25 MED ORDER — PANTOPRAZOLE SODIUM 40 MG PO TBEC
40.0000 mg | DELAYED_RELEASE_TABLET | Freq: Once | ORAL | Status: AC
  Administered 2021-08-25: 40 mg via ORAL
  Filled 2021-08-25: qty 1

## 2021-08-25 NOTE — BH Assessment (Addendum)
Referral information for Detox treatment faxed to;   [facility] [phone]) (Fax#-[phone]-[phone]) No available beds tonight, check back tomorrow  [facility] [phone])  [facility] [phone]) Staff reports 1 male bed available, unclear if patient has insurance or not as it is listed as Production manager. If patient has insurance he is unable to be accepted. Staff reports that patient can present to the facility tomorrow at 1:30pm or 5pm but cannot be intoxicated upon arrival. [facility] will fax information for staff to review incase the insurance doesn't go through Facility contacted back at 1:30am, referral received. Staff needed to confirm if the patient's insurance has expired in order to be accepted. TTS contacted registration, registration reports that patient did in fact come in with a insurance card and that it's active. [facility] contacted [facility] back, [facility] unable to accept patient due to patient having active insurance.  [facility] [phone] -[phone]), Under review with Izola Price

## 2021-08-25 NOTE — ED Notes (Signed)
This Probation officer [facility], for [facility] in am

## 2021-08-25 NOTE — ED Notes (Signed)
Attempted to call report with no answer

## 2021-08-25 NOTE — ED Notes (Signed)
This RN received report from [place], Therapist, sports. Pt calm and cooperative. Pt denies any needs at this time. This RN will call report to after 9am. Pt understands POC.

## 2021-08-25 NOTE — ED Notes (Signed)
Dietary called to send up missing breakfast tray

## 2021-08-25 NOTE — ED Provider Notes (Addendum)
[facility] accepted to [facility] in the morning for alcohol detox.  Currently medically cleared.  Patient's family was concerned about his leukocytosis of 15,000.  No infectious symptoms.  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  Discussed this with patient and that he can follow-up with his primary care doctor as an outpatient but needs no further emergent work-up for this chronic issue.   [place] Athanas, Delice Bison, DO 08/25/21 0302   Patient continues to be stable without any signs of significant withdrawal.  Medically cleared and plan is to go to [facility] this morning for alcohol detox.  Patient here voluntarily.  Family at bedside.   [place] Patella, Delice Bison, DO 08/25/21 [phone]

## 2021-08-25 NOTE — ED Notes (Signed)
This RN called [name] again. Representative answered and stated [name] is still doing med pass and should call this RN back within an hour. Representative stating not other staff available for this RN to give report to.

## 2021-08-25 NOTE — ED Notes (Signed)
Pt family at beside and proved pt with breakfast

## 2021-08-25 NOTE — ED Notes (Signed)
Attempted to call report again and phone was answered and the person on the other line hung up on this RN without communication

## 2021-08-25 NOTE — ED Notes (Signed)
Pt given breakfast tray. Pt denies drink and has drink brought by brother at bedside.

## 2021-08-25 NOTE — ED Notes (Signed)
Pt in hallway on phone talking to family. This RN removed pt's IV a this time. Pt calm and cooperative and remains on phone.

## 2021-08-25 NOTE — BH Assessment (Signed)
Comprehensive Clinical Assessment (CCA) Note  08/25/2021 Samuel Riley [id number]  Chief Complaint: Patient is a 51 year old male presenting to [facility] ED voluntarily. Per triage note Pt comes from home via [facility] where he is reported to have been on binge since Friday. Pt family informed the same to EMS. Pt arrives to ED and is intoxicated, has some dried blood under nose, reports he fell.  Pt drink of choice is vodka. During assessment patient appears alert and oriented x4, calm and cooperative, appears to be under the influence of alcohol. 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 Patient denies SI/HI/AH/VH Chief Complaint  Patient presents with   Alcohol Intoxication   Visit Diagnosis: Alcohol use disorder, severe. Depression    CCA Screening, Triage and Referral (STR)  Patient Reported Information How did you hear about [place]? Self  Referral name: No data recorded Referral phone number: No data recorded  Whom do you see for routine medical problems? No data recorded Practice/Facility Name: No data recorded Practice/Facility Phone Number: No data recorded Name of Contact: No data recorded Contact Number: No data recorded Contact Fax Number: No data recorded Prescriber Name: No data recorded Prescriber Address (if known): No data recorded  What Is the Reason for Your Visit/Call Today? Patient presents voluntarily with his brother requesting alcohol detox  How Long Has This Been Causing You Problems? > than 6  months  What Do You Feel Would Help You the Most Today? Alcohol or Drug Use Treatment   Have You Recently Been in Any Inpatient Treatment (Hospital/Detox/Crisis Center/28-Day Program)? No data recorded Name/Location of Program/Hospital:No data recorded How Long Were You There? No data recorded When Were You Discharged? No data recorded  Have You Ever Received Services From [facility] Before? No data recorded Who Do You See at [facility]? No data recorded  Have You Recently Had Any Thoughts About Hurting Yourself? No  Are You Planning to Commit Suicide/Harm Yourself At This time? No   Have you Recently Had Thoughts About [place]? No  Explanation: No data recorded  Have You Used Any Alcohol or Drugs in the Past 24 Hours? Yes  How Long Ago Did You Use Drugs or Alcohol? No data recorded What Did You Use and How Much? Alcohol "a 5th of vodka"   Do You Currently Have a Therapist/Psychiatrist? No  Name of Therapist/Psychiatrist: No data recorded  Have You Been Recently Discharged From Any Office Practice or Programs? No  Explanation of Discharge From Practice/Program: No data recorded    CCA Screening Triage Referral Assessment Type of Contact: Face-to-Face  Is this Initial or Reassessment? No data recorded Date Telepsych consult ordered in [facility]:  No data recorded Time Telepsych consult ordered in [facility]:  No data recorded  Patient Reported Information Reviewed? No data recorded Patient Left Without Being Seen? No data recorded Reason for Not Completing Assessment: No data recorded  Collateral Involvement: No data recorded  Does Patient Have a [place]? No data recorded Name and Contact of  Legal Guardian: No data recorded If Minor and Not Living with Parent(s), Who has Custody? No data recorded Is CPS involved or ever been involved? Never  Is APS involved or ever been involved? Never   Patient Determined To Be At Risk for Harm To  Self or Others Based on Review of Patient Reported Information or Presenting Complaint? No  Method: No data recorded Availability of Means: No data recorded Intent: No data recorded Notification Required: No data recorded Additional Information for Danger to Others Potential: No data recorded Additional Comments for Danger to Others Potential: No data recorded Are There Guns or Other Weapons in Your Home? No data recorded Types of Guns/Weapons: No data recorded Are These Weapons Safely Secured?                            No data recorded Who Could Verify You Are Able To Have These Secured: No data recorded Do You Have any Outstanding Charges, Pending Court Dates, Parole/Probation? No data recorded Contacted To Inform of Risk of Harm To Self or Others: No data recorded  Location of Assessment: [facility] ED   Does Patient Present under Involuntary Commitment? No  IVC Papers Initial File Date: No data recorded  [place] of Residence: [place]   Patient Currently Receiving the Following Services: No data recorded  Determination of Need: Emergent (2 hours)   Options For Referral: No data recorded    CCA Biopsychosocial Intake/Chief Complaint:  No data recorded Current Symptoms/Problems: No data recorded  Patient Reported Schizophrenia/Schizoaffective Diagnosis in Past: No   Strengths: Patient is able to communicate  Preferences: No data recorded Abilities: No data recorded  Type of Services Patient Feels are Needed: No data recorded  Initial Clinical Notes/Concerns: No data recorded  Mental Health Symptoms Depression:   Change in energy/activity; Difficulty Concentrating; Fatigue   Duration of Depressive symptoms:  Greater than two weeks   Mania:   None   Anxiety:    None   Psychosis:   None   Duration of Psychotic symptoms: No data recorded  Trauma:   None   Obsessions:   None   Compulsions:   None   Inattention:   None   Hyperactivity/Impulsivity:    None   Oppositional/Defiant Behaviors:   None   Emotional Irregularity:   None   Other Mood/Personality Symptoms:  No data recorded   Mental Status Exam Appearance and self-care  Stature:   Average   Weight:   Average weight   Clothing:   Casual   Grooming:   Normal   Cosmetic use:   None   Posture/gait:   Normal   Motor activity:   Not Remarkable   Sensorium  Attention:   Normal   Concentration:   Normal   Orientation:   X5   Recall/memory:   Normal   Affect and Mood  Affect:   Appropriate   Mood:   Depressed   Relating  Eye contact:   Normal   Facial expression:   Responsive   Attitude toward examiner:   Cooperative   Thought and Language  Speech flow:  Clear and Coherent   Thought content:   Appropriate to Mood and Circumstances   Preoccupation:   None   Hallucinations:   None   Organization:  No data recorded  [employer] of Knowledge:   Good   Intelligence:   Average   Abstraction:   Normal   Judgement:  Fair   Art therapist:   Realistic   Insight:   Fair   Decision Making:   Normal   Social Functioning  Social Maturity:   Responsible   Social Judgement:   Normal   Stress  Stressors:   Family conflict; Work   Coping Ability:   Normal   Skill Deficits:   None   Supports:   Family     Religion: Religion/Spirituality Are You A Religious Person?: No  Leisure/Recreation: Leisure / Recreation Do You Have Hobbies?: No  Exercise/Diet: Exercise/Diet Do You Exercise?: No Have You Gained or Lost A Significant Amount of Weight in the Past Six Months?: No Do You Follow a Special Diet?: No Do You Have Any Trouble Sleeping?: No   CCA Employment/Education Employment/Work Situation: Employment / Work Situation Employment Situation: Employed Work Stressors: None reported Patient's Job has Been Impacted by Current Illness: No Has Patient ever Been in Passenger transport manager?:  No  Education: Education Is Patient Currently Attending School?: No Did You Have An Individualized Education Program (IIEP): No Did You Have Any Difficulty At [employer]?: No Patient's Education Has Been Impacted by Current Illness: No   CCA Family/Childhood History Family and Relationship History: Family history Marital status: Single Does patient have children?: Yes How many children?: 1 How is patient's relationship with their children?: Patient reports "a better relationship than it was"  Childhood History:  Childhood History By whom was/is the patient raised?: Both parents Did patient suffer any verbal/emotional/physical/sexual abuse as a child?: No Did patient suffer from severe childhood neglect?: No Has patient ever been sexually abused/assaulted/raped as an adolescent or adult?: No Was the patient ever a victim of a crime or a disaster?: No Witnessed domestic violence?: No Has patient been affected by domestic violence as an adult?: No  Child/Adolescent Assessment:     CCA Substance Use Alcohol/Drug Use: Alcohol / Drug Use Pain Medications: See MAR Prescriptions: See MAR Over the Counter: See MAR History of alcohol / drug use?: Yes Withdrawal Symptoms: Change in blood pressure Substance #1 Name of Substance 1: Alcohol 1 - Age of First Use: Unknown 1 - Amount (size/oz): "a 5th of vodka" 1 - Frequency: daily, binge drinking 1 - Duration: 1 year 1 - Last Use / Amount: 08/25/21 1- Route of Use: Oral                       ASAM's:  Six Dimensions of Multidimensional Assessment  Dimension 1:  Acute Intoxication and/or Withdrawal Potential:      Dimension 2:  Biomedical Conditions and Complications:      Dimension 3:  Emotional, Behavioral, or Cognitive Conditions and Complications:     Dimension 4:  Readiness to Change:     Dimension 5:  Relapse, Continued use, or Continued Problem Potential:     Dimension 6:  Recovery/Living Environment:     ASAM  Severity Score:    ASAM Recommended Level of Treatment: ASAM Recommended Level of Treatment: Level III Residential Treatment   Substance use Disorder (SUD) Substance Use Disorder (SUD)  Checklist Symptoms of Substance Use: Continued use despite having a persistent/recurrent physical/psychological problem caused/exacerbated by use, Continued use despite persistent or recurrent social, interpersonal problems, caused or exacerbated by use, Evidence of tolerance, Large amounts of time spent to obtain, use or recover from the substance(s), Persistent desire or unsuccessful efforts to cut down or control use, Presence of craving or strong urge to use, Recurrent use that results in a failure to  fulfill major role obligations (work, school, home), Repeated use in physically hazardous situations, Social, occupational, recreational activities given up or reduced due to use, Substance(s) often taken in larger amounts or over longer times than was intended  Recommendations for Services/Supports/Treatments: Recommendations for Services/Supports/Treatments Recommendations For Services/Supports/Treatments: Detox, IOP (Intensive Outpatient Program)  DSM5 Diagnoses: Patient Active Problem List   Diagnosis Date Noted   Major depressive disorder with single episode 08/08/2021   Alcohol abuse 03/10/2021   Dyslipidemia 06/12/2017   Monocytosis 06/11/2017   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/12/2016   Encounter for preventive health examination 05/22/2016   Screen for STD (sexually transmitted disease) 05/22/2016   Encounter for screening for lung cancer 05/22/2016   B12 deficiency 05/21/2016   Midline thoracic back pain 06/29/2015   Tobacco abuse 05/19/2015   Hyperactivity disorder, predominantly hyperactive-impulsive type 05/19/2015   Personal history of traumatic brain injury 05/19/2015   S/P splenectomy 05/19/2015   Hypertension 08/26/2004    Patient Centered Plan: Patient is on the following  Treatment Plan(s):  Depression and Substance Abuse   Referrals to Alternative Service(s): Referred to Alternative Service(s):   Place:   Date:   Time:    Referred to Alternative Service(s):   Place:   Date:   Time:    Referred to Alternative Service(s):   Place:   Date:   Time:    Referred to Alternative Service(s):   Place:   Date:   Time:      '@BHCOLLABOFCARE'$ @  [employer], LCAS-A

## 2021-08-25 NOTE — ED Notes (Signed)
Pt up to bathroom with steady gait.

## 2021-08-25 NOTE — ED Notes (Signed)
Attempted to call report to [facility]. Nurse unavailable for report. Gave representative this RNs callback number.

## 2021-08-25 NOTE — BH Assessment (Signed)
PATIENT BED AVAILABLE AFTER 9AM  Patient has been accepted to [facility] Patient assigned to [facility] Accepting physician is Dr. Abbey Chatters.  Call report to [phone].  Representative was [employer].   ER Staff is aware of it:  Melody ER Secretary  Dr. Leonides Schanz, ER MD  Minette Brine Patient's Nurse     Patient's Family/Support System Lennette Bihari Stebbins-patient's brother) have been updated as well.

## 2021-08-25 NOTE — ED Notes (Signed)
VOL/pending transport to [facility] after [employer]

## 2021-08-25 NOTE — ED Notes (Signed)
Pt on way to lobby as [facility] waiting for him there. Pt was in rush and forgot to sign for paperwork.

## 2021-08-25 NOTE — ED Notes (Signed)
Called [facility] for transport to [place]

## 2021-09-05 ENCOUNTER — Other Ambulatory Visit: Payer: Self-pay | Admitting: Internal Medicine

## 2021-09-07 ENCOUNTER — Other Ambulatory Visit: Payer: Self-pay | Admitting: Internal Medicine

## 2021-09-26 ENCOUNTER — Other Ambulatory Visit: Payer: Self-pay | Admitting: Internal Medicine

## 2021-09-28 ENCOUNTER — Other Ambulatory Visit: Payer: Self-pay | Admitting: Internal Medicine

## 2021-10-13 ENCOUNTER — Other Ambulatory Visit: Payer: Self-pay

## 2021-10-13 ENCOUNTER — Emergency Department: Payer: PRIVATE HEALTH INSURANCE

## 2021-10-13 ENCOUNTER — Emergency Department
Admission: EM | Admit: 2021-10-13 | Discharge: 2021-10-13 | Disposition: A | Discharge status: Home / Self Care | Payer: PRIVATE HEALTH INSURANCE | Attending: Student in an Organized Health Care Education/Training Program | Admitting: Student in an Organized Health Care Education/Training Program

## 2021-10-13 DIAGNOSIS — F10229 Alcohol dependence with intoxication, unspecified: Secondary | ICD-10-CM | POA: Insufficient documentation

## 2021-10-13 DIAGNOSIS — Y908 Blood alcohol level of 240 mg/100 ml or more: Secondary | ICD-10-CM | POA: Insufficient documentation

## 2021-10-13 DIAGNOSIS — R000 Tachycardia, unspecified: Secondary | ICD-10-CM | POA: Diagnosis not present

## 2021-10-13 DIAGNOSIS — R079 Chest pain, unspecified: Secondary | ICD-10-CM | POA: Insufficient documentation

## 2021-10-13 DIAGNOSIS — R519 Headache, unspecified: Secondary | ICD-10-CM | POA: Insufficient documentation

## 2021-10-13 DIAGNOSIS — F1029 Alcohol dependence with unspecified alcohol-induced disorder: Secondary | ICD-10-CM | POA: Insufficient documentation

## 2021-10-13 LAB — COMPREHENSIVE METABOLIC PANEL
ALT: 24 U/L (ref 0–44)
AST: 29 U/L (ref 15–41)
Albumin: 3.9 g/dL (ref 3.5–5.0)
Alkaline Phosphatase: 72 U/L (ref 38–126)
Anion gap: 13 (ref 5–15)
BUN: 17 mg/dL (ref 6–20)
CO2: 21 mmol/L — ABNORMAL LOW (ref 22–32)
Calcium: 8 mg/dL — ABNORMAL LOW (ref 8.9–10.3)
Chloride: 101 mmol/L (ref 98–111)
Creatinine, Ser: 0.9 mg/dL (ref 0.61–1.24)
GFR, Estimated: >60 mL/min (ref >60)
Glucose, Bld: 193 mg/dL — ABNORMAL HIGH (ref 70–99)
Potassium: 3.4 mmol/L — ABNORMAL LOW (ref 3.5–5.1)
Sodium: 135 mmol/L (ref 135–145)
Total Bilirubin: 0.6 mg/dL (ref 0.3–1.2)
Total Protein: 6.6 g/dL (ref 6.5–8.1)

## 2021-10-13 LAB — CBC WITH DIFFERENTIAL/PLATELET
Abs Immature Granulocytes: 0.12 10*3/uL — ABNORMAL HIGH (ref 0.00–0.07)
Basophils Absolute: 0.1 10*3/uL (ref 0.0–0.1)
Basophils Relative: 1 %
Eosinophils Absolute: 0.1 10*3/uL (ref 0.0–0.5)
Eosinophils Relative: 0 %
HCT: 45.3 % (ref 39.0–52.0)
Hemoglobin: 16.1 g/dL (ref 13.0–17.0)
Immature Granulocytes: 1 %
Lymphocytes Relative: 25 %
Lymphs Abs: 3.5 10*3/uL (ref 0.7–4.0)
MCH: 33.8 pg (ref 26.0–34.0)
MCHC: 35.5 g/dL (ref 30.0–36.0)
MCV: 95 fL (ref 80.0–100.0)
Monocytes Absolute: 1 10*3/uL (ref 0.1–1.0)
Monocytes Relative: 7 %
Neutro Abs: 9.1 10*3/uL — ABNORMAL HIGH (ref 1.7–7.7)
Neutrophils Relative %: 66 %
Platelets: 473 10*3/uL — ABNORMAL HIGH (ref 150–400)
RBC: 4.77 MIL/uL (ref 4.22–5.81)
RDW: 12.7 % (ref 11.5–15.5)
WBC: 13.9 10*3/uL — ABNORMAL HIGH (ref 4.0–10.5)
nRBC: 0 % (ref 0.0–0.2)

## 2021-10-13 LAB — ETHANOL: Alcohol, Ethyl (B): 305 mg/dL (ref <10)

## 2021-10-13 LAB — TROPONIN I (HIGH SENSITIVITY): Troponin I (High Sensitivity): 7 ng/L (ref <18)

## 2021-10-13 MED ORDER — CHLORDIAZEPOXIDE HCL 25 MG PO CAPS
25.0000 mg | ORAL_CAPSULE | Freq: Three times a day (TID) | ORAL | 0 refills | Status: DC | PRN
Start: 2021-10-13 — End: 2022-05-21

## 2021-10-13 NOTE — ED Notes (Signed)
First nurse note:  Pt brought in by AMS for ETOH intoxication. EMS states pt pt has been a 5 day drinking bender.   120/98 HR: 100 95%    CBG 195

## 2021-10-13 NOTE — ED Notes (Signed)
Pt denies any complaints and states "I'm just here". Pt ambulatory with steady gait.

## 2021-10-13 NOTE — ED Notes (Signed)
Safe ride home provided by patients son, Jahred Tatar.

## 2021-10-13 NOTE — ED Provider Notes (Signed)
[facility] Provider Note    Event Date/Time   First MD Initiated Contact with Patient 10/13/21 1658     (approximate)   History   Alcohol Intoxication   HPI  Samuel Riley is a 51 y.o. male who presents to the ER for evaluation of episode of unresponsiveness while intoxicated.  Family was called EMS the patient was fully intoxicated acting and belligerent at home.  Reportedly went to the bathroom son heard a loud thud sound.  Patient does have extensive history of alcohol intoxication and abuse.  They report that he was simply intoxicated they found him with shallow respirations and was minimally responsive but was still breathing.  EMS came and found him he was in intoxicated but was spontaneous respirations and initially cooperative.  Patient states that he wants to leave and does not have any interest in detox.  Denies any SI or HI.     Physical Exam   Triage Vital Signs: ED Triage Vitals  Enc Vitals Group     BP 10/13/21 1653 137/89     Pulse Rate 10/13/21 1653 (!) 110     Resp 10/13/21 1653 18     Temp 10/13/21 1653 98.2 F (36.8 C)     Temp Source 10/13/21 1653 Oral     SpO2 10/13/21 1653 98 %     Weight --      Height --      Head Circumference --      Peak Flow --      Pain Score 10/13/21 1657 0     Pain Loc --      Pain Edu? --      Excl. in [place]? --     Most recent vital signs: Vitals:   10/13/21 1653 10/13/21 1820  BP: 137/89 (!) 136/95  Pulse: (!) 110 (!) 123  Resp: 18 16  Temp: 98.2 F (36.8 C)   SpO2: 98% 96%     Constitutional: Alert, intoxicated appearing Eyes: Conjunctivae are normal.  Head: Atraumatic. Nose: No congestion/rhinnorhea. Mouth/Throat: Mucous membranes are moist.   Neck: Painless ROM.  Cardiovascular:   well perfused, no m/g/r Respiratory: Normal respiratory effort.  No retractions.  Gastrointestinal: Soft and nontender.  Musculoskeletal:  no deformity Neurologic:  MAE spontaneously. No gross  focal neurologic deficits are appreciated.  Skin:  Skin is warm, dry and intact. No rash noted. Psychiatric: Mood and affect are normal. Speech and behavior are normal.    ED Results / Procedures / Treatments   Labs (all labs ordered are listed, but only abnormal results are displayed) Labs Reviewed  CBC WITH DIFFERENTIAL/PLATELET - Abnormal; Notable for the following components:      Result Value   WBC 13.9 (*)    Platelets 473 (*)    Neutro Abs 9.1 (*)    Abs Immature Granulocytes 0.12 (*)    All other components within normal limits  COMPREHENSIVE METABOLIC PANEL - Abnormal; Notable for the following components:   Potassium 3.4 (*)    CO2 21 (*)    Glucose, Bld 193 (*)    Calcium 8.0 (*)    All other components within normal limits  ETHANOL - Abnormal; Notable for the following components:   Alcohol, Ethyl (B) 305 (*)    All other components within normal limits  TROPONIN I (HIGH SENSITIVITY)     EKG  ED ECG REPORT I, Merlyn Lot, the attending physician, personally viewed and interpreted this ECG.   Date: 10/13/2021  EKG Time: 17:29  Rate: 120  Rhythm: sinus  Axis: normal  Intervals: normal  ST&T Change: normal intervals, no stemi    RADIOLOGY Please see ED Course for my review and interpretation.  I personally reviewed all radiographic images ordered to evaluate for the above acute complaints and reviewed radiology reports and findings.  These findings were personally discussed with the patient.  Please see medical record for radiology report.    PROCEDURES:  Critical Care performed:   Procedures   MEDICATIONS ORDERED IN ED: Medications - No data to display   IMPRESSION / MDM / [place] / ED COURSE  I reviewed the triage vital signs and the nursing notes.                              Differential diagnosis includes, but is not limited to, substance abuse, intoxication, electrolyte abnormality, dysrhythmia, ACS, SDH, IPH,  CVA  Patient presented to the ER for evaluation of symptoms as described above.  Patient does appear intoxicated but ambulating with steady gait he is requesting discharge upon arrival.  No report of any agitation or violent behavior but after discussion with son patient was agreeable to stay for blood work and further observation make sure that he is currently stable and appropriate for discharge.  CT imaging ordered for the but differential does not show any evidence of acute intracranial abnormality.  Chest x-ray on my review and interpretation does not show any evidence of consolidation or pneumothorax.   Clinical Course as of 10/13/21 1828  Fri Oct 13, 2021  1824 Patient again requesting discharge.  He is well-perfused.  He is ambulating with a steady gait.  His son is here who agrees to take patient safely home.  He is interested in getting sober therefore will give prescription for detox.  He does not have any history of complicated alcohol withdrawal.  Denies any SI or HI.  Think this episode of unresponsiveness was secondary to significant alcohol use.  Doubt cardiogenic or CNS etiology.  Had long conversation with patient regarding need for him to obtain sobriety.  Forde Dandy was witnessed by son and staff. [PR]    Clinical Course User Index [PR] Merlyn Lot, MD     FINAL CLINICAL IMPRESSION(S) / ED DIAGNOSES   Final diagnoses:  Alcohol dependence with unspecified alcohol-induced disorder ([place])     Rx / DC Orders   ED Discharge Orders          Ordered    chlordiazePOXIDE (LIBRIUM) 25 MG capsule  3 times daily PRN        10/13/21 1823             Note:  This document was prepared using Dragon voice recognition software and may include unintentional dictation errors.    Merlyn Lot, MD 10/13/21 [place] Ee

## 2021-10-13 NOTE — ED Notes (Signed)
Patients son at bedside; EDP, Quentin Cornwall at bedside to discuss plan of care with patient and family.

## 2021-10-24 ENCOUNTER — Other Ambulatory Visit: Payer: Self-pay | Admitting: Internal Medicine

## 2021-10-24 NOTE — Telephone Encounter (Signed)
Refilled: 09/05/2021 Last OV: 08/08/2021 Next OV: not scheduled

## 2021-10-28 ENCOUNTER — Other Ambulatory Visit: Payer: Self-pay | Admitting: Internal Medicine

## 2021-12-21 ENCOUNTER — Other Ambulatory Visit: Payer: Self-pay | Admitting: Internal Medicine

## 2021-12-21 DIAGNOSIS — F1010 Alcohol abuse, uncomplicated: Secondary | ICD-10-CM

## 2021-12-21 MED ORDER — NALTREXONE HCL 50 MG PO TABS: 50.0000 mg | ORAL_TABLET | Freq: Every day | ORAL | 5 refills | Status: DC

## 2021-12-21 NOTE — Assessment & Plan Note (Signed)
Now managed with naltrexone since his last inpatient rehab .  Refilling today

## 2021-12-27 ENCOUNTER — Other Ambulatory Visit: Payer: Self-pay | Admitting: Internal Medicine

## 2022-02-06 NOTE — Telephone Encounter (Signed)
MyChart messgae sent to patient. 

## 2022-02-06 NOTE — Telephone Encounter (Signed)
Error

## 2022-02-09 ENCOUNTER — Emergency Department
Admission: EM | Admit: 2022-02-09 | Discharge: 2022-02-09 | Disposition: A | Discharge status: Home / Self Care | Payer: PRIVATE HEALTH INSURANCE | Attending: Emergency Medicine | Admitting: Emergency Medicine

## 2022-02-09 DIAGNOSIS — F1010 Alcohol abuse, uncomplicated: Secondary | ICD-10-CM | POA: Diagnosis not present

## 2022-02-09 DIAGNOSIS — I10 Essential (primary) hypertension: Secondary | ICD-10-CM | POA: Insufficient documentation

## 2022-02-09 DIAGNOSIS — F10120 Alcohol abuse with intoxication, uncomplicated: Secondary | ICD-10-CM | POA: Insufficient documentation

## 2022-02-09 DIAGNOSIS — Z85828 Personal history of other malignant neoplasm of skin: Secondary | ICD-10-CM | POA: Insufficient documentation

## 2022-02-09 DIAGNOSIS — Y908 Blood alcohol level of 240 mg/100 ml or more: Secondary | ICD-10-CM | POA: Insufficient documentation

## 2022-02-09 DIAGNOSIS — F10920 Alcohol use, unspecified with intoxication, uncomplicated: Secondary | ICD-10-CM

## 2022-02-09 DIAGNOSIS — F29 Unspecified psychosis not due to a substance or known physiological condition: Secondary | ICD-10-CM | POA: Insufficient documentation

## 2022-02-09 DIAGNOSIS — Z20822 Contact with and (suspected) exposure to covid-19: Secondary | ICD-10-CM | POA: Insufficient documentation

## 2022-02-09 LAB — COMPREHENSIVE METABOLIC PANEL
ALT: 30 U/L (ref 0–44)
AST: 49 U/L — ABNORMAL HIGH (ref 15–41)
Albumin: 3.5 g/dL (ref 3.5–5.0)
Alkaline Phosphatase: 96 U/L (ref 38–126)
Anion gap: 18 — ABNORMAL HIGH (ref 5–15)
BUN: 18 mg/dL (ref 6–20)
CO2: 17 mmol/L — ABNORMAL LOW (ref 22–32)
Calcium: 7.7 mg/dL — ABNORMAL LOW (ref 8.9–10.3)
Chloride: 96 mmol/L — ABNORMAL LOW (ref 98–111)
Creatinine, Ser: 0.77 mg/dL (ref 0.61–1.24)
GFR, Estimated: >60 mL/min (ref >60)
Glucose, Bld: 111 mg/dL — ABNORMAL HIGH (ref 70–99)
Potassium: 4 mmol/L (ref 3.5–5.1)
Sodium: 131 mmol/L — ABNORMAL LOW (ref 135–145)
Total Bilirubin: 1.6 mg/dL — ABNORMAL HIGH (ref 0.3–1.2)
Total Protein: 6.6 g/dL (ref 6.5–8.1)

## 2022-02-09 LAB — CBC WITH DIFFERENTIAL/PLATELET
Abs Immature Granulocytes: 0.12 10*3/uL — ABNORMAL HIGH (ref 0.00–0.07)
Basophils Absolute: 0.1 10*3/uL (ref 0.0–0.1)
Basophils Relative: 0 %
Eosinophils Absolute: 0 10*3/uL (ref 0.0–0.5)
Eosinophils Relative: 0 %
HCT: 46.2 % (ref 39.0–52.0)
Hemoglobin: 16.7 g/dL (ref 13.0–17.0)
Immature Granulocytes: 1 %
Lymphocytes Relative: 13 %
Lymphs Abs: 1.8 10*3/uL (ref 0.7–4.0)
MCH: 34.1 pg — ABNORMAL HIGH (ref 26.0–34.0)
MCHC: 36.1 g/dL — ABNORMAL HIGH (ref 30.0–36.0)
MCV: 94.3 fL (ref 80.0–100.0)
Monocytes Absolute: 0.5 10*3/uL (ref 0.1–1.0)
Monocytes Relative: 4 %
Neutro Abs: 10.8 10*3/uL — ABNORMAL HIGH (ref 1.7–7.7)
Neutrophils Relative %: 82 %
Platelets: 358 10*3/uL (ref 150–400)
RBC: 4.9 MIL/uL (ref 4.22–5.81)
RDW: 13.6 % (ref 11.5–15.5)
WBC: 13.3 10*3/uL — ABNORMAL HIGH (ref 4.0–10.5)
nRBC: 0 % (ref 0.0–0.2)

## 2022-02-09 LAB — RESP PANEL BY RT-PCR (RSV, FLU A&B, COVID)  RVPGX2
Influenza A by PCR: NEGATIVE
Influenza B by PCR: NEGATIVE
Resp Syncytial Virus by PCR: NEGATIVE
SARS Coronavirus 2 by RT PCR: NEGATIVE

## 2022-02-09 LAB — SALICYLATE LEVEL: Salicylate Lvl: <7 mg/dL — ABNORMAL LOW (ref 7.0–30.0)

## 2022-02-09 LAB — ETHANOL: Alcohol, Ethyl (B): 279 mg/dL — ABNORMAL HIGH (ref <10)

## 2022-02-09 LAB — CBG MONITORING, ED: Glucose-Capillary: 114 mg/dL — ABNORMAL HIGH (ref 70–99)

## 2022-02-09 LAB — ACETAMINOPHEN LEVEL: Acetaminophen (Tylenol), Serum: <10 ug/mL — ABNORMAL LOW (ref 10–30)

## 2022-02-09 MED ORDER — ONDANSETRON HCL 4 MG/2ML IJ SOLN: 4.0000 mg | Freq: Once | INTRAMUSCULAR | Status: DC

## 2022-02-09 MED ORDER — THIAMINE HCL 100 MG/ML IJ SOLN: 100.0000 mg | Freq: Once | INTRAMUSCULAR | Status: DC

## 2022-02-09 MED ORDER — NICOTINE 14 MG/24HR TD PT24
14.0000 mg | MEDICATED_PATCH | Freq: Once | TRANSDERMAL | Status: DC
  Administered 2022-02-09: 14 mg via TRANSDERMAL
  Filled 2022-02-09: qty 1

## 2022-02-09 MED ORDER — THIAMINE HCL 100 MG PO TABS
100.0000 mg | ORAL_TABLET | Freq: Once | ORAL | Status: AC
  Administered 2022-02-09: 100 mg via ORAL
  Filled 2022-02-09 (×2): qty 1

## 2022-02-09 MED ORDER — DEXTROSE 5 % AND 0.9 % NACL IV BOLUS
1000.0000 mL | Freq: Once | INTRAVENOUS | Status: DC
  Filled 2022-02-09: qty 1000

## 2022-02-09 MED ORDER — FOLIC ACID 1 MG PO TABS
1.0000 mg | ORAL_TABLET | Freq: Once | ORAL | Status: AC
  Administered 2022-02-09: 1 mg via ORAL
  Filled 2022-02-09: qty 1

## 2022-02-09 MED ORDER — LORAZEPAM 2 MG PO TABS
2.0000 mg | ORAL_TABLET | Freq: Once | ORAL | Status: AC
  Administered 2022-02-09: 2 mg via ORAL
  Filled 2022-02-09: qty 1

## 2022-02-09 MED ORDER — ONDANSETRON HCL 4 MG/2ML IJ SOLN
INTRAMUSCULAR | Status: AC
  Administered 2022-02-09: 4 mg via INTRAVENOUS
  Filled 2022-02-09: qty 2

## 2022-02-09 NOTE — ED Notes (Signed)
Pt belongings Blue jeans Black jacket  white tshirt Health visitor [employer]

## 2022-02-09 NOTE — Consult Note (Signed)
[facility] Psychiatry Consult   Reason for Consult: Consult for 51 year old man brought to the emergency room initially under voluntary presentation brought in by family because of intoxication Referring Physician: Starleen Blue Patient Identification: Samuel Riley MRN:  [id number] Principal Diagnosis: Alcohol abuse Diagnosis:  Principal Problem:   Alcohol abuse   Total Time spent with patient: 45 minutes  Subjective:   Samuel Riley is a 51 y.o. male patient admitted with "I ruined my life".  HPI: Patient seen and chart reviewed.  51 year old man brought in to the emergency room.  Patient grossly appears intoxicated and labs which have come back so far show a blood alcohol level of 279.  Patient states that he cannot specifically remember how he came to the emergency room but he assumes that his brother brought him over here.  Notes in the chart from intake state that the family brought him over here and said that they had found him fallen down in the home having soiled himself.  Patient says he knows that he has been drinking.  He cannot give [place] even a rough estimate of how much he has been drinking.  He denies that he has been using any other drugs recently.  Patient very clearly states several times that he has no suicidal thought and no wish to harm himself.  Denies homicidal ideation.  Denies current hallucinations.  Affect seems dysphoric but mostly intoxicated and tired.  Clearly having some confusion presumably from alcohol abuse.  So far has been cooperative with treatment.  He did say to [place] that what he would most like would be "to walk home".  When told that that did not seem practical at the moment he resigned to going back to sleep.  Past Psychiatric History: Patient has had multiple visits to the hospital all for alcohol abuse.  Back in the early summer he was seen by the psychiatric team and referred for detox at [facility].  Apparently has had rehab stays and detoxes at  multiple other facilities.  Old notes suggest that he has had a longstanding alcohol problem with intermittent periods of sobriety.  No report of any past suicidality.  No known alcohol withdrawal seizures.  I cannot find any report that describes delirium tremens.  I asked the patient whether he had ever had delirium tremens and briefly described how it was different from regular withdrawal.  Patient nodded yes but could not provide any other information at this point.  Risk to Self:   Risk to Others:   Prior Inpatient Therapy:   Prior Outpatient Therapy:    Past Medical History:  Past Medical History:  Diagnosis Date   Alcohol abuse    Anxiety    Atypical mole 02/10/2015   Right supra auricular scalp. Mild atypia, deep margin involved.    Basal cell carcinoma 03/14/2006   Mid vertex scalp.    Basal cell carcinoma 03/26/2007   Vertex scalp.    Basal cell carcinoma 01/11/2010   Scalp.    Basal cell carcinoma 11/05/2013   Left anterior deltoid. Superficial. EDC.   Basal cell carcinoma 10/20/2014   Left superior pectoral. Nodular.   Dyslipidemia    Dysplastic nevus 03/06/2017   Right proximal ant. lat. thigh. Mild atypia, limited margins free.   Dysplastic nevus 03/06/2017   Right mid ant. thigh. Mild atypia, limited margins free.   Dysplastic nevus 03/24/2019   Left low back paraspinal. Moderate atypia, close to margin.   Monocytosis     Past Surgical History:  Procedure Laterality Date   COLONOSCOPY WITH PROPOFOL N/A 03/17/2021   Procedure: COLONOSCOPY WITH PROPOFOL;  Surgeon: Jonathon Bellows, MD;  Location: [facility] ENDOSCOPY;  Service: Gastroenterology;  Laterality: N/A;   SPLENECTOMY, TOTAL N/A 2000   [facility]   Family History:  Family History  Problem Relation Age of Onset   Hypertension Father    Prostate cancer Father    Hypertension Brother    Family Psychiatric  History: None reported Social History:  Social History   Substance and Sexual Activity  Alcohol Use Yes    Comment: Fifth Per Day     Social History   Substance and Sexual Activity  Drug Use Not Currently   Types: Cocaine    Social History   Socioeconomic History   Marital status: Divorced    Spouse name: Not on file   Number of children: 1   Years of education: Not on file   Highest education level: Not on file  Occupational History   Occupation: Armed forces operational officer  Tobacco Use   Smoking status: Every Day    Packs/day: 0.50    Years: 30.00    Total pack years: 15.00    Types: Cigarettes   Smokeless tobacco: Never  Vaping Use   Vaping Use: Never used  Substance and Sexual Activity   Alcohol use: Yes    Comment: Fifth Per Day   Drug use: Not Currently    Types: Cocaine   Sexual activity: Yes    Partners: Female    Birth control/protection: Condom  Other Topics Concern   Not on file  Social History Narrative   Not on file   Social Determinants of Health   Financial Resource Strain: Not on file  Food Insecurity: Not on file  Transportation Needs: Not on file  Physical Activity: Not on file  Stress: Not on file  Social Connections: Not on file   Additional Social History:    Allergies:  No Known Allergies  Labs:  Results for orders placed or performed during the hospital encounter of 02/09/22 (from the past 48 hour(s))  POC CBG, ED     Status: Abnormal   Collection Time: 02/09/22  9:19 AM  Result Value Ref Range   Glucose-Capillary 114 (H) 70 - 99 mg/dL    Comment: Glucose reference range applies only to samples taken after fasting for at least 8 hours.  Acetaminophen level     Status: Abnormal   Collection Time: 02/09/22 10:10 AM  Result Value Ref Range   Acetaminophen (Tylenol), Serum <10 (L) 10 - 30 ug/mL    Comment: (NOTE) Therapeutic concentrations vary significantly. A range of 10-30 ug/mL  may be an effective concentration for many patients. However, some  are best treated at concentrations outside of this range. Acetaminophen concentrations >150 ug/mL  at 4 hours after ingestion  and >50 ug/mL at 12 hours after ingestion are often associated with  toxic reactions.  Performed at [facility], [address] [place], [place] [address]   Salicylate level     Status: Abnormal   Collection Time: 02/09/22 10:10 AM  Result Value Ref Range   Salicylate Lvl <5.7 (L) 7.0 - 30.0 mg/dL    Comment: Performed at [facility], [address] [place], [place] [address]  CBC with Differential     Status: Abnormal   Collection Time: 02/09/22 10:10 AM  Result Value Ref Range   WBC 13.3 (H) 4.0 - 10.5 K/uL   RBC 4.90 4.22 - 5.81 MIL/uL  Hemoglobin 16.7 13.0 - 17.0 g/dL   HCT 46.2 39.0 - 52.0 %   MCV 94.3 80.0 - 100.0 fL   MCH 34.1 (H) 26.0 - 34.0 pg   MCHC 36.1 (H) 30.0 - 36.0 g/dL   RDW 13.6 11.5 - 15.5 %   Platelets 358 150 - 400 K/uL   nRBC 0.0 0.0 - 0.2 %   Neutrophils Relative % 82 %   Neutro Abs 10.8 (H) 1.7 - 7.7 K/uL   Lymphocytes Relative 13 %   Lymphs Abs 1.8 0.7 - 4.0 K/uL   Monocytes Relative 4 %   Monocytes Absolute 0.5 0.1 - 1.0 K/uL   Eosinophils Relative 0 %   Eosinophils Absolute 0.0 0.0 - 0.5 K/uL   Basophils Relative 0 %   Basophils Absolute 0.1 0.0 - 0.1 K/uL   Immature Granulocytes 1 %   Abs Immature Granulocytes 0.12 (H) 0.00 - 0.07 K/uL    Comment: Performed at [facility], [address] [place], [place] [address]  Ethanol     Status: Abnormal   Collection Time: 02/09/22 10:10 AM  Result Value Ref Range   Alcohol, Ethyl (B) 279 (H) <10 mg/dL    Comment: (NOTE) Lowest detectable limit for serum alcohol is 10 mg/dL.  For medical purposes only. Performed at [facility], [address] [place], [place] [address]   Comprehensive metabolic panel     Status: Abnormal   Collection Time: 02/09/22 10:10 AM  Result Value Ref Range   Sodium 131 (L) 135 - 145 mmol/L   Potassium 4.0 3.5 - 5.1 mmol/L    Comment: HEMOLYSIS AT THIS LEVEL MAY AFFECT RESULT   Chloride 96 (L) 98  - 111 mmol/L   CO2 17 (L) 22 - 32 mmol/L   Glucose, Bld 111 (H) 70 - 99 mg/dL    Comment: Glucose reference range applies only to samples taken after fasting for at least 8 hours.   BUN 18 6 - 20 mg/dL   Creatinine, Ser 0.77 0.61 - 1.24 mg/dL   Calcium 7.7 (L) 8.9 - 10.3 mg/dL   Total Protein 6.6 6.5 - 8.1 g/dL   Albumin 3.5 3.5 - 5.0 g/dL   AST 49 (H) 15 - 41 U/L    Comment: HEMOLYSIS AT THIS LEVEL MAY AFFECT RESULT   ALT 30 0 - 44 U/L    Comment: HEMOLYSIS AT THIS LEVEL MAY AFFECT RESULT   Alkaline Phosphatase 96 38 - 126 U/L   Total Bilirubin 1.6 (H) 0.3 - 1.2 mg/dL    Comment: HEMOLYSIS AT THIS LEVEL MAY AFFECT RESULT   GFR, Estimated >60 >60 mL/min    Comment: (NOTE) Calculated using the CKD-EPI Creatinine Equation (2021)    Anion gap 18 (H) 5 - 15    Comment: Performed at [facility], [address] [place], [place] [address]  Resp panel by RT-PCR (RSV, Flu A&B, Covid) Anterior Nasal Swab     Status: None   Collection Time: 02/09/22 10:10 AM   Specimen: Anterior Nasal Swab  Result Value Ref Range   SARS Coronavirus 2 by RT PCR NEGATIVE NEGATIVE   Influenza A by PCR NEGATIVE NEGATIVE   Influenza B by PCR NEGATIVE NEGATIVE   Resp Syncytial Virus by PCR NEGATIVE NEGATIVE    Comment: Performed at [facility], [address] [place], [place] [address]    Current Facility-Administered Medications  Medication Dose Route Frequency Provider Last Rate Last Admin   lidocaine (XYLOCAINE) 2 % (with  pres) injection 15 mg  0.75 mL Other Once Crecencio Mc, MD       Current Outpatient Medications  Medication Sig Dispense Refill   ALPRAZolam (XANAX XR) 0.5 MG 24 hr tablet Take 1 tablet (0.5 mg total) by mouth daily as needed for anxiety. 30 tablet 2   amLODipine (NORVASC) 10 MG tablet TAKE 1 TABLET BY MOUTH EVERY DAY 30 tablet 5   atomoxetine (STRATTERA) 10 MG capsule 2 CAPSULES IN THE AM, 1 IN THE PM 270 capsule 0   Calcipotriene-Betameth Diprop (ENSTILAR)  0.005-0.064 % FOAM Apply 1 application topically daily. 60 g 2   celecoxib (CELEBREX) 200 MG capsule TAKE 1 CAPSULE (200 MG TOTAL) BY MOUTH DAILY AFTER BREAKFAST. 30 capsule 3   chlordiazePOXIDE (LIBRIUM) 25 MG capsule Take 1 capsule (25 mg total) by mouth 3 (three) times daily as needed for anxiety. 25 mg 3 times a day x 3 days 25 mg 2 times a day for 3 days 25 mg once a day for 3 days stop Total of 18 pills 18 capsule 0   cloNIDine (CATAPRES) 0.1 MG tablet TAKE 1 TABLET (0.1 MG TOTAL) BY MOUTH AT BEDTIME AS NEEDED. 90 tablet 0   disulfiram (ANTABUSE) 250 MG tablet TAKE 1 TABLET BY MOUTH EVERY DAY 90 tablet 1   finasteride (PROPECIA) 1 MG tablet Take 1 mg by mouth daily.     naltrexone (DEPADE) 50 MG tablet Take 1 tablet (50 mg total) by mouth daily. 30 tablet 5   omeprazole (PRILOSEC) 20 MG capsule Take 20 mg by mouth daily.     RA MELATONIN 10 MG TABS Take 1 tablet by mouth at bedtime.     sertraline (ZOLOFT) 50 MG tablet TAKE 1 TABLET BY MOUTH EVERY DAY 90 tablet 4   Sodium Sulfate-Mag Sulfate-KCl (SUTAB) [phone] MG TABS Take 1 kit by mouth as directed. 24 tablet 0    Musculoskeletal: Strength & Muscle Tone: within normal limits Gait & Station: normal Patient leans: N/A            Psychiatric Specialty Exam:  Presentation  General Appearance:  Appropriate for Environment  Eye Contact: Good  Speech: Clear and Coherent  Speech Volume: Normal  Handedness: Right   Mood and Affect  Mood: Depressed  Affect: Blunt; Depressed   Thought Process  Thought Processes: Irrevelant  Descriptions of Associations:Loose  Orientation:Full (Time, Place and Person)  Thought Content:Scattered  History of Schizophrenia/Schizoaffective disorder:No  Duration of Psychotic Symptoms:No data recorded Hallucinations:No data recorded Ideas of Reference:None  Suicidal Thoughts:No data recorded Homicidal Thoughts:No data recorded  Sensorium  Memory: Immediate Fair;  Recent Fair; Remote Fair  Judgment: Poor  Insight: Poor   Executive Functions  Concentration: Fair  Attention Span: Fair  Recall: [employer] of Knowledge: Fair  Language: Fair   Psychomotor Activity  Psychomotor Activity:No data recorded  Assets  Assets: Communication Skills; Desire for Improvement   Sleep  Sleep:No data recorded  Physical Exam: Physical Exam Vitals and nursing note reviewed.  Constitutional:      Appearance: Normal appearance.  HENT:     Head: Normocephalic and atraumatic.     Mouth/Throat:     Pharynx: Oropharynx is clear.  Eyes:     Pupils: Pupils are equal, round, and reactive to light.  Cardiovascular:     Rate and Rhythm: Normal rate and regular rhythm.  Pulmonary:     Effort: Pulmonary effort is normal.     Breath sounds: Normal breath sounds.  Abdominal:  General: Abdomen is flat.     Palpations: Abdomen is soft.  Musculoskeletal:        General: Normal range of motion.  Skin:    General: Skin is warm and dry.  Neurological:     General: No focal deficit present.     Mental Status: He is alert. Mental status is at baseline.  Psychiatric:        Attention and Perception: He is inattentive.        Mood and Affect: Affect is blunt.        Speech: Speech is delayed.        Behavior: Behavior is slowed.        Thought Content: Thought content does not include homicidal or suicidal ideation.        Cognition and Memory: Memory is impaired.    Review of Systems  Constitutional: Negative.   HENT: Negative.    Eyes: Negative.   Respiratory: Negative.    Cardiovascular: Negative.   Gastrointestinal: Negative.   Musculoskeletal: Negative.   Skin: Negative.   Neurological: Negative.   Psychiatric/Behavioral:  Positive for memory loss and substance abuse. Negative for depression, hallucinations and suicidal ideas. The patient is not nervous/anxious and does not have insomnia.    Blood pressure (!) 152/93, pulse 100,  temperature 98.2 F (36.8 C), temperature source Oral, resp. rate 18, height _0  (1.727 m), weight 72.6 kg, SpO2 100 %. Body mass index is 24.33 kg/m.  Treatment Plan Summary: Plan 51 year old man with primary problem of alcohol dependence and intoxication.  Does not meet criteria for psychiatric admission.  I would not recommend initiating IVC at this point although that is up to the discretion of the emergency room physician as far as practical management.  Patient could potentially benefit from detox and substance abuse treatment.  I would recommend when he sobers up a little bit asking him if he wants [place] to refer him to rehab.  We could see whether [facility] would take him or whether [facility] or another facility would still be available.  If he does not want admission and is not showing any more acutely dangerous behavior than once he sobers up a bit I think it would be reasonable to discharge him with outpatient resources.  Disposition: No evidence of imminent risk to self or others at present.   Patient does not meet criteria for psychiatric inpatient admission.  Alethia Berthold, MD 02/09/2022 11:26 AM

## 2022-02-09 NOTE — ED Notes (Signed)
Pt in bed, meal tray given, meds given, pt calm and cooperative.

## 2022-02-09 NOTE — BH Assessment (Addendum)
Comprehensive Clinical Assessment (CCA) Screening, Triage and Referral Note  02/09/2022 Samuel Riley [id number]  Chief Complaint:  Chief Complaint  Patient presents with   Alcohol Intoxication   Visit Diagnosis: Alcohol Use Disorder  Samuel Riley is a 51 year old male who presents to the ER, after he was found by his brother intoxicated. Patient admits to the use of alcohol and cocaine. Upon arrival to the ER his BAC was 279. Patient denies SI/HI and AV/H.  Patient Reported Information How did you hear about [place]? Self  What Is the Reason for Your Visit/Call Today? Patient voiced SI, while intoxicated.  How Long Has This Been Causing You Problems? 1 wk - 1 month  What Do You Feel Would Help You the Most Today? Treatment for Depression or other mood problem   Have You Recently Had Any Thoughts About Hurting Yourself? No  Are You Planning to Commit Suicide/Harm Yourself At This time? No   Have you Recently Had Thoughts About [place]? No  Are You Planning to Harm Someone at This Time? No  Explanation: No data recorded  Have You Used Any Alcohol or Drugs in the Past 24 Hours? No  How Long Ago Did You Use Drugs or Alcohol? No data recorded What Did You Use and How Much? Alcohol "a 5th of vodka"   Do You Currently Have a Therapist/Psychiatrist? No  Name of Therapist/Psychiatrist: No data recorded  Have You Been Recently Discharged From Any Office Practice or Programs? No  Explanation of Discharge From Practice/Program: No data recorded   CCA Screening Triage Referral Assessment Type of Contact: Face-to-Face  Telemedicine Service Delivery:   Is this Initial or Reassessment?   Date Telepsych consult ordered in [facility]:    Time Telepsych consult ordered in [facility]:    Location of Assessment: [facility] ED  Provider Location: [facility] ED    Collateral Involvement: No data recorded  Does Patient Have a Lookout? No data recorded Name and  Contact of Legal Guardian: No data recorded If Minor and Not Living with Parent(s), Who has Custody? No data recorded Is CPS involved or ever been involved? Never  Is APS involved or ever been involved? Never   Patient Determined To Be At Risk for Harm To Self or Others Based on Review of Patient Reported Information or Presenting Complaint? No  Method: No data recorded Availability of Means: No data recorded Intent: No data recorded Notification Required: No data recorded Additional Information for Danger to Others Potential: No data recorded Additional Comments for Danger to Others Potential: No data recorded Are There Guns or Other Weapons in Your Home? No data recorded Types of Guns/Weapons: No data recorded Are These Weapons Safely Secured?                            No data recorded Who Could Verify You Are Able To Have These Secured: No data recorded Do You Have any Outstanding Charges, Pending Court Dates, Parole/Probation? No data recorded Contacted To Inform of Risk of Harm To Self or Others: No data recorded  Does Patient Present under Involuntary Commitment? No    County of Residence: [place]   Patient Currently Receiving the Following Services: Not Receiving Services   Determination of Need: Emergent (2 hours)   Options For Referral: ED Visit   Discharge Disposition:    Gunnar Fusi MS, LCAS, [facility], [facility] Therapeutic Triage Specialist 02/09/2022 4:35 PM

## 2022-02-09 NOTE — ED Notes (Signed)
ivc/consult done/pending psych clearance.Marland Kitchen

## 2022-02-09 NOTE — ED Triage Notes (Signed)
Pt presents to the ED via [facility] due to ETOH and cocaine use. Per EMS pt is living with his brother. Pt was heard stumbling around in the home. Pt's brother found him laying in his feces and urine. Upon arrival pt was grunting and responding to some questions. Pt is altered but cooperative.

## 2022-02-09 NOTE — ED Notes (Signed)
Pt is up and mildly agitated, states that he really wants to go home, explained to pt his current plan of care.  Ativan given

## 2022-02-09 NOTE — ED Notes (Signed)
Pt states that he still feels anxious and wants to go home, states that he would like to go outside for a cigarette, offered nicotine patch, md notified.

## 2022-02-09 NOTE — ED Provider Notes (Signed)
[facility] Provider Note    Event Date/Time   First MD Initiated Contact with Patient 02/09/22 [phone]     (approximate)   History   Alcohol Intoxication   HPI  Samuel Riley is a 51 y.o. male past medical history of alcohol use disorder presents with altered mental status.  Per EMS patient was found by his brother covered in urine and feces.  Thinks he had been drinking maybe has used cocaine.  Patient's had multiple visits for alcohol intoxication alcohol use disorder in the past.   Patient does admit to drinking last night but denies drinking today.  He denies any active drug use.  Tells me he has no pain no injury.  I see that patient was in outside hospital ED with blood alcohol of 380 yesterday.     Past Medical History:  Diagnosis Date   Alcohol abuse    Anxiety    Atypical mole 02/10/2015   Right supra auricular scalp. Mild atypia, deep margin involved.    Basal cell carcinoma 03/14/2006   Mid vertex scalp.    Basal cell carcinoma 03/26/2007   Vertex scalp.    Basal cell carcinoma 01/11/2010   Scalp.    Basal cell carcinoma 11/05/2013   Left anterior deltoid. Superficial. EDC.   Basal cell carcinoma 10/20/2014   Left superior pectoral. Nodular.   Dyslipidemia    Dysplastic nevus 03/06/2017   Right proximal ant. lat. thigh. Mild atypia, limited margins free.   Dysplastic nevus 03/06/2017   Right mid ant. thigh. Mild atypia, limited margins free.   Dysplastic nevus 03/24/2019   Left low back paraspinal. Moderate atypia, close to margin.   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   Encounter for preventive health examination 05/22/2016   Screen for STD (sexually transmitted disease) 05/22/2016   Encounter for  screening for lung cancer 05/22/2016   B12 deficiency 05/21/2016   Midline thoracic back pain 06/29/2015   Tobacco abuse 05/19/2015   Hyperactivity disorder, predominantly hyperactive-impulsive type 05/19/2015   Personal history of traumatic brain injury 05/19/2015   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 --      Excl. in [place]? --     Most recent vital signs: Vitals:   02/09/22 0730 02/09/22 0735  BP:  (!) 143/86  Pulse:  96  Resp:  18  Temp:  98.2 F (36.8 C)  SpO2: 97% 100%     General: Awake, no distress.  CV:  Good peripheral perfusion.  Resp:  Normal effort.  Abd:  No distention.  Nontender Neuro:             Awake, Alert, Oriented x 3  Other:  Patient intermittently groaning but is awake and alert and follows commands Appears intoxicated, smells of alcohol   ED Results / Procedures / Treatments  Labs (all labs ordered are listed, but only abnormal results are displayed) Labs Reviewed  CBG MONITORING, ED     EKG     RADIOLOGY    PROCEDURES:  Critical Care performed: No  Procedures     MEDICATIONS ORDERED IN ED: Medications - No data to display    IMPRESSION / MDM / [employer] / ED COURSE  I reviewed the triage vital signs and the nursing notes.                              Patient's presentation is most consistent with exacerbation of chronic illness.  Differential diagnosis includes, but is not limited to, alcohol intoxication, withdrawal, cocaine or  other amphetamine intoxication less likely intracranial hemorrhage metabolic abnormality  Patient is a 51 year old male with multiple visits for alcohol intoxication alcohol use disorder presents because of altered mental status.  Patient admits to drinking alcohol last night.  Apparently his brother found him covered in urine and feces.  He is groaning in hallway stretcher but is able to provide some history and is alert and oriented denying pain he has a benign abdomen.  Reviewed recent ED visit from outside hospital ED just yesterday patient had blood EtOH level of 380.  On exam he has no signs of trauma and given he is alert I do not feel need to image his head at this time.  Suspect that this is alcohol intoxication and potentially polysubstance use.  Will observe until he is clinically sober.  If not progressing as I would expect then we will pursue additional workup.  Spoke with patient's brother Lennette Bihari who tells me that patient has been binge drinking.  On Monday patient brother picked him up and he said that he was ready to eat a bullet, and his girlfriend said that he had a pistol sitting on a Bible next to his bed.  Patient is currently denying any suicidality to me however given his alcohol use disorder and access to firearms I think that he is at high risk for completing suicide.  Will place under IVC as patient's brother already has plans to do so if we discharged the patient.  Will add on labs and consult psychiatry. Clinical Course as of 02/09/22 1003  Fri Feb 09, 2022  0921 Glucose-Capillary(!): 114 [KM]    Clinical Course User Index [KM] Rada Hay, MD     FINAL CLINICAL IMPRESSION(S) / ED DIAGNOSES   Final diagnoses:  Alcoholic intoxication without complication ([place])     Rx / DC Orders   ED Discharge Orders     None        Note:  This document was prepared using Dragon voice recognition software and may include unintentional dictation errors.   Rada Hay, MD 02/09/22 1004

## 2022-02-09 NOTE — ED Notes (Signed)
Pt sitting up in bed, pt denies pain, pt states that he is ready to go home, pt denies si or hi, pt thankful for the care received, pt verbalized understanding d/c and follow up, advised to return for any concerns or worsening symptoms. Belongings returned to pt, states that he will call his brother to come get him, pt ambulatory from dpt.

## 2022-02-09 NOTE — ED Notes (Signed)
Pt in bed, pt states that he is a "drunk" pt states that he does not have any thoughts of hurting himself or other, pt denies si or hi, denies depression. Psyc MD at bedside, pt calm and cooperative, pt answering questions appropriately.

## 2022-02-13 ENCOUNTER — Telehealth: Payer: Self-pay

## 2022-02-13 ENCOUNTER — Other Ambulatory Visit: Payer: Self-pay | Admitting: Internal Medicine

## 2022-02-13 NOTE — Telephone Encounter (Signed)
Last fill was 10/24/21 and last OV was 08/08/2021 okay to fill.

## 2022-02-13 NOTE — Telephone Encounter (Signed)
[facility] called stating that pt walked into recovery center today "highly intoxicated" with a bag of medications but was unable to tell them if he was taking them or what they were for. Nurse from facility asked if we could go over his medication list with her. I spoke with Dr. Derrel Nip and she gave me the verbal to go over it with her based off of pt's last office note which was 08/08/2021. The list that was given was:  -Finasteride 1 mg tablet take one by mouth daily -Omeprazole 20 mg capsule take one by mouth before breakfast -Melatonin 10 mg 1 tablet at bedtime -Strattera 10 mg Take 20 mg in the morning and 10 mg in the evening(per new directions in last office note) -Clonidine 0.1 mg tablet Take 1 tablet PRN -Alprazolam 0.5 mg tablet Take 1 tablet daily as needed -Celebrex 200 mg capsule Take 1 capsule daily. -Sertraline 25 mg daily x 4 days, then 50 mg daily  -Amlodipine 10 mg Take 1 tablet daily.   She also asked if there was any surgical history to explain why the pt had a scar on his abdomen. Per pt's chart she was advised that pt had a Splenectomy in 2000.  She then stated that she thinks that is all she needs for now to get him checked in but that they would call if they needed anything else.

## 2022-02-15 ENCOUNTER — Ambulatory Visit: Payer: PRIVATE HEALTH INSURANCE | Admitting: Dermatology

## 2022-03-28 ENCOUNTER — Other Ambulatory Visit: Payer: Self-pay | Admitting: Internal Medicine

## 2022-05-21 ENCOUNTER — Other Ambulatory Visit: Payer: Self-pay | Admitting: Internal Medicine

## 2022-05-21 DIAGNOSIS — Z5181 Encounter for therapeutic drug level monitoring: Secondary | ICD-10-CM

## 2022-05-22 ENCOUNTER — Other Ambulatory Visit (INDEPENDENT_AMBULATORY_CARE_PROVIDER_SITE_OTHER): Payer: Self-pay

## 2022-05-22 DIAGNOSIS — Z5181 Encounter for therapeutic drug level monitoring: Secondary | ICD-10-CM

## 2022-05-23 ENCOUNTER — Telehealth: Payer: Self-pay

## 2022-05-23 ENCOUNTER — Other Ambulatory Visit: Payer: Self-pay | Admitting: Internal Medicine

## 2022-05-23 LAB — CBC WITH DIFFERENTIAL/PLATELET
Basophils Absolute: 0.1 10*3/uL (ref 0.0–0.1)
Basophils Relative: 1 % (ref 0.0–3.0)
Eosinophils Absolute: 0.2 10*3/uL (ref 0.0–0.7)
Eosinophils Relative: 1.4 % (ref 0.0–5.0)
HCT: 46 % (ref 39.0–52.0)
Hemoglobin: 15.7 g/dL (ref 13.0–17.0)
Lymphocytes Relative: 24.7 % (ref 12.0–46.0)
Lymphs Abs: 2.9 10*3/uL (ref 0.7–4.0)
MCHC: 34.2 g/dL (ref 30.0–36.0)
MCV: 102.1 fl — ABNORMAL HIGH (ref 78.0–100.0)
Monocytes Absolute: 1.1 10*3/uL — ABNORMAL HIGH (ref 0.1–1.0)
Monocytes Relative: 9.2 % (ref 3.0–12.0)
Neutro Abs: 7.4 10*3/uL (ref 1.4–7.7)
Neutrophils Relative %: 63.7 % (ref 43.0–77.0)
Platelets: 393 10*3/uL (ref 150.0–400.0)
RBC: 4.51 Mil/uL (ref 4.22–5.81)
RDW: 13.3 % (ref 11.5–15.5)
WBC: 11.6 10*3/uL — ABNORMAL HIGH (ref 4.0–10.5)

## 2022-05-23 LAB — COMPREHENSIVE METABOLIC PANEL
ALT: 13 U/L (ref 0–53)
AST: 14 U/L (ref 0–37)
Albumin: 4.5 g/dL (ref 3.5–5.2)
Alkaline Phosphatase: 49 U/L (ref 39–117)
BUN: 13 mg/dL (ref 6–23)
CO2: 30 mEq/L (ref 19–32)
Calcium: 9.5 mg/dL (ref 8.4–10.5)
Chloride: 100 mEq/L (ref 96–112)
Creatinine, Ser: 1.27 mg/dL (ref 0.40–1.50)
GFR: 64.72 mL/min (ref >60.00)
Glucose, Bld: 88 mg/dL (ref 70–99)
Potassium: 4.4 mEq/L (ref 3.5–5.1)
Sodium: 137 mEq/L (ref 135–145)
Total Bilirubin: 0.4 mg/dL (ref 0.2–1.2)
Total Protein: 6.9 g/dL (ref 6.0–8.3)

## 2022-05-23 LAB — VALPROIC ACID LEVEL: Valproic Acid Lvl: 20.9 mg/L — ABNORMAL LOW (ref 50.0–100.0)

## 2022-05-23 MED ORDER — DIVALPROEX SODIUM 250 MG PO DR TAB: DELAYED_RELEASE_TABLET | ORAL | 5 refills | Status: DC

## 2022-05-23 NOTE — Telephone Encounter (Signed)
LMTCB in regards to lab results.  

## 2022-05-23 NOTE — Telephone Encounter (Signed)
-----   Message from Crecencio Mc, MD sent at 05/23/2022  3:05 PM EDT ----- Your labs are fine ,  but you may be b12 or folate deficient,   so I would like you to take a multivitamin for men that contains both.    Let me know what dose of depakote you are taking and I will refill it when needed

## 2022-05-24 ENCOUNTER — Telehealth: Payer: Self-pay

## 2022-05-24 NOTE — Telephone Encounter (Signed)
noted 

## 2022-05-24 NOTE — Telephone Encounter (Signed)
Samuel Riley is calling from [facility] to state they received a prescription for Naltrexone 50 MG.  Samuel Riley states patient has switched from [facility] to [facility].  Samuel Riley states this is on long-term back order and they would like to know if Dr. Deborra Medina would like to prescribe a different medication.

## 2022-05-24 NOTE — Telephone Encounter (Signed)
Pt called back and I attempted to read the message to him but he already saw it

## 2022-05-30 NOTE — Telephone Encounter (Signed)
Pt is scheduled for friday 

## 2022-06-01 ENCOUNTER — Encounter: Payer: Self-pay | Admitting: Internal Medicine

## 2022-06-01 ENCOUNTER — Ambulatory Visit: Payer: BC Managed Care – PPO | Admitting: Internal Medicine

## 2022-06-01 VITALS — BP 110/60 | HR 75 | Temp 97.7°F | Ht 68.0 in | Wt 165.0 lb

## 2022-06-01 DIAGNOSIS — M20002 Unspecified deformity of left finger(s): Secondary | ICD-10-CM | POA: Diagnosis not present

## 2022-06-01 DIAGNOSIS — F1010 Alcohol abuse, uncomplicated: Secondary | ICD-10-CM | POA: Diagnosis not present

## 2022-06-01 NOTE — Progress Notes (Unsigned)
Subjective:  Patient ID: Samuel Riley Earnhardt, male    DOB: 1970-07-04  Age: 52 y.o. MRN: [id number]  CC: There were no encounter diagnoses.   HPI Samuel Riley Castell presents for  Chief Complaint  Patient presents with   Medical Management of Chronic Issues    1) Alcohol abuse:  patient has been through several treatment centers since his last visit and is currently taking depakote, clonidine, sertraline and strattera  .  Recent CBC and CMET are reviewed and normal  Outpatient Medications Prior to Visit  Medication Sig Dispense Refill   amLODipine (NORVASC) 10 MG tablet TAKE 1 TABLET BY MOUTH EVERY DAY 30 tablet 5   atomoxetine (STRATTERA) 10 MG capsule 2 CAPSULES IN THE AM, 1 IN THE PM 270 capsule 0   celecoxib (CELEBREX) 200 MG capsule TAKE 1 CAPSULE (200 MG TOTAL) BY MOUTH DAILY AFTER BREAKFAST. 30 capsule 3   cloNIDine (CATAPRES) 0.1 MG tablet TAKE 1 TABLET (0.1 MG TOTAL) BY MOUTH AT BEDTIME AS NEEDED. 90 tablet 0   divalproex (DEPAKOTE) 250 MG DR tablet 3 tablets in the morning,  2 tablets in the evening 150 tablet 5   finasteride (PROPECIA) 1 MG tablet Take 1 mg by mouth daily.     omeprazole (PRILOSEC) 20 MG capsule Take 20 mg by mouth daily.     sertraline (ZOLOFT) 50 MG tablet TAKE 1 TABLET BY MOUTH EVERY DAY (Patient not taking: Reported on 06/01/2022) 90 tablet 4   Facility-Administered Medications Prior to Visit  Medication Dose Route Frequency Provider Last Rate Last Admin   lidocaine (XYLOCAINE) 2 % (with pres) injection 15 mg  0.75 mL Other Once Sherlene Shamsullo, Niquita Digioia L, MD        Review of Systems;  Patient denies headache, fevers, malaise, unintentional weight loss, skin rash, eye pain, sinus congestion and sinus pain, sore throat, dysphagia,  hemoptysis , cough, dyspnea, wheezing, chest pain, palpitations, orthopnea, edema, abdominal pain, nausea, melena, diarrhea, constipation, flank pain, dysuria, hematuria, urinary  Frequency, nocturia, numbness, tingling, seizures,  Focal  weakness, Loss of consciousness,  Tremor, insomnia, depression, anxiety, and suicidal ideation.      Objective:  BP 110/60   Pulse 75   Temp 97.7 F (36.5 C) (Oral)   Ht 5\' 8"  (1.727 m)   Wt 165 lb (74.8 kg)   SpO2 97%   BMI 25.09 kg/m   BP Readings from Last 3 Encounters:  06/01/22 110/60  02/09/22 132/85  10/13/21 (!) 136/95    Wt Readings from Last 3 Encounters:  06/01/22 165 lb (74.8 kg)  02/09/22 160 lb (72.6 kg)  08/24/21 170 lb (77.1 kg)    Physical Exam  Lab Results  Component Value Date   HGBA1C 5.2 06/10/2017   HGBA1C 5.5 05/21/2016    Lab Results  Component Value Date   CREATININE 1.27 05/22/2022   CREATININE 0.77 02/09/2022   CREATININE 0.90 10/13/2021    Lab Results  Component Value Date   WBC 11.6 (H) 05/22/2022   HGB 15.7 05/22/2022   HCT 46.0 05/22/2022   PLT 393.0 05/22/2022   GLUCOSE 88 05/22/2022   CHOL 176 12/28/2020   TRIG 497.0 (H) 12/28/2020   HDL 33.40 (L) 12/28/2020   LDLDIRECT 82.0 12/28/2020   LDLCALC 148 (H) 05/18/2016   ALT 13 05/22/2022   AST 14 05/22/2022   NA 137 05/22/2022   K 4.4 05/22/2022   CL 100 05/22/2022   CREATININE 1.27 05/22/2022   BUN 13 05/22/2022   CO2 30 05/22/2022  TSH 0.95 05/05/2021   HGBA1C 5.2 06/10/2017    No results found.  Assessment & Plan:  .There are no diagnoses linked to this encounter.   I provided 30 minutes of face-to-face time during this encounter reviewing patient's last visit with me, patient's  most recent visit with cardiology,  nephrology,  and neurology,  recent surgical and non surgical procedures, previous  labs and imaging studies, counseling on currently addressed issues,  and post visit ordering to diagnostics and therapeutics .   Follow-up: No follow-ups on file.   Sherlene Shams, MD

## 2022-06-03 NOTE — Assessment & Plan Note (Signed)
He has had several hospitalizations since November  due to frequent relapses .  He ha ssbeen sober for over 2 momths ,  has a sponsor and is substituting ice cream  for his alcohl cravings

## 2022-06-12 DIAGNOSIS — G5603 Carpal tunnel syndrome, bilateral upper limbs: Secondary | ICD-10-CM | POA: Diagnosis not present

## 2022-06-30 ENCOUNTER — Other Ambulatory Visit: Payer: Self-pay | Admitting: Internal Medicine

## 2022-07-02 NOTE — Telephone Encounter (Signed)
Refilled: 03/28/2022 Last OV: 06/01/2022 Next OV: not scheduled

## 2022-07-03 DIAGNOSIS — M720 Palmar fascial fibromatosis [Dupuytren]: Secondary | ICD-10-CM | POA: Diagnosis not present

## 2022-07-12 DIAGNOSIS — G5623 Lesion of ulnar nerve, bilateral upper limbs: Secondary | ICD-10-CM | POA: Diagnosis not present

## 2022-07-12 DIAGNOSIS — G5603 Carpal tunnel syndrome, bilateral upper limbs: Secondary | ICD-10-CM | POA: Diagnosis not present

## 2022-07-31 DIAGNOSIS — G5623 Lesion of ulnar nerve, bilateral upper limbs: Secondary | ICD-10-CM | POA: Diagnosis not present

## 2022-08-02 ENCOUNTER — Other Ambulatory Visit: Payer: Self-pay | Admitting: Family

## 2022-08-28 DIAGNOSIS — F17200 Nicotine dependence, unspecified, uncomplicated: Secondary | ICD-10-CM | POA: Diagnosis not present

## 2022-08-28 DIAGNOSIS — L659 Nonscarring hair loss, unspecified: Secondary | ICD-10-CM | POA: Diagnosis not present

## 2022-08-28 DIAGNOSIS — Z9081 Acquired absence of spleen: Secondary | ICD-10-CM | POA: Diagnosis not present

## 2022-08-28 DIAGNOSIS — F1020 Alcohol dependence, uncomplicated: Secondary | ICD-10-CM | POA: Diagnosis not present

## 2022-08-28 DIAGNOSIS — G4700 Insomnia, unspecified: Secondary | ICD-10-CM | POA: Diagnosis not present

## 2022-08-28 DIAGNOSIS — Z8616 Personal history of COVID-19: Secondary | ICD-10-CM | POA: Diagnosis not present

## 2022-08-28 DIAGNOSIS — Z87820 Personal history of traumatic brain injury: Secondary | ICD-10-CM | POA: Diagnosis not present

## 2022-08-28 DIAGNOSIS — I10 Essential (primary) hypertension: Secondary | ICD-10-CM | POA: Diagnosis not present

## 2022-08-28 DIAGNOSIS — R12 Heartburn: Secondary | ICD-10-CM | POA: Diagnosis not present

## 2022-08-28 DIAGNOSIS — J302 Other seasonal allergic rhinitis: Secondary | ICD-10-CM | POA: Diagnosis not present

## 2022-08-28 DIAGNOSIS — R4587 Impulsiveness: Secondary | ICD-10-CM | POA: Diagnosis not present

## 2022-09-03 DIAGNOSIS — R12 Heartburn: Secondary | ICD-10-CM | POA: Diagnosis not present

## 2022-09-03 DIAGNOSIS — G4700 Insomnia, unspecified: Secondary | ICD-10-CM | POA: Diagnosis not present

## 2022-09-03 DIAGNOSIS — Z87820 Personal history of traumatic brain injury: Secondary | ICD-10-CM | POA: Diagnosis not present

## 2022-09-03 DIAGNOSIS — F411 Generalized anxiety disorder: Secondary | ICD-10-CM | POA: Diagnosis not present

## 2022-09-03 DIAGNOSIS — I10 Essential (primary) hypertension: Secondary | ICD-10-CM | POA: Diagnosis not present

## 2022-09-03 DIAGNOSIS — F17200 Nicotine dependence, unspecified, uncomplicated: Secondary | ICD-10-CM | POA: Diagnosis not present

## 2022-09-03 DIAGNOSIS — L659 Nonscarring hair loss, unspecified: Secondary | ICD-10-CM | POA: Diagnosis not present

## 2022-09-03 DIAGNOSIS — Z8616 Personal history of COVID-19: Secondary | ICD-10-CM | POA: Diagnosis not present

## 2022-09-03 DIAGNOSIS — J302 Other seasonal allergic rhinitis: Secondary | ICD-10-CM | POA: Diagnosis not present

## 2022-09-03 DIAGNOSIS — Z9081 Acquired absence of spleen: Secondary | ICD-10-CM | POA: Diagnosis not present

## 2022-09-03 DIAGNOSIS — R4587 Impulsiveness: Secondary | ICD-10-CM | POA: Diagnosis not present

## 2022-09-03 DIAGNOSIS — F1020 Alcohol dependence, uncomplicated: Secondary | ICD-10-CM | POA: Diagnosis not present

## 2022-09-12 ENCOUNTER — Other Ambulatory Visit: Payer: Self-pay

## 2022-09-12 NOTE — Telephone Encounter (Signed)
Refilled: 10/28/2021 Last OV: 06/01/2022 Next OV: not scheduled

## 2022-09-13 MED ORDER — CELECOXIB 200 MG PO CAPS: 200.0000 mg | ORAL_CAPSULE | Freq: Every day | ORAL | 3 refills | Status: DC

## 2022-09-18 ENCOUNTER — Telehealth: Payer: Self-pay

## 2022-09-18 NOTE — Telephone Encounter (Signed)
Patient called requesting a refill of Enstilar foam he uses on his hands occasionally for eczema flares, he is scheduled for a follow up here 10/30/2022 [facility] pharmacy

## 2022-09-19 ENCOUNTER — Other Ambulatory Visit: Payer: Self-pay

## 2022-09-19 MED ORDER — ENSTILAR 0.005-0.064 % EX FOAM: CUTANEOUS | 0 refills | Status: DC

## 2022-10-01 ENCOUNTER — Other Ambulatory Visit: Payer: Self-pay | Admitting: Internal Medicine

## 2022-10-30 ENCOUNTER — Encounter: Payer: Self-pay | Admitting: Dermatology

## 2022-10-30 ENCOUNTER — Ambulatory Visit: Payer: BC Managed Care – PPO | Admitting: Dermatology

## 2022-10-30 VITALS — BP 134/88 | HR 84

## 2022-10-30 DIAGNOSIS — L814 Other melanin hyperpigmentation: Secondary | ICD-10-CM | POA: Diagnosis not present

## 2022-10-30 DIAGNOSIS — L578 Other skin changes due to chronic exposure to nonionizing radiation: Secondary | ICD-10-CM

## 2022-10-30 DIAGNOSIS — L821 Other seborrheic keratosis: Secondary | ICD-10-CM | POA: Diagnosis not present

## 2022-10-30 DIAGNOSIS — C4441 Basal cell carcinoma of skin of scalp and neck: Secondary | ICD-10-CM | POA: Diagnosis not present

## 2022-10-30 DIAGNOSIS — D239 Other benign neoplasm of skin, unspecified: Secondary | ICD-10-CM

## 2022-10-30 DIAGNOSIS — D229 Melanocytic nevi, unspecified: Secondary | ICD-10-CM | POA: Diagnosis not present

## 2022-10-30 DIAGNOSIS — D1801 Hemangioma of skin and subcutaneous tissue: Secondary | ICD-10-CM

## 2022-10-30 DIAGNOSIS — D485 Neoplasm of uncertain behavior of skin: Secondary | ICD-10-CM

## 2022-10-30 DIAGNOSIS — L409 Psoriasis, unspecified: Secondary | ICD-10-CM

## 2022-10-30 MED ORDER — ENSTILAR 0.005-0.064 % EX FOAM: CUTANEOUS | 6 refills | Status: AC

## 2022-10-30 NOTE — Patient Instructions (Addendum)

## 2022-10-30 NOTE — Progress Notes (Signed)
Follow-Up Visit   Subjective  Samuel Riley is a 52 y.o. male who presents for the following: Skin Cancer Screening and Full Body Skin Exam - hx of BCC, dysplastic nevi  The patient presents for Total-Body Skin Exam (TBSE) for skin cancer screening and mole check. The patient has spots, moles and lesions to be evaluated, some may be new or changing and the patient may have concern these could be cancer.  The following portions of the chart were reviewed this encounter and updated as appropriate: medications, allergies, medical history  Review of Systems:  No other skin or systemic complaints except as noted in HPI or Assessment and Plan.  Objective  Well appearing patient in no apparent distress; mood and affect are within normal limits.  A full examination was performed including scalp, head, eyes, ears, nose, lips, neck, chest, axillae, abdomen, back, buttocks, bilateral upper extremities, bilateral lower extremities, hands, feet, fingers, toes, fingernails, and toenails. All findings within normal limits unless otherwise noted below.   Relevant physical exam findings are noted in the Assessment and Plan.  R vertex scalp Pink papule with scale and telangiectasias 0.5 cm       Assessment & Plan   SKIN CANCER SCREENING PERFORMED TODAY.  ACTINIC DAMAGE - Chronic condition, secondary to cumulative UV/sun exposure - diffuse scaly erythematous macules with underlying dyspigmentation - Recommend daily broad spectrum sunscreen SPF 30+ to sun-exposed areas, reapply every 2 hours as needed.  - Staying in the shade or wearing long sleeves, sun glasses (UVA+UVB protection) and wide brim hats (4-inch brim around the entire circumference of the hat) are also recommended for sun protection.  - Call for new or changing lesions.  LENTIGINES, SEBORRHEIC KERATOSES, HEMANGIOMAS - Benign normal skin lesions - Benign-appearing - Call for any changes  MELANOCYTIC NEVI - Tan-brown and/or  pink-flesh-colored symmetric macules and papules - Benign appearing on exam today - Observation - Call clinic for new or changing moles - Recommend daily use of broad spectrum spf 30+ sunscreen to sun-exposed areas.   HISTORY OF DYSPLASTIC NEVUS No evidence of recurrence today Recommend regular full body skin exams Recommend daily broad spectrum sunscreen SPF 30+ to sun-exposed areas, reapply every 2 hours as needed.  Call if any new or changing lesions are noted between office visits  HISTORY OF BASAL CELL CARCINOMA OF THE SKIN - No evidence of recurrence today - Recommend regular full body skin exams - Recommend daily broad spectrum sunscreen SPF 30+ to sun-exposed areas, reapply every 2 hours as needed.  - Call if any new or changing lesions are noted between office visits  PSORIASIS Exam: Well-demarcated erythematous papules/plaques with silvery scale, guttate pink scaly papules of the hands. 1% BSA.  Chronic condition with duration or expected duration over one year. Currently well-controlled.  Psoriasis is a chronic non-curable, but treatable genetic/hereditary disease that may have other systemic features affecting other organ systems such as joints (Psoriatic Arthritis). It is associated with an increased risk of inflammatory bowel disease, heart disease, non-alcoholic fatty liver disease, and depression.  Treatments include light and laser treatments; topical medications; and systemic medications including oral and injectables.  Treatment Plan: Continue Enstilar foam to aa's QD-BID PRN.   Neoplasm of uncertain behavior of skin R vertex scalp  Skin / nail biopsy Type of biopsy: tangential   Informed consent: discussed and consent obtained   Timeout: patient name, date of birth, surgical site, and procedure verified   Procedure prep:  Patient was prepped and draped in  usual sterile fashion Prep type:  Isopropyl alcohol Anesthesia: the lesion was anesthetized in a standard  fashion   Anesthetic:  1% lidocaine w/ epinephrine 1-100,000 buffered w/ 8.4% NaHCO3 Instrument used: flexible razor blade   Hemostasis achieved with: pressure, aluminum chloride and electrodesiccation   Outcome: patient tolerated procedure well   Post-procedure details: sterile dressing applied and wound care instructions given   Dressing type: bandage and petrolatum    Specimen 1 - Surgical pathology Differential Diagnosis: D48.5 r/o BCC Check Margins: No  DERMATOFIBROMA - R ant thigh Exam: Firm pink/brown papulenodule with dimple sign. Treatment Plan: A dermatofibroma is a benign growth possibly related to trauma, such as an insect bite, cut from shaving, or inflamed acne-type bump.  Treatment options to remove include shave or excision with resulting scar and risk of recurrence.  Since benign-appearing and not bothersome, will observe for now.    Return in about 1 year (around 10/30/2023) for TBSE.  Maylene Roes, CMA, am acting as scribe for Elie Goody, MD .   Documentation: I have reviewed the above documentation for accuracy and completeness, and I agree with the above.  Elie Goody, MD

## 2022-11-01 ENCOUNTER — Telehealth: Payer: Self-pay

## 2022-11-01 DIAGNOSIS — C4441 Basal cell carcinoma of skin of scalp and neck: Secondary | ICD-10-CM

## 2022-11-01 NOTE — Telephone Encounter (Signed)
Called patient. Discussed pathology results. Discussed Mohs. Patient has seen Dr. Adriana Simas in [facility] in the past. Referral sent to Dr. Adriana Simas.

## 2022-11-01 NOTE — Telephone Encounter (Signed)
-----   Message from [place] sent at 11/01/2022  5:38 PM EDT ----- Diagnosis: Skin , R vertex scalp BASAL CELL CARCINOMA, NODULAR AND INFILTRATIVE PATTERNS  Please call with diagnosis and determine where the patient would like to have Mohs surgery. If the patient asks for my recommendation for Mohs surgeon, please refer to [facility]s [facility] (Dr Coralie Carpen and Dr Caprice Beaver)  Explanation: your biopsy shows a basal cell skin cancer in the second layer of the skin. This is the most common kind of skin cancer and is caused by damage from sun exposure. Basal cell skin cancers almost never spread beyond the skin, so they are not dangerous to your overall health. However, they will continue to grow, can bleed, cause nonhealing wounds, and disrupt nearby structures unless fully treated.  Treatment: Given the location and type of skin cancer, I recommend Mohs surgery. Mohs surgery involves cutting out the skin cancer and then checking under the microscope to ensure the whole skin cancer was removed. If any skin cancer remains, the surgeon will cut out more until it is fully removed. The cure rate is about 98-99%. Once the Mohs surgeon confirms the skin cancer is out, they will discuss the options to repair or heal the area. You must take it easy for about two weeks after surgery (no lifting over 10-15 lbs, avoid activity to get your heart rate and blood pressure up). It is done at another office outside of [place] ([place], [place], or [place]).

## 2023-01-17 ENCOUNTER — Inpatient Hospital Stay: Payer: BC Managed Care – PPO

## 2023-01-17 ENCOUNTER — Emergency Department: Payer: BC Managed Care – PPO | Admitting: Registered Nurse

## 2023-01-17 ENCOUNTER — Inpatient Hospital Stay
Admission: EM | Admit: 2023-01-17 | Discharge: 2023-02-04 | DRG: 025 | Disposition: A | Discharge status: Home / Self Care | Payer: BC Managed Care – PPO | Attending: Student | Admitting: Student

## 2023-01-17 ENCOUNTER — Encounter
Admission: EM | Disposition: A | Discharge status: Home / Self Care | Payer: Self-pay | Source: Home / Self Care | Attending: Student

## 2023-01-17 ENCOUNTER — Other Ambulatory Visit: Payer: Self-pay

## 2023-01-17 ENCOUNTER — Telehealth: Payer: Self-pay

## 2023-01-17 ENCOUNTER — Emergency Department: Payer: BC Managed Care – PPO

## 2023-01-17 DIAGNOSIS — S065X0A Traumatic subdural hemorrhage without loss of consciousness, initial encounter: Secondary | ICD-10-CM | POA: Diagnosis not present

## 2023-01-17 DIAGNOSIS — S066X0A Traumatic subarachnoid hemorrhage without loss of consciousness, initial encounter: Secondary | ICD-10-CM | POA: Diagnosis not present

## 2023-01-17 DIAGNOSIS — R4182 Altered mental status, unspecified: Secondary | ICD-10-CM | POA: Diagnosis not present

## 2023-01-17 DIAGNOSIS — R296 Repeated falls: Secondary | ICD-10-CM | POA: Diagnosis present

## 2023-01-17 DIAGNOSIS — R531 Weakness: Secondary | ICD-10-CM | POA: Diagnosis not present

## 2023-01-17 DIAGNOSIS — L02415 Cutaneous abscess of right lower limb: Secondary | ICD-10-CM

## 2023-01-17 DIAGNOSIS — R509 Fever, unspecified: Secondary | ICD-10-CM | POA: Diagnosis not present

## 2023-01-17 DIAGNOSIS — Z79899 Other long term (current) drug therapy: Secondary | ICD-10-CM

## 2023-01-17 DIAGNOSIS — K760 Fatty (change of) liver, not elsewhere classified: Secondary | ICD-10-CM | POA: Diagnosis not present

## 2023-01-17 DIAGNOSIS — Y908 Blood alcohol level of 240 mg/100 ml or more: Secondary | ICD-10-CM | POA: Diagnosis present

## 2023-01-17 DIAGNOSIS — F10129 Alcohol abuse with intoxication, unspecified: Secondary | ICD-10-CM | POA: Diagnosis not present

## 2023-01-17 DIAGNOSIS — E871 Hypo-osmolality and hyponatremia: Secondary | ICD-10-CM | POA: Diagnosis not present

## 2023-01-17 DIAGNOSIS — L89216 Pressure-induced deep tissue damage of right hip: Secondary | ICD-10-CM | POA: Diagnosis present

## 2023-01-17 DIAGNOSIS — E559 Vitamin D deficiency, unspecified: Secondary | ICD-10-CM | POA: Diagnosis present

## 2023-01-17 DIAGNOSIS — G9389 Other specified disorders of brain: Secondary | ICD-10-CM | POA: Diagnosis not present

## 2023-01-17 DIAGNOSIS — I615 Nontraumatic intracerebral hemorrhage, intraventricular: Secondary | ICD-10-CM | POA: Diagnosis not present

## 2023-01-17 DIAGNOSIS — S065XAA Traumatic subdural hemorrhage with loss of consciousness status unknown, initial encounter: Secondary | ICD-10-CM

## 2023-01-17 DIAGNOSIS — E669 Obesity, unspecified: Secondary | ICD-10-CM | POA: Diagnosis not present

## 2023-01-17 DIAGNOSIS — D75839 Thrombocytosis, unspecified: Secondary | ICD-10-CM | POA: Diagnosis present

## 2023-01-17 DIAGNOSIS — S5011XA Contusion of right forearm, initial encounter: Secondary | ICD-10-CM | POA: Diagnosis present

## 2023-01-17 DIAGNOSIS — S06A1XA Traumatic brain compression with herniation, initial encounter: Secondary | ICD-10-CM | POA: Diagnosis not present

## 2023-01-17 DIAGNOSIS — I80251 Phlebitis and thrombophlebitis of right calf muscular vein: Secondary | ICD-10-CM | POA: Diagnosis not present

## 2023-01-17 DIAGNOSIS — F1010 Alcohol abuse, uncomplicated: Secondary | ICD-10-CM | POA: Diagnosis not present

## 2023-01-17 DIAGNOSIS — R6520 Severe sepsis without septic shock: Secondary | ICD-10-CM | POA: Diagnosis not present

## 2023-01-17 DIAGNOSIS — D638 Anemia in other chronic diseases classified elsewhere: Secondary | ICD-10-CM | POA: Diagnosis not present

## 2023-01-17 DIAGNOSIS — R519 Headache, unspecified: Secondary | ICD-10-CM | POA: Diagnosis present

## 2023-01-17 DIAGNOSIS — B1081 Human herpesvirus 6 infection: Secondary | ICD-10-CM | POA: Diagnosis present

## 2023-01-17 DIAGNOSIS — S0990XA Unspecified injury of head, initial encounter: Secondary | ICD-10-CM | POA: Diagnosis not present

## 2023-01-17 DIAGNOSIS — Z85828 Personal history of other malignant neoplasm of skin: Secondary | ICD-10-CM

## 2023-01-17 DIAGNOSIS — B9561 Methicillin susceptible Staphylococcus aureus infection as the cause of diseases classified elsewhere: Secondary | ICD-10-CM

## 2023-01-17 DIAGNOSIS — E872 Acidosis, unspecified: Secondary | ICD-10-CM | POA: Diagnosis not present

## 2023-01-17 DIAGNOSIS — D62 Acute posthemorrhagic anemia: Secondary | ICD-10-CM | POA: Diagnosis not present

## 2023-01-17 DIAGNOSIS — R7881 Bacteremia: Secondary | ICD-10-CM | POA: Diagnosis not present

## 2023-01-17 DIAGNOSIS — Y92009 Unspecified place in unspecified non-institutional (private) residence as the place of occurrence of the external cause: Secondary | ICD-10-CM | POA: Diagnosis not present

## 2023-01-17 DIAGNOSIS — W19XXXA Unspecified fall, initial encounter: Secondary | ICD-10-CM | POA: Diagnosis present

## 2023-01-17 DIAGNOSIS — R7989 Other specified abnormal findings of blood chemistry: Secondary | ICD-10-CM | POA: Diagnosis not present

## 2023-01-17 DIAGNOSIS — M1711 Unilateral primary osteoarthritis, right knee: Secondary | ICD-10-CM | POA: Diagnosis not present

## 2023-01-17 DIAGNOSIS — F10920 Alcohol use, unspecified with intoxication, uncomplicated: Secondary | ICD-10-CM | POA: Diagnosis not present

## 2023-01-17 DIAGNOSIS — F10220 Alcohol dependence with intoxication, uncomplicated: Secondary | ICD-10-CM | POA: Diagnosis present

## 2023-01-17 DIAGNOSIS — L89116 Pressure-induced deep tissue damage of right upper back: Secondary | ICD-10-CM | POA: Diagnosis present

## 2023-01-17 DIAGNOSIS — F1020 Alcohol dependence, uncomplicated: Secondary | ICD-10-CM | POA: Diagnosis present

## 2023-01-17 DIAGNOSIS — M6282 Rhabdomyolysis: Secondary | ICD-10-CM | POA: Diagnosis not present

## 2023-01-17 DIAGNOSIS — D696 Thrombocytopenia, unspecified: Secondary | ICD-10-CM | POA: Diagnosis present

## 2023-01-17 DIAGNOSIS — F909 Attention-deficit hyperactivity disorder, unspecified type: Secondary | ICD-10-CM | POA: Diagnosis present

## 2023-01-17 DIAGNOSIS — L02416 Cutaneous abscess of left lower limb: Secondary | ICD-10-CM | POA: Diagnosis not present

## 2023-01-17 DIAGNOSIS — D72829 Elevated white blood cell count, unspecified: Secondary | ICD-10-CM | POA: Diagnosis not present

## 2023-01-17 DIAGNOSIS — R839 Unspecified abnormal finding in cerebrospinal fluid: Secondary | ICD-10-CM | POA: Diagnosis not present

## 2023-01-17 DIAGNOSIS — Z4682 Encounter for fitting and adjustment of non-vascular catheter: Secondary | ICD-10-CM | POA: Diagnosis not present

## 2023-01-17 DIAGNOSIS — Z8249 Family history of ischemic heart disease and other diseases of the circulatory system: Secondary | ICD-10-CM

## 2023-01-17 DIAGNOSIS — R9389 Abnormal findings on diagnostic imaging of other specified body structures: Secondary | ICD-10-CM | POA: Diagnosis not present

## 2023-01-17 DIAGNOSIS — M7121 Synovial cyst of popliteal space [Baker], right knee: Secondary | ICD-10-CM | POA: Diagnosis not present

## 2023-01-17 DIAGNOSIS — L89151 Pressure ulcer of sacral region, stage 1: Secondary | ICD-10-CM | POA: Diagnosis present

## 2023-01-17 DIAGNOSIS — A4101 Sepsis due to Methicillin susceptible Staphylococcus aureus: Secondary | ICD-10-CM | POA: Diagnosis not present

## 2023-01-17 DIAGNOSIS — I700 Atherosclerosis of aorta: Secondary | ICD-10-CM | POA: Diagnosis not present

## 2023-01-17 DIAGNOSIS — M25461 Effusion, right knee: Secondary | ICD-10-CM | POA: Diagnosis not present

## 2023-01-17 DIAGNOSIS — W1839XA Other fall on same level, initial encounter: Secondary | ICD-10-CM | POA: Diagnosis present

## 2023-01-17 DIAGNOSIS — F32A Depression, unspecified: Secondary | ICD-10-CM | POA: Diagnosis present

## 2023-01-17 DIAGNOSIS — E876 Hypokalemia: Secondary | ICD-10-CM | POA: Diagnosis not present

## 2023-01-17 DIAGNOSIS — L539 Erythematous condition, unspecified: Secondary | ICD-10-CM | POA: Diagnosis not present

## 2023-01-17 DIAGNOSIS — F17210 Nicotine dependence, cigarettes, uncomplicated: Secondary | ICD-10-CM | POA: Diagnosis present

## 2023-01-17 DIAGNOSIS — L89896 Pressure-induced deep tissue damage of other site: Secondary | ICD-10-CM | POA: Diagnosis present

## 2023-01-17 DIAGNOSIS — S5012XA Contusion of left forearm, initial encounter: Secondary | ICD-10-CM | POA: Diagnosis present

## 2023-01-17 DIAGNOSIS — R000 Tachycardia, unspecified: Secondary | ICD-10-CM | POA: Diagnosis not present

## 2023-01-17 DIAGNOSIS — R739 Hyperglycemia, unspecified: Secondary | ICD-10-CM | POA: Diagnosis present

## 2023-01-17 DIAGNOSIS — E785 Hyperlipidemia, unspecified: Secondary | ICD-10-CM | POA: Diagnosis present

## 2023-01-17 DIAGNOSIS — S0003XA Contusion of scalp, initial encounter: Secondary | ICD-10-CM | POA: Diagnosis not present

## 2023-01-17 DIAGNOSIS — E639 Nutritional deficiency, unspecified: Secondary | ICD-10-CM | POA: Diagnosis present

## 2023-01-17 DIAGNOSIS — G9341 Metabolic encephalopathy: Secondary | ICD-10-CM | POA: Diagnosis not present

## 2023-01-17 DIAGNOSIS — L8990 Pressure ulcer of unspecified site, unspecified stage: Secondary | ICD-10-CM | POA: Insufficient documentation

## 2023-01-17 DIAGNOSIS — G8191 Hemiplegia, unspecified affecting right dominant side: Secondary | ICD-10-CM | POA: Diagnosis not present

## 2023-01-17 DIAGNOSIS — K828 Other specified diseases of gallbladder: Secondary | ICD-10-CM | POA: Diagnosis not present

## 2023-01-17 DIAGNOSIS — Z9081 Acquired absence of spleen: Secondary | ICD-10-CM

## 2023-01-17 DIAGNOSIS — R600 Localized edema: Secondary | ICD-10-CM | POA: Diagnosis not present

## 2023-01-17 DIAGNOSIS — D72828 Other elevated white blood cell count: Secondary | ICD-10-CM | POA: Diagnosis not present

## 2023-01-17 DIAGNOSIS — I6200 Nontraumatic subdural hemorrhage, unspecified: Secondary | ICD-10-CM | POA: Diagnosis not present

## 2023-01-17 DIAGNOSIS — M7989 Other specified soft tissue disorders: Secondary | ICD-10-CM | POA: Diagnosis not present

## 2023-01-17 DIAGNOSIS — I10 Essential (primary) hypertension: Secondary | ICD-10-CM | POA: Diagnosis not present

## 2023-01-17 DIAGNOSIS — R0689 Other abnormalities of breathing: Secondary | ICD-10-CM | POA: Diagnosis not present

## 2023-01-17 DIAGNOSIS — L089 Local infection of the skin and subcutaneous tissue, unspecified: Secondary | ICD-10-CM | POA: Diagnosis not present

## 2023-01-17 HISTORY — DX: Weakness: R53.1

## 2023-01-17 HISTORY — PX: CRANIOTOMY: SHX93

## 2023-01-17 HISTORY — DX: Traumatic subdural hemorrhage with loss of consciousness status unknown, initial encounter: S06.5XAA

## 2023-01-17 LAB — URINE DRUG SCREEN, QUALITATIVE (ARMC ONLY)
Amphetamines, Ur Screen: NOT DETECTED
Barbiturates, Ur Screen: NOT DETECTED
Benzodiazepine, Ur Scrn: NOT DETECTED
Cannabinoid 50 Ng, Ur ~~LOC~~: NOT DETECTED
Cocaine Metabolite,Ur ~~LOC~~: NOT DETECTED
MDMA (Ecstasy)Ur Screen: NOT DETECTED
Methadone Scn, Ur: NOT DETECTED
Opiate, Ur Screen: NOT DETECTED
Phencyclidine (PCP) Ur S: NOT DETECTED
Tricyclic, Ur Screen: NOT DETECTED

## 2023-01-17 LAB — PROCALCITONIN: Procalcitonin: 0.12 ng/mL

## 2023-01-17 LAB — URINALYSIS, COMPLETE (UACMP) WITH MICROSCOPIC
Bilirubin Urine: NEGATIVE
Glucose, UA: 50 mg/dL — AB
Ketones, ur: 20 mg/dL — AB
Leukocytes,Ua: NEGATIVE
Nitrite: NEGATIVE
Protein, ur: 100 mg/dL — AB
Specific Gravity, Urine: 1.025 (ref 1.005–1.030)
pH: 5 (ref 5.0–8.0)

## 2023-01-17 LAB — CBC
Hemoglobin: 14.1 g/dL (ref 13.0–17.0)
Hemoglobin: 11.4 g/dL — ABNORMAL LOW (ref 13.0–17.0)
Platelets: 143 10*3/uL — ABNORMAL LOW (ref 150–400)
Platelets: 125 10*3/uL — ABNORMAL LOW (ref 150–400)
WBC: 13.1 10*3/uL — ABNORMAL HIGH (ref 4.0–10.5)
WBC: 13.2 10*3/uL — ABNORMAL HIGH (ref 4.0–10.5)

## 2023-01-17 LAB — COMPREHENSIVE METABOLIC PANEL
ALT: 95 U/L — ABNORMAL HIGH (ref 0–44)
AST: 311 U/L — ABNORMAL HIGH (ref 15–41)
Albumin: 4.2 g/dL (ref 3.5–5.0)
Alkaline Phosphatase: 59 U/L (ref 38–126)
Anion gap: 18 — ABNORMAL HIGH (ref 5–15)
BUN: 15 mg/dL (ref 6–20)
CO2: 19 mmol/L — ABNORMAL LOW (ref 22–32)
Calcium: 7.8 mg/dL — ABNORMAL LOW (ref 8.9–10.3)
Chloride: 90 mmol/L — ABNORMAL LOW (ref 98–111)
Creatinine, Ser: 0.59 mg/dL — ABNORMAL LOW (ref 0.61–1.24)
GFR, Estimated: >60 mL/min (ref >60)
Glucose, Bld: 208 mg/dL — ABNORMAL HIGH (ref 70–99)
Potassium: 3.9 mmol/L (ref 3.5–5.1)
Sodium: 127 mmol/L — ABNORMAL LOW (ref 135–145)
Total Bilirubin: 2.5 mg/dL — ABNORMAL HIGH (ref <1.2)
Total Protein: 7.4 g/dL (ref 6.5–8.1)

## 2023-01-17 LAB — BLOOD GAS, ARTERIAL
Acid-base deficit: 5.6 mmol/L — ABNORMAL HIGH (ref 0.0–2.0)
Bicarbonate: 20.1 mmol/L (ref 20.0–28.0)
FIO2: 40 %
MECHVT: 500 mL
O2 Saturation: 100 %
PEEP: 5 cmH2O
Patient temperature: 37.4
RATE: 14 {breaths}/min
pCO2 arterial: 40 mm[Hg] (ref 32–48)
pH, Arterial: 7.31 — ABNORMAL LOW (ref 7.35–7.45)
pO2, Arterial: 165 mm[Hg] — ABNORMAL HIGH (ref 83–108)

## 2023-01-17 LAB — BASIC METABOLIC PANEL
Anion gap: 15 (ref 5–15)
BUN: 13 mg/dL (ref 6–20)
CO2: 19 mmol/L — ABNORMAL LOW (ref 22–32)
Calcium: 7.1 mg/dL — ABNORMAL LOW (ref 8.9–10.3)
Chloride: 95 mmol/L — ABNORMAL LOW (ref 98–111)
Creatinine, Ser: 0.57 mg/dL — ABNORMAL LOW (ref 0.61–1.24)
GFR, Estimated: >60 mL/min (ref >60)
Glucose, Bld: 189 mg/dL — ABNORMAL HIGH (ref 70–99)
Potassium: 4 mmol/L (ref 3.5–5.1)
Sodium: 129 mmol/L — ABNORMAL LOW (ref 135–145)

## 2023-01-17 LAB — LACTIC ACID, PLASMA: Lactic Acid, Venous: 3.1 mmol/L (ref 0.5–1.9)

## 2023-01-17 LAB — GLUCOSE, CAPILLARY
Glucose-Capillary: 167 mg/dL — ABNORMAL HIGH (ref 70–99)
Glucose-Capillary: 197 mg/dL — ABNORMAL HIGH (ref 70–99)

## 2023-01-17 LAB — PHOSPHORUS: Phosphorus: 1.3 mg/dL — ABNORMAL LOW (ref 2.5–4.6)

## 2023-01-17 LAB — PROTIME-INR
INR: 1 (ref 0.8–1.2)
Prothrombin Time: 13.8 s (ref 11.4–15.2)

## 2023-01-17 LAB — ETHANOL: Alcohol, Ethyl (B): 466 mg/dL (ref <10)

## 2023-01-17 LAB — MAGNESIUM: Magnesium: 2.3 mg/dL (ref 1.7–2.4)

## 2023-01-17 LAB — APTT: aPTT: 23 s — ABNORMAL LOW (ref 24–36)

## 2023-01-17 LAB — ABO/RH: ABO/RH(D): A POS

## 2023-01-17 LAB — CK: Total CK: 14146 U/L — ABNORMAL HIGH (ref 49–397)

## 2023-01-17 SURGERY — CRANIOTOMY HEMATOMA EVACUATION SUBDURAL
Anesthesia: General | Site: Head | Laterality: Left

## 2023-01-17 MED ORDER — PHENYLEPHRINE HCL-NACL 20-0.9 MG/250ML-% IV SOLN
INTRAVENOUS | Status: AC
  Filled 2023-01-17: qty 250

## 2023-01-17 MED ORDER — THIAMINE HCL 100 MG/ML IJ SOLN
100.0000 mg | INTRAMUSCULAR | Status: DC
  Administered 2023-01-20 – 2023-01-22 (×3): 100 mg via INTRAVENOUS
  Filled 2023-01-17 (×3): qty 2
  Filled 2023-01-17: qty 1

## 2023-01-17 MED ORDER — MANNITOL 25 % IV SOLN
INTRAVENOUS | Status: AC
  Filled 2023-01-17: qty 50

## 2023-01-17 MED ORDER — LIDOCAINE-EPINEPHRINE 1 %-1:100000 IJ SOLN
INTRAMUSCULAR | Status: AC
  Filled 2023-01-17: qty 1

## 2023-01-17 MED ORDER — ONDANSETRON HCL 4 MG/2ML IJ SOLN
INTRAMUSCULAR | Status: AC
  Filled 2023-01-17: qty 2

## 2023-01-17 MED ORDER — SODIUM CHLORIDE 0.9 % IV SOLN
INTRAVENOUS | Status: DC | PRN
  Administered 2023-01-17: 500 mg via INTRAVENOUS

## 2023-01-17 MED ORDER — LACTATED RINGERS IV SOLN: INTRAVENOUS | Status: DC | PRN

## 2023-01-17 MED ORDER — FENTANYL CITRATE PF 50 MCG/ML IJ SOSY: 50.0000 ug | PREFILLED_SYRINGE | INTRAMUSCULAR | Status: DC | PRN

## 2023-01-17 MED ORDER — PHENYLEPHRINE 80 MCG/ML (10ML) SYRINGE FOR IV PUSH (FOR BLOOD PRESSURE SUPPORT)
PREFILLED_SYRINGE | INTRAVENOUS | Status: AC
  Filled 2023-01-17: qty 10

## 2023-01-17 MED ORDER — 0.9 % SODIUM CHLORIDE (POUR BTL) OPTIME
TOPICAL | Status: DC | PRN
  Administered 2023-01-17: 2000 mL

## 2023-01-17 MED ORDER — PROPOFOL 1000 MG/100ML IV EMUL
INTRAVENOUS | Status: AC
  Filled 2023-01-17: qty 100

## 2023-01-17 MED ORDER — POLYETHYLENE GLYCOL 3350 17 G PO PACK
17.0000 g | PACK | Freq: Every day | ORAL | Status: DC
  Filled 2023-01-17: qty 1

## 2023-01-17 MED ORDER — ROCURONIUM BROMIDE 100 MG/10ML IV SOLN
INTRAVENOUS | Status: DC | PRN
  Administered 2023-01-17 (×2): 30 mg via INTRAVENOUS
  Administered 2023-01-17: 70 mg via INTRAVENOUS

## 2023-01-17 MED ORDER — FENTANYL BOLUS VIA INFUSION
50.0000 ug | INTRAVENOUS | Status: DC | PRN
  Administered 2023-01-17: 50 ug via INTRAVENOUS

## 2023-01-17 MED ORDER — PROPOFOL 1000 MG/100ML IV EMUL
0.0000 ug/kg/min | INTRAVENOUS | Status: DC
  Administered 2023-01-17: 60 ug/kg/min via INTRAVENOUS
  Administered 2023-01-18: 70 ug/kg/min via INTRAVENOUS
  Administered 2023-01-18: 80 ug/kg/min via INTRAVENOUS
  Filled 2023-01-17 (×5): qty 100

## 2023-01-17 MED ORDER — SODIUM PHOSPHATES 45 MMOLE/15ML IV SOLN
30.0000 mmol | Freq: Once | INTRAVENOUS | Status: AC
  Administered 2023-01-17: 30 mmol via INTRAVENOUS
  Filled 2023-01-17: qty 10

## 2023-01-17 MED ORDER — FENTANYL CITRATE PF 50 MCG/ML IJ SOSY
50.0000 ug | PREFILLED_SYRINGE | Freq: Once | INTRAMUSCULAR | Status: AC
  Administered 2023-01-17: 50 ug via INTRAVENOUS
  Filled 2023-01-17: qty 1

## 2023-01-17 MED ORDER — SENNOSIDES-DOCUSATE SODIUM 8.6-50 MG PO TABS
1.0000 | ORAL_TABLET | Freq: Two times a day (BID) | ORAL | Status: DC
  Administered 2023-01-17 – 2023-01-19 (×3): 1 via ORAL
  Filled 2023-01-17 (×3): qty 1

## 2023-01-17 MED ORDER — PHENYLEPHRINE HCL-NACL 20-0.9 MG/250ML-% IV SOLN
25.0000 ug/min | INTRAVENOUS | Status: DC
  Filled 2023-01-17: qty 250

## 2023-01-17 MED ORDER — PROPOFOL 10 MG/ML IV BOLUS
INTRAVENOUS | Status: AC
  Filled 2023-01-17: qty 20

## 2023-01-17 MED ORDER — LIDOCAINE-EPINEPHRINE 1 %-1:100000 IJ SOLN
INTRAMUSCULAR | Status: DC | PRN
  Administered 2023-01-17: 10 mL

## 2023-01-17 MED ORDER — DEXAMETHASONE SODIUM PHOSPHATE 10 MG/ML IJ SOLN
INTRAMUSCULAR | Status: DC | PRN
  Administered 2023-01-17: 10 mg via INTRAVENOUS

## 2023-01-17 MED ORDER — SODIUM CHLORIDE 0.9 % IV SOLN: INTRAVENOUS | Status: AC

## 2023-01-17 MED ORDER — SODIUM CHLORIDE 0.9 % IV BOLUS
1000.0000 mL | Freq: Once | INTRAVENOUS | Status: AC
  Administered 2023-01-17: 1000 mL via INTRAVENOUS

## 2023-01-17 MED ORDER — FOLIC ACID 1 MG PO TABS
1.0000 mg | ORAL_TABLET | Freq: Every day | ORAL | Status: DC
  Administered 2023-01-18: 1 mg
  Filled 2023-01-17: qty 1

## 2023-01-17 MED ORDER — DEXAMETHASONE SODIUM PHOSPHATE 10 MG/ML IJ SOLN
INTRAMUSCULAR | Status: AC
  Filled 2023-01-17: qty 1

## 2023-01-17 MED ORDER — FAMOTIDINE 20 MG PO TABS
20.0000 mg | ORAL_TABLET | Freq: Two times a day (BID) | ORAL | Status: DC
  Administered 2023-01-17 – 2023-01-18 (×2): 20 mg
  Filled 2023-01-17 (×2): qty 1

## 2023-01-17 MED ORDER — PHENYLEPHRINE HCL-NACL 20-0.9 MG/250ML-% IV SOLN
INTRAVENOUS | Status: DC | PRN
  Administered 2023-01-17: 25 ug/min via INTRAVENOUS

## 2023-01-17 MED ORDER — FENTANYL CITRATE (PF) 100 MCG/2ML IJ SOLN
INTRAMUSCULAR | Status: DC | PRN
  Administered 2023-01-17 (×2): 50 ug via INTRAVENOUS

## 2023-01-17 MED ORDER — FENTANYL CITRATE (PF) 100 MCG/2ML IJ SOLN
INTRAMUSCULAR | Status: AC
  Filled 2023-01-17: qty 2

## 2023-01-17 MED ORDER — FENTANYL 2500MCG IN NS 250ML (10MCG/ML) PREMIX INFUSION
0.0000 ug/h | INTRAVENOUS | Status: DC
  Administered 2023-01-17: 25 ug/h via INTRAVENOUS
  Administered 2023-01-18: 200 ug/h via INTRAVENOUS
  Filled 2023-01-17 (×2): qty 250

## 2023-01-17 MED ORDER — ORAL CARE MOUTH RINSE: 15.0000 mL | OROMUCOSAL | Status: DC | PRN

## 2023-01-17 MED ORDER — ROCURONIUM BROMIDE 10 MG/ML (PF) SYRINGE
PREFILLED_SYRINGE | INTRAVENOUS | Status: AC
  Filled 2023-01-17: qty 10

## 2023-01-17 MED ORDER — LABETALOL HCL 5 MG/ML IV SOLN: 10.0000 mg | INTRAVENOUS | Status: DC | PRN

## 2023-01-17 MED ORDER — PROPOFOL 10 MG/ML IV BOLUS
INTRAVENOUS | Status: DC | PRN
  Administered 2023-01-17: 150 mg via INTRAVENOUS
  Administered 2023-01-17: 100 ug/kg/min via INTRAVENOUS

## 2023-01-17 MED ORDER — FOLIC ACID 1 MG PO TABS
1.0000 mg | ORAL_TABLET | Freq: Every day | ORAL | Status: DC
  Filled 2023-01-17: qty 1

## 2023-01-17 MED ORDER — CEFAZOLIN SODIUM-DEXTROSE 2-3 GM-%(50ML) IV SOLR
INTRAVENOUS | Status: DC | PRN
  Administered 2023-01-17: 2 g via INTRAVENOUS

## 2023-01-17 MED ORDER — MIDAZOLAM HCL 2 MG/2ML IJ SOLN
1.0000 mg | INTRAMUSCULAR | Status: DC | PRN
  Administered 2023-01-17 – 2023-01-18 (×5): 2 mg via INTRAVENOUS
  Filled 2023-01-17 (×4): qty 2

## 2023-01-17 MED ORDER — PHENYLEPHRINE 80 MCG/ML (10ML) SYRINGE FOR IV PUSH (FOR BLOOD PRESSURE SUPPORT)
PREFILLED_SYRINGE | INTRAVENOUS | Status: DC | PRN
  Administered 2023-01-17: 80 ug via INTRAVENOUS
  Administered 2023-01-17 (×3): 160 ug via INTRAVENOUS
  Administered 2023-01-17: 80 ug via INTRAVENOUS
  Administered 2023-01-17: 160 ug via INTRAVENOUS
  Administered 2023-01-17 (×2): 80 ug via INTRAVENOUS

## 2023-01-17 MED ORDER — POLYETHYLENE GLYCOL 3350 17 G PO PACK
17.0000 g | PACK | Freq: Every day | ORAL | Status: DC | PRN
Start: 2023-01-17 — End: 2023-02-04

## 2023-01-17 MED ORDER — INSULIN ASPART 100 UNIT/ML IJ SOLN
0.0000 [IU] | INTRAMUSCULAR | Status: DC
  Administered 2023-01-17 – 2023-01-18 (×5): 2 [IU] via SUBCUTANEOUS
  Administered 2023-01-18: 1 [IU] via SUBCUTANEOUS
  Administered 2023-01-18 – 2023-01-19 (×3): 2 [IU] via SUBCUTANEOUS
  Administered 2023-01-19 – 2023-01-20 (×2): 1 [IU] via SUBCUTANEOUS
  Administered 2023-01-20 (×2): 2 [IU] via SUBCUTANEOUS
  Administered 2023-01-20 – 2023-01-21 (×2): 1 [IU] via SUBCUTANEOUS
  Administered 2023-01-21: 3 [IU] via SUBCUTANEOUS
  Administered 2023-01-21: 1 [IU] via SUBCUTANEOUS
  Administered 2023-01-21 – 2023-01-22 (×4): 2 [IU] via SUBCUTANEOUS
  Filled 2023-01-17 (×22): qty 1

## 2023-01-17 MED ORDER — SODIUM CHLORIDE 0.9% IV SOLUTION: Freq: Once | INTRAVENOUS | Status: DC

## 2023-01-17 MED ORDER — FENTANYL BOLUS VIA INFUSION
50.0000 ug | INTRAVENOUS | Status: DC | PRN
  Administered 2023-01-17: 100 ug via INTRAVENOUS

## 2023-01-17 MED ORDER — ONDANSETRON HCL 4 MG/2ML IJ SOLN
INTRAMUSCULAR | Status: DC | PRN
  Administered 2023-01-17: 4 mg via INTRAVENOUS

## 2023-01-17 MED ORDER — THIAMINE HCL 100 MG/ML IJ SOLN
500.0000 mg | INTRAVENOUS | Status: AC
  Administered 2023-01-17 – 2023-01-19 (×3): 500 mg via INTRAVENOUS
  Filled 2023-01-17 (×4): qty 5

## 2023-01-17 MED ORDER — SODIUM CHLORIDE 0.9 % IV SOLN: 250.0000 mL | INTRAVENOUS | Status: AC

## 2023-01-17 MED ORDER — LEVETIRACETAM IN NACL 500 MG/100ML IV SOLN
500.0000 mg | Freq: Two times a day (BID) | INTRAVENOUS | Status: DC
  Administered 2023-01-17 – 2023-01-19 (×5): 500 mg via INTRAVENOUS
  Filled 2023-01-17 (×6): qty 100

## 2023-01-17 MED ORDER — FENTANYL CITRATE PF 50 MCG/ML IJ SOSY: 50.0000 ug | PREFILLED_SYRINGE | Freq: Once | INTRAMUSCULAR | Status: DC | PRN

## 2023-01-17 MED ORDER — LEVETIRACETAM 500 MG/5ML IV SOLN
INTRAVENOUS | Status: AC
  Filled 2023-01-17: qty 5

## 2023-01-17 MED ORDER — DOCUSATE SODIUM 50 MG/5ML PO LIQD
100.0000 mg | Freq: Two times a day (BID) | ORAL | Status: DC
  Administered 2023-01-17: 100 mg
  Filled 2023-01-17: qty 10

## 2023-01-17 MED ORDER — ORAL CARE MOUTH RINSE
15.0000 mL | OROMUCOSAL | Status: DC
  Administered 2023-01-17 – 2023-01-19 (×15): 15 mL via OROMUCOSAL

## 2023-01-17 MED ORDER — BACITRACIN ZINC 500 UNIT/GM EX OINT
TOPICAL_OINTMENT | CUTANEOUS | Status: AC
  Filled 2023-01-17: qty 28.35

## 2023-01-17 MED ORDER — DOCUSATE SODIUM 100 MG PO CAPS: 100.0000 mg | ORAL_CAPSULE | Freq: Two times a day (BID) | ORAL | Status: DC | PRN

## 2023-01-17 SURGICAL SUPPLY — 76 items
APPLICATOR CHLORAPREP 10.5 ORG (MISCELLANEOUS) ×4 IMPLANT
BASIN GRAD PLASTIC 32OZ STRL (MISCELLANEOUS) ×2 IMPLANT
BLADE CLIPPER SPEC (BLADE) ×2 IMPLANT
BLADE SURG 15 STRL LF DISP TIS (BLADE) ×2 IMPLANT
BUR ACORN 7.5 PRECISION (BURR) ×2 IMPLANT
BUR SPIRAL ROUTER 2.3 (BUR) ×2 IMPLANT
CNTNR URN SCR LID CUP LEK RST (MISCELLANEOUS) ×6 IMPLANT
COUNTER NEEDLE 20/40 LG (NEEDLE) ×2 IMPLANT
COVER LIGHT HANDLE STERIS (MISCELLANEOUS) IMPLANT
DRAIN CHANNEL JP 10F RND 20C F (MISCELLANEOUS) IMPLANT
DRAIN WOUND RND W/TROCAR (DRAIN) IMPLANT
DRAPE INCISE 23X17 STRL (DRAPES) ×4 IMPLANT
DRAPE INCISE IOBAN 23X17 STRL (DRAPES) ×2 IMPLANT
DRAPE INCISE IOBAN 66X45 STRL (DRAPES) ×2 IMPLANT
DRAPE SURG 17X11 SM STRL (DRAPES) ×8 IMPLANT
DRAPE WARM FLUID 44X44 (DRAPES) ×2 IMPLANT
DRSG TEGADERM 4X4.75 (GAUZE/BANDAGES/DRESSINGS) ×2 IMPLANT
DRSG TELFA 3X8 NADH STRL (GAUZE/BANDAGES/DRESSINGS) IMPLANT
ELECT CAUTERY BLADE TIP 2.5 (TIP) ×1 IMPLANT
ELECT REM PT RETURN 9FT ADLT (ELECTROSURGICAL) ×1 IMPLANT
ELECTRODE CAUTERY BLDE TIP 2.5 (TIP) ×2 IMPLANT
ELECTRODE REM PT RTRN 9FT ADLT (ELECTROSURGICAL) ×2 IMPLANT
EVACUATOR DRAINAGE 7X20 100CC (MISCELLANEOUS) IMPLANT
EVACUATOR SILICONE 100CC (DRAIN) IMPLANT
GAUZE 4X4 16PLY ~~LOC~~+RFID DBL (SPONGE) ×6 IMPLANT
GAUZE SPONGE 4X4 12PLY STRL (GAUZE/BANDAGES/DRESSINGS) IMPLANT
GAUZE XEROFORM 1X8 LF (GAUZE/BANDAGES/DRESSINGS) ×2 IMPLANT
GLOVE BIOGEL PI IND STRL 6.5 (GLOVE) ×2 IMPLANT
GLOVE BIOGEL PI IND STRL 8.5 (GLOVE) ×4 IMPLANT
GLOVE SURG SYN 6.5 ES PF (GLOVE) ×1 IMPLANT
GLOVE SURG SYN 6.5 PF PI (GLOVE) ×2 IMPLANT
GLOVE SURG SYN 8.0 (GLOVE) ×1 IMPLANT
GLOVE SURG SYN 8.0 PF PI (GLOVE) IMPLANT
GLOVE SURG SYN 8.5 E (GLOVE) ×4 IMPLANT
GLOVE SURG SYN 8.5 PF PI (GLOVE) ×8 IMPLANT
GOWN SRG LRG LVL 4 IMPRV REINF (GOWNS) ×2 IMPLANT
GOWN SRG XL LVL 3 NONREINFORCE (GOWNS) ×2 IMPLANT
GRADUATE 1200CC STRL 31836 (MISCELLANEOUS) ×2 IMPLANT
HEMOSTAT SURGICEL 2X14 (HEMOSTASIS) IMPLANT
HEMOSTAT SURGICEL 2X3 (HEMOSTASIS) IMPLANT
HEMOSTAT SURGICEL 4X8 (HEMOSTASIS) IMPLANT
HOLDER FOLEY CATH W/STRAP (MISCELLANEOUS) ×2 IMPLANT
HOOK STAY BLUNT/RETRACTOR 5M (MISCELLANEOUS) ×2 IMPLANT
KIT TURNOVER KIT A (KITS) ×2 IMPLANT
MANIFOLD NEPTUNE II (INSTRUMENTS) ×2 IMPLANT
MARKER SKIN DUAL TIP RULER LAB (MISCELLANEOUS) ×4 IMPLANT
MAT ABSORB FLUID 56X50 GRAY (MISCELLANEOUS) ×2 IMPLANT
NDL HYPO 22X1.5 SAFETY MO (MISCELLANEOUS) ×2 IMPLANT
NEEDLE HYPO 22X1.5 SAFETY MO (MISCELLANEOUS) ×1 IMPLANT
PACK CRANIOTOMY CUSTOM (CUSTOM PROCEDURE TRAY) ×2 IMPLANT
PAD ARMBOARD 7.5X6 YLW CONV (MISCELLANEOUS) ×4 IMPLANT
PIN MAYFIELD SKULL DISP (PIN) IMPLANT
PLATE 1.5 6HOLE XLONG DBL Y (Plate) IMPLANT
PLATE 1.5/0.5 22MM BURR HO (Plate) IMPLANT
Reservoir Kit Jackson Pratt 7mm flat IMPLANT
SCREW SELF DRILL HT 1.5/4MM (Screw) IMPLANT
SET CATH VENT DRAIN 3-15 1.9D (DRAIN) IMPLANT
SHEET NEURO XL SOL CTL (MISCELLANEOUS) ×2 IMPLANT
SOL PREP PVP 2OZ (MISCELLANEOUS) ×1 IMPLANT
SOL SCRUB PVP POV-IOD 4OZ 7.5% (MISCELLANEOUS) ×1 IMPLANT
SOLUTION PREP PVP 2OZ (MISCELLANEOUS) ×2 IMPLANT
SOLUTION SCRB POV-IOD 4OZ 7.5% (MISCELLANEOUS) ×2 IMPLANT
STAPLER SKIN PROX 35W (STAPLE) ×4 IMPLANT
SURGIFLO W/THROMBIN 8M KIT (HEMOSTASIS) ×4 IMPLANT
SURGILUBE 2OZ TUBE FLIPTOP (MISCELLANEOUS) ×2 IMPLANT
SUT NURALON 4 0 TR CR/8 (SUTURE) ×2 IMPLANT
SUT VIC AB 2-0 CT1 18 (SUTURE) ×4 IMPLANT
SUT VICRYL 2-0 SH 8X27 (SUTURE) IMPLANT
SYR 10ML LL (SYRINGE) ×2 IMPLANT
SYR 20ML LL LF (SYRINGE) ×4 IMPLANT
TAPE CLOTH 3X10 WHT NS LF (GAUZE/BANDAGES/DRESSINGS) ×2 IMPLANT
TOWEL OR 17X26 4PK STRL BLUE (TOWEL DISPOSABLE) ×8 IMPLANT
TRAP FLUID SMOKE EVACUATOR (MISCELLANEOUS) ×2 IMPLANT
TRAY FOLEY SLVR 16FR TEMP STAT (SET/KITS/TRAYS/PACK) ×2 IMPLANT
WATER STERILE IRR 1000ML POUR (IV SOLUTION) ×6 IMPLANT
WATER STERILE IRR 500ML POUR (IV SOLUTION) ×2 IMPLANT

## 2023-01-17 NOTE — ED Notes (Signed)
Pt continues to thrash around in bed. RN attempting to keep pt safe but pt hard to redirect. Pt taken to room 24 in case of decomp and for pt safety 1:1 sitter provided

## 2023-01-17 NOTE — ED Triage Notes (Addendum)
Pt to ED via [facility] from home. Ems was called by brother due to etoh intoxication and multiple falls. Family state pt has been on an alcohol binder x2wks. Family states pt drinks about a 5th or more of alcohol daily. Pt has bilateral bruising to eyes, bruising to left neck and bruising/swelling to right arm and right leg. Pt's mouth is dry and pt is hiccupping excessively.   EMS VS:  160/100 CBG 184 99% RA

## 2023-01-17 NOTE — Progress Notes (Signed)
Pt arrived via stretcher from ED. Pt is oriented to self not sure why he has to be here.  Pt cleansed with CHG wipes. Pt has severe bruising on all extremities and bruising on his back too. Bilateral periorbital bruising. Phone consent given by his brother Vanny Goette, d/t pt's intoxication. Pt transferred to OR via stretcher accompanied by CRNA's, orderly, and Dr Suzan Slick.

## 2023-01-17 NOTE — Transfer of Care (Signed)
Immediate Anesthesia Transfer of Care Note  Patient: Samuel Riley  Procedure(s) Performed: CRANIOTOMY HEMATOMA EVACUATION SUBDURAL (Left: Head)  Patient Location: ICU  Anesthesia Type:General  Level of Consciousness: sedated and Patient remains intubated per anesthesia plan  Airway & Oxygen Therapy: Patient remains intubated per anesthesia plan and Patient placed on Ventilator (see vital sign flow sheet for setting)  Post-op Assessment: Report given to RN and Post -op Vital signs reviewed and stable  Post vital signs: Reviewed and stable  Last Vitals:  Vitals Value Taken Time  BP 139/84 01/17/23 2025  Temp    Pulse 116 01/17/23 2027  Resp 17 01/17/23 2030  SpO2 98 % 01/17/23 2027  Vitals shown include unfiled device data.  Last Pain:  Vitals:   01/17/23 1655  TempSrc:   PainSc: 0-No pain         Complications: No notable events documented.

## 2023-01-17 NOTE — Consult Note (Signed)
PHARMACY CONSULT NOTE - ELECTROLYTES  Pharmacy Consult for Electrolyte Monitoring and Replacement   Recent Labs: Potassium (mmol/L)  Date Value  01/17/2023 3.9   Magnesium (mg/dL)  Date Value  16/11/9602 2.3   Calcium (mg/dL)  Date Value  54/10/8117 7.8 (L)   Albumin (g/dL)  Date Value  14/78/2956 4.2   Phosphorus (mg/dL)  Date Value  21/30/8657 1.3 (L)   Sodium (mmol/L)  Date Value  01/17/2023 127 (L)   Assessment  Samuel Riley is a 52 y.o. male presenting with alcohol intoxication. Pharmacy has been consulted to monitor and replace electrolytes.  Diet: NPO  MIVF: None   Goal of Therapy: Electrolytes WNL  Plan:  Phos 1.3; will order sodium phosphate 30 mmol IV x 1  Check BMP, Mg, Phos with AM labs  Thank you for allowing pharmacy to be a part of this patient's care.  Littie Deeds, PharmD Pharmacy Resident  01/17/2023 5:09 PM
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
## 2023-01-17 NOTE — Telephone Encounter (Signed)
Dr Katrinka Blazing saw this patient as a consult 01/17/23. He is scheduled for surgery (craniotomy for subdural hematoma evacuation) on 01/17/23. Per discussion with Dr Katrinka Blazing, he will need a post op appointment in 2 weeks and another in 4-6 weeks. He will need a repeat head CT prior to the 2nd appointment. I have placed the order for the CT. Please make sure the head CT is scheduled at the appropriate time frame and that the patient is aware of both post op appointments. Thank you!

## 2023-01-17 NOTE — Progress Notes (Signed)
eLink Physician-Brief Progress Note Patient Name: Samuel Riley DOB: 1970/08/05 MRN: [id number]   Date of Service  01/17/2023  HPI/Events of Note  52 year old male with a history of alcohol use disorder admitted to the ICU after evacuation of a large left subdural hematoma with significant midline shift after traumatic fall while intoxicated.  Admitted to the ICU for postoperative ventilator management.  On examination, he is tachycardic, minimally hypertensive, saturating 100% on 40% FiO2.  Requiring phenylephrine infusion, propofol, and intermittent analgesia.  Results consistent with anion gap metabolic acidosis, mild electrolyte disturbances, rhabdomyolysis, leukocytosis and acute alcohol intoxication.  CT head consistent with subdural hematoma  eICU Interventions  Ground team at bedside.  Postoperative neurochecks  Patient blood pressure control for SBP less than 140-150.  Currently on vasopressors  Maintain continuous IVF.  Trend CK  CIWA protocol with systemic sedation and Warnicke prophylaxis.  Seizure prophylaxis with Keppra  GI prophylaxis with famotidine DVT prophylaxis with SCDs     Intervention Category Evaluation Type: New Patient Evaluation  Samuel Riley 01/17/2023, 9:12 PM
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
## 2023-01-17 NOTE — Anesthesia Procedure Notes (Signed)
Procedure Name: Intubation Date/Time: 01/17/2023 5:51 PM  Performed by: Joanette Gula, Lakevia Perris, CRNAPre-anesthesia Checklist: Patient identified, Emergency Drugs available, Suction available and Patient being monitored Patient Re-evaluated:Patient Re-evaluated prior to induction Oxygen Delivery Method: Circle system utilized Preoxygenation: Pre-oxygenation with 100% oxygen Induction Type: IV induction and Rapid sequence Tube type: Oral Tube size: 7.0 mm Number of attempts: 1 Airway Equipment and Method: Stylet and Oral airway Placement Confirmation: ETT inserted through vocal cords under direct vision, positive ETCO2 and breath sounds checked- equal and bilateral Secured at: 20 cm Tube secured with: Tape Dental Injury: Teeth and Oropharynx as per pre-operative assessment

## 2023-01-17 NOTE — ED Notes (Signed)
Pt hugging side rail with head on rail. Unable to find seizure pads at this time. Pillows placed on rails for pt safety

## 2023-01-17 NOTE — ED Provider Notes (Addendum)
[facility] Provider Note    Event Date/Time   First MD Initiated Contact with Patient 01/17/23 1510     (approximate)   History   Alcohol Intoxication and Fall   HPI  Samuel Riley is a 52 y.o. male   Past medical history of alcohol use presents emerged apartment with alcohol intoxication.  Reports of multiple falls while intoxicated.  Family called stating that he is been using alcohol excessively over the last several weeks.  Patient reports drinking alcohol, has no intention to stop drinking alcohol, does report multiple falls while intoxicated.  He has bruising throughout his body notably on bilateral forearms and around both eyes.  He states that he last drank just prior to arrival to the emergency department.  He denies drug use.  He has no other acute medical complaints.  External Medical Documents Reviewed: Patient visit with internal medicine in April 2024 documented history of alcohol abuse, has been through several treatment centers since his visit at that time, and has been sober since his last rehab visit      Physical Exam   Triage Vital Signs: ED Triage Vitals [01/17/23 1512]  Encounter Vitals Group     BP (!) 129/115     Systolic BP Percentile      Diastolic BP Percentile      Pulse Rate (!) 120     Resp 20     Temp 97.9 F (36.6 C)     Temp Source Axillary     SpO2 99 %     Weight      Height      Head Circumference      Peak Flow      Pain Score      Pain Loc      Pain Education      Exclude from Growth Chart     Most recent vital signs: Vitals:   01/17/23 1512 01/17/23 1547  BP: (!) 129/115 (!) 147/98  Pulse: (!) 120 (!) 129  Resp: 20 20  Temp: 97.9 F (36.6 C)   SpO2: 99% 99%    General: Awake, no distress.  CV:  Tachycardic 120. Resp:  Normal effort.  Abd:  No distention.  Other:  He looks markedly dehydrated with dry mucous membranes.  Cracked lips.  Bruising around bilateral eyes, forearms.  He is  moving all extremities full active range of motion he has a soft nontender abdomen and no chest wall tenderness, clear lungs.   ED Results / Procedures / Treatments   Labs (all labs ordered are listed, but only abnormal results are displayed) Labs Reviewed  COMPREHENSIVE METABOLIC PANEL - Abnormal; Notable for the following components:      Result Value   Sodium 127 (*)    Chloride 90 (*)    CO2 19 (*)    Glucose, Bld 208 (*)    Creatinine, Ser 0.59 (*)    Calcium 7.8 (*)    AST 311 (*)    ALT 95 (*)    Total Bilirubin 2.5 (*)    Anion gap 18 (*)    All other components within normal limits  ETHANOL - Abnormal; Notable for the following components:   Alcohol, Ethyl (B) 466 (*)    All other components within normal limits  CBC  URINE DRUG SCREEN, QUALITATIVE ([facility] ONLY)  CK  PROTIME-INR  APTT  TYPE AND SCREEN     I ordered and reviewed the above labs they are notable  for AST and ALT elevated and ratio consistent with alcohol use history. ETOH 466   EKG  ED ECG REPORT I, Pilar Jarvis, the attending physician, personally viewed and interpreted this ECG.   Date: 01/17/2023  EKG Time: 1501  Rate: 129  Rhythm: sinus tachycardia  Axis: nl  Intervals: pvcs, slight qtc prolongation  ST&T Change: no stemi    RADIOLOGY I independently reviewed and interpreted CT of the head see left-sided crescent shaped hyperdensity either motion artifact or representing SDH I also reviewed radiologist's formal read.   PROCEDURES:  Critical Care performed: Yes, see critical care procedure note(s)  .Critical Care  Performed by: Pilar Jarvis, MD Authorized by: Pilar Jarvis, MD   Critical care provider statement:    Critical care time (minutes):  30   Critical care was time spent personally by me on the following activities:  Development of treatment plan with patient or surrogate, discussions with consultants, evaluation of patient's response to treatment, examination of patient,  ordering and review of laboratory studies, ordering and review of radiographic studies, ordering and performing treatments and interventions, pulse oximetry, re-evaluation of patient's condition and review of old charts    MEDICATIONS ORDERED IN ED: Medications  sodium chloride 0.9 % bolus 1,000 mL (has no administration in time range)  senna-docusate (Senokot-S) tablet 1 tablet (has no administration in time range)    External physician / consultants:  I spoke with Dr. Ernestine Mcmurray of neurosurgery regarding care plan for this patient.   IMPRESSION / MDM / ASSESSMENT AND PLAN / ED COURSE  I reviewed the triage vital signs and the nursing notes.                                Patient's presentation is most consistent with acute presentation with potential threat to life or bodily function.  Differential diagnosis includes, but is not limited to, alcohol intoxication, blunt traumatic injury including ICH, C-spine fracture or dislocation, rhabdomyolysis, dehydration electrolyte derangements AKI   The patient is on the cardiac monitor to evaluate for evidence of arrhythmia and/or significant heart rate changes.  MDM:    Multiple falls while intoxicated with a large subdural hematoma with midline shift, immediately consulted with Dr. Ernestine Mcmurray of neurosurgery.  Fortunately his neuroexam aside from being intoxicated with slurred speech is normal as he is answering questions appropriately moving all extremities wakeful cooperative.  No blood thinner use on medical chart review.  Blood pressure initially 129/115 will recheck now and closely monitor as well as closely monitor neuroexam.  Pending neurosurgery recommendations, anticipate admission [facility] at [facility].  -- Neurosurgeon planning for crani today, patient remains awake alert maintaining airway, following commands.  ICU consulted for admission.      FINAL CLINICAL IMPRESSION(S) / ED DIAGNOSES   Final diagnoses:  Subdural  hematoma (HCC)  Alcoholic intoxication without complication (HCC)     Rx / DC Orders   ED Discharge Orders     None        Note:  This document was prepared using Dragon voice recognition software and may include unintentional dictation errors.    Pilar Jarvis, MD 01/17/23 1550    Pilar Jarvis, MD 01/17/23 [phone]

## 2023-01-17 NOTE — ED Notes (Signed)
Transported to Ct by this RN

## 2023-01-17 NOTE — Anesthesia Preprocedure Evaluation (Addendum)
Anesthesia Evaluation  Patient identified by MRN, date of birth, ID band Patient confused  General Assessment Comment:  Patient admitted for alcohol intoxication, numerous falls, resultant large subdural hematoma with midline shift. Patient is visibly inebriated, not in a state to provide proper informed consent. I spoke with his listed next of kin by phone, brother Taejon Rodenberger. Multiple bruises over face and head.  Reviewed: Allergy & Precautions, NPO status , Patient's Chart, lab work & pertinent test results  History of Anesthesia Complications Negative for: history of anesthetic complications  Airway Mallampati: Unable to assess  TM Distance: >3 FB Neck ROM: Full    Dental no notable dental hx. (+) Teeth Intact   Pulmonary neg sleep apnea, neg COPD, Current SmokerPatient did not abstain from smoking.   Pulmonary exam normal breath sounds clear to auscultation       Cardiovascular Exercise Tolerance: Good METShypertension, Pt. on medications (-) CAD and (-) Past MI (-) dysrhythmias  Rhythm:Regular Rate:Normal - Systolic murmurs    Neuro/Psych  PSYCHIATRIC DISORDERS Anxiety Depression    negative neurological ROS     GI/Hepatic ,neg GERD  ,,(+)     substance abuse  alcohol use and cocaine use Chronic heavy alcohol use resulting in frequent falls. Brother also says patient uses cocaine as wel.   Endo/Other  neg diabetes    Renal/GU negative Renal ROS     Musculoskeletal   Abdominal   Peds  Hematology   Anesthesia Other Findings Past Medical History: No date: Alcohol abuse No date: Anxiety 02/10/2015: Atypical mole     Comment:  Right supra auricular scalp. Mild atypia, deep margin               involved.  03/14/2006: Basal cell carcinoma     Comment:  Mid vertex scalp.  03/26/2007: Basal cell carcinoma     Comment:  Vertex scalp.  01/11/2010: Basal cell carcinoma     Comment:  Scalp.  11/05/2013:  Basal cell carcinoma     Comment:  Left anterior deltoid. Superficial. EDC. 10/20/2014: Basal cell carcinoma     Comment:  Left superior pectoral. Nodular. 10/30/2022: Basal cell carcinoma     Comment:  Right vertex scalp. Nodular, infiltrative pattern. Mohs               pending No date: Dyslipidemia 03/06/2017: Dysplastic nevus     Comment:  Right proximal ant. lat. thigh. Mild atypia, limited               margins free. 03/06/2017: Dysplastic nevus     Comment:  Right mid ant. thigh. Mild atypia, limited margins free. 03/24/2019: Dysplastic nevus     Comment:  Left low back paraspinal. Moderate atypia, close to               margin. No date: Monocytosis  Reproductive/Obstetrics                             Anesthesia Physical Anesthesia Plan  ASA: 4 and emergent  Anesthesia Plan: General   Post-op Pain Management: Ofirmev IV (intra-op)*   Induction: Intravenous and Rapid sequence  PONV Risk Score and Plan: 1 and Ondansetron, Dexamethasone and Treatment may vary due to age or medical condition  Airway Management Planned: Oral ETT and Video Laryngoscope Planned  Additional Equipment: Arterial line  Intra-op Plan:   Post-operative Plan: Extubation in OR and Post-operative intubation/ventilation  Informed Consent: I have reviewed the patients  History and Physical, chart, labs and discussed the procedure including the risks, benefits and alternatives for the proposed anesthesia with the patient or authorized representative who has indicated his/her understanding and acceptance.    Discussed DNR with power of attorney.   Dental advisory given  Plan Discussed with: CRNA and Surgeon  Anesthesia Plan Comments: (Discussed risks of anesthesia with patient's brother Clemmon Brooke by phone, due to patient's inebriation and inability to provide informed consent, including PONV, sore throat, lip/dental/eye damage. Rare risks discussed as well, such as  cardiorespiratory and neurological sequelae, and allergic reactions. I discussed the likelihood of continued mechanical ventilation post-procedure and transfer to ICU. Discussed possible arterial line placement. Discussed the role of CRNA in patient's perioperative care.  He was counseled on his brother being higher risk for anesthesia due to comorbidities: brain bleed with midline shift, unknown cocaine use. He was told about increased risk of cardiac and respiratory events, including death.  I did ask whether the patient has ever made any statements about his wishes in the event of a cardiac arrest / end of life. Brother Caryn Bee said that the patient has stated multiple times in the past he would not want chest compressions performed under any circumstance.)        Anesthesia Quick Evaluation

## 2023-01-17 NOTE — Op Note (Signed)
Indications: The patient is a 52yo male who presented with a subdural hematoma.  Due to ongoing brain compression and symptoms, surgical intervention was recommended.   Findings: subdural hematoma  Preoperative Diagnosis: subdural hematoma Postoperative Diagnosis: same   EBL: 500 ml IVF: See Report Drains: 1 Epidural Drain to bulb suction Disposition: Intubated to ICU Complications: none  A foley catheter was placed.   Preoperative Note: Plan was discussed with the patient as well as his brother given the patient's level of intoxication.  We discussed need for reoperation given the large size of his subdural hematoma and the nature of subdural hematomas.  Risks of surgery discussed include: infection, bleeding, stroke, coma, death, paralysis, CSF leak, nerve/spinal cord injury, numbness, tingling, weakness, vascular injury, need for further surgery, persistent symptoms, and the risks of anesthesia. The patient understood these risks and agreed to proceed.  NAME OF PROCEDURE:               1.  Left frontoparietal craniotomy for evacuation of hematoma   PROCEDURE:  Patient was brought to the operating room, intubated. The mayfield pins were applied.  The patient was then positioned for a left-sided frontoparietal craniotomy.    The incision was planned, then prepped and draped in standard fashion.  The incision was opened sharply, then the galea opened.  A retractor was placed.  The temporalis muscle was divided, then the periosteal used to reflect the muscle.   A frontotemporoparietal craniotomy was then fashioned with the burr and craniotome.  The dura was identified, then opened sharply.  A subdural hematoma was identified.  The acute subdural was then removed using irrigation and suction.  After removal, the intradural space was inspected and hemostasis achieved.    After hemostasis was achieved, we turned attention to closure.  The dura was approximated.  The craniotomy site was  checked and a fixation plate used to reconstruct the skull.  A epidural drain was placed the temporalis and galea were closed.  Staples were used on the skin. A sterile dressing was placed.    Needle, lap and all counts were correct at the end of the case.    I performed this procedure without a assistant surgeon  Lovenia Kim, MD

## 2023-01-17 NOTE — Progress Notes (Signed)
Post-Operative Mechanical Ventilatory Support - Ventilator settings: PRVC  8 mL/kg, 40% FiO2, 5 PEEP, continue ventilator support & lung protective strategies - Wean PEEP & FiO2 as tolerated, maintain SpO2 > 90% - VAP protocol in place - Plateau pressures less than 30 cm H20  - Intermittent chest x-ray & ABG PRN - Daily WUA with SBT as tolerated  - Ensure adequate pulmonary hygiene  - F/u cultures, trend PCT - bronchodilators PRN - PAD protocol in place: Fentanyl drip & Propofol drip - continue peripheral phenylephrine PRN to maintain MAP > 65, SBP > 90  Subdural Hematoma s/p emergent Left frontoparietal craniotomy  - follow up post procedure labs: BMP, CBC, lactic, PCT - neurosurgery following, appreciate input > case discussed post procedure, recommendations below - HOB flat overnight - repeat CT head in AM - maintain SBP < 160, labetalol Q 2 PRN ordered - Neuro checks Q 2 h - Keppra 500 mg BID - monitor epidural drain, document output   Betsey Holiday, AGACNP-BC Acute Care Nurse Practitioner  Pulmonary & Critical Care   [phone] / [phone] Please see Amion for details.

## 2023-01-17 NOTE — ED Notes (Addendum)
EDP made aware of pt's BP >160 systolic. EDP advising if repeat BP systolic <160 will administer IV medication

## 2023-01-17 NOTE — ED Notes (Signed)
Safety sitter 1:1 with pt for pt safety.

## 2023-01-18 ENCOUNTER — Other Ambulatory Visit: Payer: Self-pay | Admitting: Internal Medicine

## 2023-01-18 ENCOUNTER — Inpatient Hospital Stay: Payer: BC Managed Care – PPO

## 2023-01-18 LAB — PREPARE PLATELET PHERESIS: Unit division: 0

## 2023-01-18 LAB — TRIGLYCERIDES: Triglycerides: 424 mg/dL — ABNORMAL HIGH (ref <150)

## 2023-01-18 LAB — COMPREHENSIVE METABOLIC PANEL
ALT: 76 U/L — ABNORMAL HIGH (ref 0–44)
AST: 203 U/L — ABNORMAL HIGH (ref 15–41)
Albumin: 3 g/dL — ABNORMAL LOW (ref 3.5–5.0)
Alkaline Phosphatase: 42 U/L (ref 38–126)
Anion gap: 13 (ref 5–15)
BUN: 12 mg/dL (ref 6–20)
CO2: 21 mmol/L — ABNORMAL LOW (ref 22–32)
Calcium: 7 mg/dL — ABNORMAL LOW (ref 8.9–10.3)
Chloride: 96 mmol/L — ABNORMAL LOW (ref 98–111)
Creatinine, Ser: 0.49 mg/dL — ABNORMAL LOW (ref 0.61–1.24)
GFR, Estimated: >60 mL/min (ref >60)
Glucose, Bld: 176 mg/dL — ABNORMAL HIGH (ref 70–99)
Potassium: 3.7 mmol/L (ref 3.5–5.1)
Sodium: 130 mmol/L — ABNORMAL LOW (ref 135–145)
Total Bilirubin: 1.4 mg/dL — ABNORMAL HIGH (ref <1.2)
Total Protein: 5.4 g/dL — ABNORMAL LOW (ref 6.5–8.1)

## 2023-01-18 LAB — GLUCOSE, CAPILLARY
Glucose-Capillary: 143 mg/dL — ABNORMAL HIGH (ref 70–99)
Glucose-Capillary: 151 mg/dL — ABNORMAL HIGH (ref 70–99)
Glucose-Capillary: 163 mg/dL — ABNORMAL HIGH (ref 70–99)
Glucose-Capillary: 165 mg/dL — ABNORMAL HIGH (ref 70–99)
Glucose-Capillary: 169 mg/dL — ABNORMAL HIGH (ref 70–99)
Glucose-Capillary: 80 mg/dL (ref 70–99)

## 2023-01-18 LAB — PHOSPHORUS: Phosphorus: 2.2 mg/dL — ABNORMAL LOW (ref 2.5–4.6)

## 2023-01-18 LAB — BPAM PLATELET PHERESIS
Blood Product Expiration Date: 202411212359
Unit Type and Rh: 6200

## 2023-01-18 LAB — HIV ANTIBODY (ROUTINE TESTING W REFLEX): HIV Screen 4th Generation wRfx: NONREACTIVE

## 2023-01-18 LAB — CBC
HCT: 27.5 % — ABNORMAL LOW (ref 39.0–52.0)
Hemoglobin: 10.3 g/dL — ABNORMAL LOW (ref 13.0–17.0)
MCH: 35.2 pg — ABNORMAL HIGH (ref 26.0–34.0)
MCHC: 37.5 g/dL — ABNORMAL HIGH (ref 30.0–36.0)
MCV: 93.9 fL (ref 80.0–100.0)
Platelets: 106 10*3/uL — ABNORMAL LOW (ref 150–400)
RBC: 2.93 MIL/uL — ABNORMAL LOW (ref 4.22–5.81)
RDW: 12.5 % (ref 11.5–15.5)
WBC: 13.4 10*3/uL — ABNORMAL HIGH (ref 4.0–10.5)
nRBC: 0.3 % — ABNORMAL HIGH (ref 0.0–0.2)

## 2023-01-18 LAB — HEMOGLOBIN A1C
Hgb A1c MFr Bld: 5.7 % — ABNORMAL HIGH (ref 4.8–5.6)
Mean Plasma Glucose: 116.89 mg/dL

## 2023-01-18 LAB — PROTIME-INR
INR: 1.2 (ref 0.8–1.2)
Prothrombin Time: 15 s (ref 11.4–15.2)

## 2023-01-18 LAB — MAGNESIUM: Magnesium: 1.9 mg/dL (ref 1.7–2.4)

## 2023-01-18 LAB — CK: Total CK: 7420 U/L — ABNORMAL HIGH (ref 49–397)

## 2023-01-18 LAB — LACTIC ACID, PLASMA: Lactic Acid, Venous: 2.1 mmol/L (ref 0.5–1.9)

## 2023-01-18 MED ORDER — CHLORDIAZEPOXIDE HCL 5 MG PO CAPS
25.0000 mg | ORAL_CAPSULE | Freq: Four times a day (QID) | ORAL | Status: DC | PRN
  Administered 2023-01-19: 25 mg via ORAL
  Filled 2023-01-18: qty 5

## 2023-01-18 MED ORDER — CHLORDIAZEPOXIDE HCL 5 MG PO CAPS: 25.0000 mg | ORAL_CAPSULE | ORAL | Status: DC

## 2023-01-18 MED ORDER — ADULT MULTIVITAMIN W/MINERALS CH: 1.0000 | ORAL_TABLET | Freq: Every day | ORAL | Status: DC

## 2023-01-18 MED ORDER — CHLORDIAZEPOXIDE HCL 5 MG PO CAPS
25.0000 mg | ORAL_CAPSULE | Freq: Four times a day (QID) | ORAL | Status: AC
Start: 2023-01-18 — End: 2023-01-20
  Administered 2023-01-18 – 2023-01-19 (×5): 25 mg via ORAL
  Filled 2023-01-18 (×5): qty 5

## 2023-01-18 MED ORDER — LOPERAMIDE HCL 2 MG PO CAPS: 2.0000 mg | ORAL_CAPSULE | ORAL | Status: DC | PRN

## 2023-01-18 MED ORDER — CHLORDIAZEPOXIDE HCL 5 MG PO CAPS: 25.0000 mg | ORAL_CAPSULE | Freq: Three times a day (TID) | ORAL | Status: DC

## 2023-01-18 MED ORDER — CHLORDIAZEPOXIDE HCL 5 MG PO CAPS: 25.0000 mg | ORAL_CAPSULE | Freq: Once | ORAL | Status: DC

## 2023-01-18 MED ORDER — CHLORDIAZEPOXIDE HCL 5 MG PO CAPS: 25.0000 mg | ORAL_CAPSULE | Freq: Every day | ORAL | Status: DC

## 2023-01-18 MED ORDER — CHLORDIAZEPOXIDE HCL 5 MG PO CAPS
25.0000 mg | ORAL_CAPSULE | Freq: Every day | ORAL | Status: DC
Start: 2023-01-22 — End: 2023-01-18

## 2023-01-18 MED ORDER — MIDAZOLAM HCL 2 MG/2ML IJ SOLN: 2.0000 mg | Freq: Once | INTRAMUSCULAR | Status: AC

## 2023-01-18 MED ORDER — CHLORHEXIDINE GLUCONATE CLOTH 2 % EX PADS
6.0000 | MEDICATED_PAD | Freq: Every day | CUTANEOUS | Status: DC
  Administered 2023-01-18 – 2023-01-24 (×7): 6 via TOPICAL

## 2023-01-18 MED ORDER — CHLORDIAZEPOXIDE HCL 5 MG PO CAPS
25.0000 mg | ORAL_CAPSULE | Freq: Three times a day (TID) | ORAL | Status: DC
  Filled 2023-01-18: qty 5

## 2023-01-18 MED ORDER — CHLORDIAZEPOXIDE HCL 5 MG PO CAPS: 25.0000 mg | ORAL_CAPSULE | Freq: Four times a day (QID) | ORAL | Status: DC | PRN

## 2023-01-18 MED ORDER — PANTOPRAZOLE SODIUM 40 MG IV SOLR
40.0000 mg | INTRAVENOUS | Status: DC
  Administered 2023-01-18 – 2023-01-21 (×4): 40 mg via INTRAVENOUS
  Filled 2023-01-18 (×4): qty 10

## 2023-01-18 MED ORDER — MIDAZOLAM HCL 2 MG/2ML IJ SOLN
INTRAMUSCULAR | Status: AC
  Administered 2023-01-18: 2 mg via INTRAVENOUS
  Filled 2023-01-18: qty 2

## 2023-01-18 MED ORDER — ADULT MULTIVITAMIN W/MINERALS CH
1.0000 | ORAL_TABLET | Freq: Every day | ORAL | Status: DC
  Administered 2023-01-19 – 2023-02-04 (×17): 1 via ORAL
  Filled 2023-01-18 (×18): qty 1

## 2023-01-18 MED ORDER — FOLIC ACID 1 MG PO TABS
1.0000 mg | ORAL_TABLET | Freq: Every day | ORAL | Status: DC
  Administered 2023-01-19 – 2023-02-04 (×17): 1 mg via ORAL
  Filled 2023-01-18 (×17): qty 1

## 2023-01-18 MED ORDER — CHLORDIAZEPOXIDE HCL 5 MG PO CAPS
25.0000 mg | ORAL_CAPSULE | ORAL | Status: DC
Start: 2023-01-21 — End: 2023-01-20

## 2023-01-18 MED ORDER — LACTATED RINGERS IV BOLUS
1000.0000 mL | Freq: Once | INTRAVENOUS | Status: AC
  Administered 2023-01-18: 1000 mL via INTRAVENOUS

## 2023-01-18 MED ORDER — CHLORDIAZEPOXIDE HCL 5 MG PO CAPS
25.0000 mg | ORAL_CAPSULE | Freq: Four times a day (QID) | ORAL | Status: DC
  Administered 2023-01-18: 25 mg
  Filled 2023-01-18: qty 5

## 2023-01-18 MED ORDER — DEXMEDETOMIDINE HCL IN NACL 400 MCG/100ML IV SOLN
0.0000 ug/kg/h | INTRAVENOUS | Status: DC
  Administered 2023-01-18: 0.4 ug/kg/h via INTRAVENOUS
  Administered 2023-01-19: 0.8 ug/kg/h via INTRAVENOUS
  Administered 2023-01-19: 0.5 ug/kg/h via INTRAVENOUS
  Administered 2023-01-19: 0.8 ug/kg/h via INTRAVENOUS
  Administered 2023-01-20 (×2): 0.9 ug/kg/h via INTRAVENOUS
  Filled 2023-01-18 (×6): qty 100

## 2023-01-18 MED ORDER — BLISTEX MEDICATED EX OINT: TOPICAL_OINTMENT | CUTANEOUS | Status: DC | PRN

## 2023-01-18 NOTE — Progress Notes (Incomplete)
Attending Progress Note  History: Samuel Riley is here for a large left-sided subdural hematoma, taken to the OR on 01/17/2023 for decompressive hemicraniotomy and evacuation of his subdural hematoma.  Admitted to the intensive care unit.  Physical Exam: Vitals:   01/18/23 0752 01/18/23 0800  BP:  (!) 150/96  Pulse: (!) 117 (!) 117  Resp: 12 13  Temp: 100 F (37.8 C) 100 F (37.8 C)  SpO2: 98% 98%    AA Ox3 CNI  Strength:5/5 throughout ***  Data:  Recent Labs  Lab 01/17/23 1507 01/17/23 2153 01/18/23 0229  NA 127* 129* 130*  K 3.9 4.0 3.7  CL 90* 95* 96*  CO2 19* 19* 21*  BUN 15 13 12   CREATININE 0.59* 0.57* 0.49*  GLUCOSE 208* 189* 176*  CALCIUM 7.8* 7.1* 7.0*   Recent Labs  Lab 01/18/23 0229  AST 203*  ALT 76*  ALKPHOS 42     Recent Labs  Lab 01/17/23 1507 01/17/23 2153 01/18/23 0229  WBC 13.1* 13.2* 13.4*  HGB 14.1 11.4* 10.3*  HCT RESULTS UNAVAILABLE DUE TO INTERFERING SUBSTANCE RESULTS UNAVAILABLE DUE TO INTERFERING SUBSTANCE 27.5*  PLT 143* 125* 106*   Recent Labs  Lab 01/17/23 1550 01/18/23 0229  APTT 23*  --   INR 1.0 1.2         Other tests/results:  Narrative & Impression  CLINICAL DATA:  52 year old male status post fall with head trauma. Left subdural hematoma. Postoperative day 1 left side craniotomy and hematoma evacuation.   EXAM: CT HEAD WITHOUT CONTRAST   TECHNIQUE: Contiguous axial images were obtained from the base of the skull through the vertex without intravenous contrast.   RADIATION DOSE REDUCTION: This exam was performed according to the departmental dose-optimization program which includes automated exposure control, adjustment of the mA and/or kV according to patient size and/or use of iterative reconstruction technique.   COMPARISON:  Preoperative CT yesterday.   FINDINGS: Brain: Left subdural drain now in place. Decreased left side subdural hematoma. Largely hyperdense residual blood products  plus some postoperative pneumocephalus measuring 5-6 mm thickness at most levels, but up to 11 mm along the left anterior frontal convexity (decreased from 16 mm at the same level preoperatively. Small volume of para falcine and left tentorial blood also.   Unresolved mass effect on the brain. Rightward midline shift is 7-8 mm now versus 15 mm preoperatively. Ventricle size and configuration not significantly changed.   Small volume of layering intraventricular blood now. And trace right hemisphere subarachnoid blood also (sagittal image 24). Possible small hemorrhagic contusion in the contralateral right temporal lobe on coronal image 38. Alternatively this could be trace right hemisphere extra-axial hemorrhage, regardless is stable.   Basilar cisterns remain patent. No cortically based acute infarct identified.   Vascular: Calcified atherosclerosis at the skull base. No suspicious intracranial vascular hyperdensity.   Skull: New left side craniotomy.  Otherwise stable and intact.   Sinuses/Orbits: Scattered paranasal sinus mucosal thickening and polypoid opacity but Visualized paranasal sinuses and mastoids are stable and well aerated. There is an anterior nasal septal defect.   Other: Postoperative changes to the scalp now superimposed on anterior forehead scalp hematoma. Ongoing periorbital soft tissue swelling. Globes and intraorbital soft tissues appear to remain intact.   IMPRESSION: 1. Decreased left side subdural hematoma on postoperative day 1. Residual hyperdense SDH with some pneumocephalus mostly ranges from 6-11 mm thickness. Small volume intraventricular and right hemisphere subarachnoid hemorrhage now. And tiny right temporal lobe region hemorrhagic contusion versus  extra-axial blood, stable.   2. Decreased Rightward midline shift, now 7-8 mm. No ventriculomegaly. Basilar cisterns remain patent.     Electronically Signed   By: Odessa Fleming M.D.   On:  01/18/2023 05:44    Assessment/Plan:  [name] ***  - mobilize - pain control - DVT prophylaxis ***- PTOT  Lovenia Kim, MD/MSCR Department of Neurosurgery
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
## 2023-01-18 NOTE — Consult Note (Addendum)
PHARMACY CONSULT NOTE - ELECTROLYTES  Pharmacy Consult for Electrolyte Monitoring and Replacement   Recent Labs: Potassium (mmol/L)  Date Value  01/18/2023 3.7   Magnesium (mg/dL)  Date Value  96/29/5284 1.9   Calcium (mg/dL)  Date Value  13/24/4010 7.0 (L)   Albumin (g/dL)  Date Value  27/25/3664 3.0 (L)   Phosphorus (mg/dL)  Date Value  40/34/7425 2.2 (L)   Sodium (mmol/L)  Date Value  01/18/2023 130 (L)    Height: 5\' 8"  (172.7 cm) Weight: 72.9 kg (160 lb 11.5 oz) IBW/kg (Calculated) : 68.4 Estimated Creatinine Clearance: 103.3 mL/min (A) (by C-G formula based on SCr of 0.49 mg/dL (L)).  Assessment  Samuel Riley is a 52 y.o. male presenting with traumatic SDH w/ midline shift now s/p craniotomy w/ evacuation. PMH significant for alcohol use disorder. Pharmacy has been consulted to monitor and replace electrolytes.  CK 14146 >> 7420, TG 424  Diet: NPO MIVF: NS @ 100 mL/hr set to expire at 1800 on 11/22 Pertinent medications: N/A  Goal of Therapy: Electrolytes within normal limits  Plan:  Na 130 on NS at 100 cc/hr. Avoid hyponatremia and hypotonic fluids in setting of SDH No electrolyte replacement indicated at this time, phos 2.2 and may consider replacement if lower tomorrow Follow-up electrolytes with AM labs tomorrow  Thank you for allowing pharmacy to be a part of this patient's care.  Tressie Ellis 01/18/2023 2:33 PM

## 2023-01-18 NOTE — Progress Notes (Signed)
EEG complete - results pending 

## 2023-01-18 NOTE — Progress Notes (Signed)
Patient had ultra sound at bedside completed.  This nurse went in to assess patient post test.  This nurse did find patient to be less responsive.  Pupils 1 and sluggish bilateral.   Patient no longer responding to voice or pain. Samuel Riley made aware.  EEG ordered. Propofol decreased by 50% to assess neurological status.

## 2023-01-18 NOTE — Progress Notes (Signed)
Attempted to roll patient and assess skin.  Patient woke and reaching up for tube and pushing staff away.  10:00 Versed 2 mg IV given.  10:15 patient still not allowing staff to do mouth care or turn.  One time order for versed 2 mg obtained from Guy Begin, NP and was given. 10:30 basic care given and tolerated by patient.

## 2023-01-18 NOTE — Progress Notes (Signed)
Pt extubated per MD order
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
## 2023-01-19 ENCOUNTER — Inpatient Hospital Stay: Payer: BC Managed Care – PPO

## 2023-01-19 LAB — COMPREHENSIVE METABOLIC PANEL
ALT: 79 U/L — ABNORMAL HIGH (ref 0–44)
AST: 191 U/L — ABNORMAL HIGH (ref 15–41)
Albumin: 2.7 g/dL — ABNORMAL LOW (ref 3.5–5.0)
Alkaline Phosphatase: 49 U/L (ref 38–126)
Anion gap: 9 (ref 5–15)
BUN: 12 mg/dL (ref 6–20)
CO2: 28 mmol/L (ref 22–32)
Calcium: 7.3 mg/dL — ABNORMAL LOW (ref 8.9–10.3)
Chloride: 93 mmol/L — ABNORMAL LOW (ref 98–111)
Creatinine, Ser: 0.58 mg/dL — ABNORMAL LOW (ref 0.61–1.24)
GFR, Estimated: >60 mL/min (ref >60)
Glucose, Bld: 103 mg/dL — ABNORMAL HIGH (ref 70–99)
Potassium: 3 mmol/L — ABNORMAL LOW (ref 3.5–5.1)
Sodium: 130 mmol/L — ABNORMAL LOW (ref 135–145)
Total Bilirubin: 1.8 mg/dL — ABNORMAL HIGH (ref <1.2)
Total Protein: 4.7 g/dL — ABNORMAL LOW (ref 6.5–8.1)

## 2023-01-19 LAB — PROTIME-INR
INR: 1 (ref 0.8–1.2)
Prothrombin Time: 13.7 s (ref 11.4–15.2)

## 2023-01-19 LAB — GLUCOSE, CAPILLARY
Glucose-Capillary: 104 mg/dL — ABNORMAL HIGH (ref 70–99)
Glucose-Capillary: 106 mg/dL — ABNORMAL HIGH (ref 70–99)
Glucose-Capillary: 118 mg/dL — ABNORMAL HIGH (ref 70–99)
Glucose-Capillary: 145 mg/dL — ABNORMAL HIGH (ref 70–99)
Glucose-Capillary: 174 mg/dL — ABNORMAL HIGH (ref 70–99)
Glucose-Capillary: 191 mg/dL — ABNORMAL HIGH (ref 70–99)

## 2023-01-19 LAB — CBC
HCT: 23.8 % — ABNORMAL LOW (ref 39.0–52.0)
Hemoglobin: 8.9 g/dL — ABNORMAL LOW (ref 13.0–17.0)
MCH: 35.3 pg — ABNORMAL HIGH (ref 26.0–34.0)
MCHC: 37.4 g/dL — ABNORMAL HIGH (ref 30.0–36.0)
MCV: 94.4 fL (ref 80.0–100.0)
Platelets: 93 10*3/uL — ABNORMAL LOW (ref 150–400)
RBC: 2.52 MIL/uL — ABNORMAL LOW (ref 4.22–5.81)
RDW: 12.1 % (ref 11.5–15.5)
WBC: 14.6 10*3/uL — ABNORMAL HIGH (ref 4.0–10.5)
nRBC: 3.6 % — ABNORMAL HIGH (ref 0.0–0.2)

## 2023-01-19 LAB — MAGNESIUM: Magnesium: 2 mg/dL (ref 1.7–2.4)

## 2023-01-19 LAB — VITAMIN D 25 HYDROXY (VIT D DEFICIENCY, FRACTURES): Vit D, 25-Hydroxy: 25.57 ng/mL — ABNORMAL LOW (ref 30–100)

## 2023-01-19 LAB — VITAMIN B12: Vitamin B-12: 556 pg/mL (ref 180–914)

## 2023-01-19 LAB — PHOSPHORUS: Phosphorus: 1.3 mg/dL — ABNORMAL LOW (ref 2.5–4.6)

## 2023-01-19 LAB — FOLATE: Folate: 7 ng/mL (ref >5.9)

## 2023-01-19 MED ORDER — FENTANYL CITRATE PF 50 MCG/ML IJ SOSY
25.0000 ug | PREFILLED_SYRINGE | INTRAMUSCULAR | Status: DC | PRN
  Administered 2023-01-20: 25 ug via INTRAVENOUS
  Filled 2023-01-19: qty 1

## 2023-01-19 MED ORDER — SODIUM CHLORIDE 0.9 % IV SOLN
INTRAVENOUS | Status: AC
Start: 2023-01-19 — End: 2023-01-20

## 2023-01-19 MED ORDER — SPIRITUS FRUMENTI
2.0000 | Freq: Two times a day (BID) | ORAL | Status: DC
  Administered 2023-01-19 (×2): 2 via ORAL
  Filled 2023-01-19 (×3): qty 2

## 2023-01-19 MED ORDER — DEXMEDETOMIDINE HCL IN NACL 400 MCG/100ML IV SOLN: 0.0000 ug/kg/h | INTRAVENOUS | Status: DC

## 2023-01-19 MED ORDER — ONDANSETRON HCL 4 MG/2ML IJ SOLN
4.0000 mg | Freq: Four times a day (QID) | INTRAMUSCULAR | Status: DC | PRN
  Administered 2023-01-19: 4 mg via INTRAVENOUS
  Filled 2023-01-19: qty 2

## 2023-01-19 MED ORDER — DEXTROSE 5 % IV SOLN
30.0000 mmol | Freq: Once | INTRAVENOUS | Status: AC
  Administered 2023-01-19: 30 mmol via INTRAVENOUS
  Filled 2023-01-19: qty 10

## 2023-01-19 MED ORDER — ACETAMINOPHEN 325 MG PO TABS
650.0000 mg | ORAL_TABLET | Freq: Four times a day (QID) | ORAL | Status: AC | PRN
  Administered 2023-01-19 – 2023-01-20 (×3): 650 mg via ORAL
  Filled 2023-01-19 (×3): qty 2

## 2023-01-19 NOTE — Progress Notes (Signed)
Neurosurgery Progress Note  History: Samuel Riley s/p craniotomy for SDH s/p trauma  Doing well.  Denies any pain but with significant confusion  Physical Exam: Vitals:   01/19/23 0700 01/19/23 0704  BP: 112/72   Pulse: 82 75  Resp:    Temp: 99.5 F (37.5 C) 99.3 F (37.4 C)  SpO2: 100% 100%    Aox self only Bright Moderate confusion Regards examiner Answers to only simple questioning Without drift Resists examiner with good, symmetric strength  JP: bloody (60 in 24 hours)  Assessment/Plan:  Samuel Riley POD 1 s/p left craniotomy for subdural hematoma  -likely dc JP tomorrow -mobilize as tolerated -pain control  Erenest Rasher, MD Neurosurgery

## 2023-01-19 NOTE — Consult Note (Signed)
PHARMACY CONSULT NOTE - ELECTROLYTES  Pharmacy Consult for Electrolyte Monitoring and Replacement   Recent Labs: Potassium (mmol/L)  Date Value  01/19/2023 3.0 (L)   Magnesium (mg/dL)  Date Value  46/96/2952 2.0   Calcium (mg/dL)  Date Value  84/13/2440 7.3 (L)   Albumin (g/dL)  Date Value  12/23/2534 2.7 (L)   Phosphorus (mg/dL)  Date Value  64/40/3474 1.3 (L)   Sodium (mmol/L)  Date Value  01/19/2023 130 (L)    Height: 5\' 8"  (172.7 cm) Weight: 73 kg (160 lb 15 oz) IBW/kg (Calculated) : 68.4 Estimated Creatinine Clearance: 103.3 mL/min (A) (by C-G formula based on SCr of 0.58 mg/dL (L)).  Assessment  Samuel Riley is a 52 y.o. male presenting with traumatic SDH w/ midline shift now s/p craniotomy w/ evacuation. PMH significant for alcohol use disorder. Pharmacy has been consulted to monitor and replace electrolytes.  CK 14146 >> 7420, TG 424  Diet: NPO MIVF: NS @ 100 mL/hr set to expire at 1800 on 11/22 Pertinent medications: N/A  Goal of Therapy: Electrolytes within normal limits  Plan:  Na 130 on NS at 100 cc/hr. Avoid hyponatremia and hypotonic fluids in setting of SDH, resuming NS@100ml /hr K 3.0, Phos 1.3: Kphos IV x1  Follow-up electrolytes with AM labs tomorrow  Thank you for allowing pharmacy to be a part of this patient's care.  Harrell Lark A Amayrani Bennick 01/19/2023 7:41 AM

## 2023-01-19 NOTE — Anesthesia Postprocedure Evaluation (Signed)
Anesthesia Post Note  Patient: Samuel Riley  Procedure(s) Performed: CRANIOTOMY HEMATOMA EVACUATION SUBDURAL (Left: Head)  Patient location during evaluation: SICU Anesthesia Type: General Level of consciousness: awake and alert Pain management: pain level controlled Vital Signs Assessment: post-procedure vital signs reviewed and stable Respiratory status: spontaneous breathing Cardiovascular status: stable Postop Assessment: no apparent nausea or vomiting Anesthetic complications: no   No notable events documented.   Last Vitals:  Vitals:   01/19/23 0700 01/19/23 0704  BP: 112/72   Pulse: 82 75  Resp:    Temp: 37.5 C 37.4 C  SpO2: 100% 100%    Last Pain:  Vitals:   01/19/23 0732  TempSrc: Bladder  PainSc:                  Foye Deer
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
## 2023-01-19 NOTE — Progress Notes (Signed)
Sitter at bedside.

## 2023-01-20 DIAGNOSIS — R4182 Altered mental status, unspecified: Secondary | ICD-10-CM | POA: Diagnosis not present

## 2023-01-20 LAB — COMPREHENSIVE METABOLIC PANEL
ALT: 90 U/L — ABNORMAL HIGH (ref 0–44)
AST: 177 U/L — ABNORMAL HIGH (ref 15–41)
Albumin: 2.6 g/dL — ABNORMAL LOW (ref 3.5–5.0)
Alkaline Phosphatase: 47 U/L (ref 38–126)
Anion gap: 7 (ref 5–15)
BUN: 6 mg/dL (ref 6–20)
CO2: 31 mmol/L (ref 22–32)
Calcium: 7 mg/dL — ABNORMAL LOW (ref 8.9–10.3)
Chloride: 88 mmol/L — ABNORMAL LOW (ref 98–111)
Creatinine, Ser: 0.61 mg/dL (ref 0.61–1.24)
GFR, Estimated: >60 mL/min (ref >60)
Glucose, Bld: 116 mg/dL — ABNORMAL HIGH (ref 70–99)
Potassium: 2.5 mmol/L — CL (ref 3.5–5.1)
Sodium: 126 mmol/L — ABNORMAL LOW (ref 135–145)
Total Bilirubin: 1.3 mg/dL — ABNORMAL HIGH (ref <1.2)
Total Protein: 4.6 g/dL — ABNORMAL LOW (ref 6.5–8.1)

## 2023-01-20 LAB — BASIC METABOLIC PANEL
Anion gap: 9 (ref 5–15)
BUN: 6 mg/dL (ref 6–20)
CO2: 27 mmol/L (ref 22–32)
Calcium: 7.3 mg/dL — ABNORMAL LOW (ref 8.9–10.3)
Chloride: 87 mmol/L — ABNORMAL LOW (ref 98–111)
Creatinine, Ser: 0.56 mg/dL — ABNORMAL LOW (ref 0.61–1.24)
GFR, Estimated: >60 mL/min (ref >60)
Glucose, Bld: 141 mg/dL — ABNORMAL HIGH (ref 70–99)
Potassium: 2.9 mmol/L — ABNORMAL LOW (ref 3.5–5.1)
Sodium: 123 mmol/L — ABNORMAL LOW (ref 135–145)

## 2023-01-20 LAB — GLUCOSE, CAPILLARY
Glucose-Capillary: 135 mg/dL — ABNORMAL HIGH (ref 70–99)
Glucose-Capillary: 133 mg/dL — ABNORMAL HIGH (ref 70–99)
Glucose-Capillary: 171 mg/dL — ABNORMAL HIGH (ref 70–99)
Glucose-Capillary: 107 mg/dL — ABNORMAL HIGH (ref 70–99)
Glucose-Capillary: 124 mg/dL — ABNORMAL HIGH (ref 70–99)
Glucose-Capillary: 102 mg/dL — ABNORMAL HIGH (ref 70–99)

## 2023-01-20 LAB — PHOSPHORUS
Phosphorus: 1.4 mg/dL — ABNORMAL LOW (ref 2.5–4.6)
Phosphorus: 2.5 mg/dL (ref 2.5–4.6)

## 2023-01-20 LAB — MAGNESIUM
Magnesium: 1.6 mg/dL — ABNORMAL LOW (ref 1.7–2.4)
Magnesium: 2 mg/dL (ref 1.7–2.4)

## 2023-01-20 LAB — PROTIME-INR
INR: 1.1 (ref 0.8–1.2)
Prothrombin Time: 14.1 s (ref 11.4–15.2)

## 2023-01-20 MED ORDER — POTASSIUM CHLORIDE 10 MEQ/100ML IV SOLN
10.0000 meq | INTRAVENOUS | Status: DC
  Filled 2023-01-20 (×8): qty 100

## 2023-01-20 MED ORDER — CALCIUM GLUCONATE-NACL 1-0.675 GM/50ML-% IV SOLN
1.0000 g | Freq: Once | INTRAVENOUS | Status: AC
  Administered 2023-01-20: 1000 mg via INTRAVENOUS
  Filled 2023-01-20: qty 50

## 2023-01-20 MED ORDER — MAGNESIUM SULFATE 2 GM/50ML IV SOLN
2.0000 g | Freq: Once | INTRAVENOUS | Status: AC
Start: 2023-01-20 — End: 2023-01-20
  Administered 2023-01-20: 2 g via INTRAVENOUS
  Filled 2023-01-20: qty 50

## 2023-01-20 MED ORDER — POTASSIUM CHLORIDE 20 MEQ PO PACK
40.0000 meq | PACK | ORAL | Status: AC
  Administered 2023-01-20 (×2): 40 meq via ORAL
  Filled 2023-01-20 (×2): qty 2

## 2023-01-20 MED ORDER — CHLORPROMAZINE HCL 50 MG PO TABS
50.0000 mg | ORAL_TABLET | Freq: Three times a day (TID) | ORAL | Status: DC | PRN
  Administered 2023-01-20 – 2023-01-23 (×2): 50 mg via ORAL
  Filled 2023-01-20 (×3): qty 1

## 2023-01-20 MED ORDER — LORAZEPAM 1 MG PO TABS
1.0000 mg | ORAL_TABLET | ORAL | Status: AC | PRN
  Administered 2023-01-20 – 2023-01-23 (×4): 2 mg via ORAL
  Filled 2023-01-20 (×4): qty 2

## 2023-01-20 MED ORDER — LEVETIRACETAM IN NACL 500 MG/100ML IV SOLN
500.0000 mg | INTRAVENOUS | Status: DC
  Administered 2023-01-20 – 2023-01-21 (×2): 500 mg via INTRAVENOUS
  Filled 2023-01-20 (×2): qty 100

## 2023-01-20 MED ORDER — CHLORDIAZEPOXIDE HCL 5 MG PO CAPS
10.0000 mg | ORAL_CAPSULE | Freq: Three times a day (TID) | ORAL | Status: DC
  Administered 2023-01-20 – 2023-01-21 (×6): 10 mg via ORAL
  Filled 2023-01-20 (×5): qty 2

## 2023-01-20 MED ORDER — POTASSIUM CHLORIDE CRYS ER 20 MEQ PO TBCR
40.0000 meq | EXTENDED_RELEASE_TABLET | ORAL | Status: AC
  Administered 2023-01-20 (×2): 40 meq via ORAL
  Filled 2023-01-20 (×2): qty 2

## 2023-01-20 MED ORDER — DIPHENHYDRAMINE HCL 50 MG/ML IJ SOLN
50.0000 mg | Freq: Two times a day (BID) | INTRAMUSCULAR | Status: DC
  Administered 2023-01-20 – 2023-01-21 (×4): 50 mg via INTRAVENOUS
  Filled 2023-01-20 (×5): qty 1

## 2023-01-20 MED ORDER — POTASSIUM PHOSPHATES 15 MMOLE/5ML IV SOLN
45.0000 mmol | Freq: Once | INTRAVENOUS | Status: AC
  Administered 2023-01-20: 45 mmol via INTRAVENOUS
  Filled 2023-01-20: qty 15

## 2023-01-20 NOTE — Progress Notes (Signed)
Neurosurgery Progress Note  History: Samuel Riley s/p craniotomy for SDH s/p trauma  Doing well.  Denies any pain but with significant confusion  Physical Exam: Vitals:   01/20/23 0800 01/20/23 0900  BP: (!) 109/59 (!) 92/55  Pulse: 82 96  Resp: 18 16  Temp:    SpO2: (!) 88% 90%    Aox self only Bright Moderate confusion Regards examiner Answers to only simple questioning Without drift Resists examiner with good, symmetric strength  Assessment/Plan:  Samuel Riley POD 2 s/p left craniotomy for subdural hematoma  -JP removed -mobilize as tolerated -pain control  Erenest Rasher, MD Neurosurgery

## 2023-01-20 NOTE — Group Note (Deleted)
Date:  01/20/2023 Time:  11:14 AM  Group Topic/Focus:  Coping With Mental Health Crisis:   The purpose of this group is to help patients identify strategies for coping with mental health crisis.  Group discusses possible causes of crisis and ways to manage them effectively. Goals Group:   The focus of this group is to help patients establish daily goals to achieve during treatment and discuss how the patient can incorporate goal setting into their daily lives to aide in recovery. Healthy Communication:   The focus of this group is to discuss communication, barriers to communication, as well as healthy ways to communicate with others.    Participation Level:  Did Not Attend  Participation Quality:    Affect:    Cognitive:    Insight:  Engagement in Group:    Modes of Intervention:    Additional Comments:    Samuel Riley L Samuel Riley 01/20/2023, 11:14 AM

## 2023-01-20 NOTE — Plan of Care (Signed)

## 2023-01-20 NOTE — Procedures (Signed)
Routine EEG Report  Samuel Riley is a 52 y.o. male with a history of subdural hematoma and altered mental status who is undergoing an EEG to evaluate for seizures.  Report: This EEG was acquired with electrodes placed according to the International 10-20 electrode system (including Fp1, Fp2, F3, F4, C3, C4, P3, P4, O1, O2, T3, T4, T5, T6, A1, A2, Fz, Cz, Pz). The following electrodes were missing or displaced: none.  The occipital dominant rhythm was 5-6 Hz. This activity is reactive to stimulation. Drowsiness was manifested by background fragmentation; deeper stages of sleep were identified by K complexes and sleep spindles. There was focal slowing over the right hemisphere. There were no interictal epileptiform discharges. There were no electrographic seizures identified. There was no abnormal response to photic stimulation or hyperventilation.   Impression and clinical correlation: This EEG was obtained while awake and asleep and is abnormal due to mild diffuse slowing indicative of global cerebral dysfunction and focal slowing indicating focal cerebral dysfunction over the right hemisphere. Epileptiform abnormalities were not seen during this recording.  Bing Neighbors, MD [facility] [phone]  If 7pm- 7am, please page neurology on call as listed in AMION.
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
## 2023-01-20 NOTE — Progress Notes (Signed)
PHARMACY CONSULT NOTE - FOLLOW UP  Pharmacy Consult for Electrolyte Monitoring and Replacement   Recent Labs: Potassium (mmol/L)  Date Value  01/20/2023 2.5 (LL)   Magnesium (mg/dL)  Date Value  22/03/5425 1.6 (L)   Calcium (mg/dL)  Date Value  08/19/7626 7.0 (L)   Albumin (g/dL)  Date Value  31/51/7616 2.6 (L)   Phosphorus (mg/dL)  Date Value  07/37/1062 1.4 (L)   Sodium (mmol/L)  Date Value  01/20/2023 126 (L)     Assessment: 11/24 @ 0529:   K = 2.5,  Phos = 1.4,  Mag = 1.6                           Ca = 7.0,  Alb = 2.6,  Corrected Ca = 8.12  Goal of Therapy:  Electrolytes WNL   Plan:  KCl 10 mEq IV X 8 ordered to start on 11/24 @ ~ 0700.  Mag Sulfate 2 gm IV X 1 ordered for 11/24 @ 0700.  KPhos 45 mmol IV X 1 ordered for 11/24 @ 0700.  Calcium gluconate 1 gm IV X 1 ordered 11/24 @ 0700.   - recheck electrolytes on 11/24 @ ~ 1800.   Scherrie Gerlach ,PharmD Clinical Pharmacist 01/20/2023 6:48 AM

## 2023-01-20 NOTE — Progress Notes (Signed)
PHARMACY CONSULT NOTE - ELECTROLYTES  Pharmacy Consult for Electrolyte Monitoring and Replacement   Recent Labs: Height: 5\' 8"  (172.7 cm) Weight: 73 kg (160 lb 15 oz) IBW/kg (Calculated) : 68.4 Estimated Creatinine Clearance: 103.3 mL/min (A) (by C-G formula based on SCr of 0.56 mg/dL (L)). Potassium (mmol/L)  Date Value  01/20/2023 2.9 (L)   Magnesium (mg/dL)  Date Value  96/05/5407 2.0   Calcium (mg/dL)  Date Value  81/19/1478 7.3 (L)   Albumin (g/dL)  Date Value  29/56/2130 2.6 (L)   Phosphorus (mg/dL)  Date Value  86/57/8469 2.5   Sodium (mmol/L)  Date Value  01/20/2023 123 (L)   Corrected Ca: 8.7 mg/dL  Assessment  Samuel Riley is a 52 y.o. male presenting with traumatic SDH w/ midline shift now s/p craniotomy w/ evacuation. PMH significant for alcohol use disorder. Pharmacy has been consulted to monitor and replace electrolytes.   Diet: regular MIVF: none  Goal of Therapy: Electrolytes WNL  Plan:  K 2.9, give KCl 40 mEq PO x2 Check BMP, Mg, Phos with AM labs  Thank you for allowing pharmacy to be a part of this patient's care.  Celene Squibb, PharmD Clinical Pharmacist 01/20/2023 6:11 PM

## 2023-01-21 ENCOUNTER — Encounter: Payer: Self-pay | Admitting: Neurosurgery

## 2023-01-21 DIAGNOSIS — S065XAA Traumatic subdural hemorrhage with loss of consciousness status unknown, initial encounter: Secondary | ICD-10-CM | POA: Diagnosis not present

## 2023-01-21 LAB — MAGNESIUM: Magnesium: 2.1 mg/dL (ref 1.7–2.4)

## 2023-01-21 LAB — CBC WITH DIFFERENTIAL/PLATELET
Abs Immature Granulocytes: 0.67 10*3/uL — ABNORMAL HIGH (ref 0.00–0.07)
Basophils Absolute: 0.1 10*3/uL (ref 0.0–0.1)
Basophils Relative: 1 %
Eosinophils Absolute: 0.2 10*3/uL (ref 0.0–0.5)
Eosinophils Relative: 1 %
HCT: 26.2 % — ABNORMAL LOW (ref 39.0–52.0)
Hemoglobin: 9.6 g/dL — ABNORMAL LOW (ref 13.0–17.0)
Immature Granulocytes: 5 %
Lymphocytes Relative: 19 %
Lymphs Abs: 2.5 10*3/uL (ref 0.7–4.0)
MCH: 34.4 pg — ABNORMAL HIGH (ref 26.0–34.0)
MCHC: 36.6 g/dL — ABNORMAL HIGH (ref 30.0–36.0)
MCV: 93.9 fL (ref 80.0–100.0)
Monocytes Absolute: 2 10*3/uL — ABNORMAL HIGH (ref 0.1–1.0)
Monocytes Relative: 15 %
Neutro Abs: 8.1 10*3/uL — ABNORMAL HIGH (ref 1.7–7.7)
Neutrophils Relative %: 59 %
Platelets: 181 10*3/uL (ref 150–400)
RBC: 2.79 MIL/uL — ABNORMAL LOW (ref 4.22–5.81)
RDW: 12.1 % (ref 11.5–15.5)
Smear Review: NORMAL
WBC: 13.5 10*3/uL — ABNORMAL HIGH (ref 4.0–10.5)
nRBC: 4.3 % — ABNORMAL HIGH (ref 0.0–0.2)

## 2023-01-21 LAB — HEPATIC FUNCTION PANEL
ALT: 93 U/L — ABNORMAL HIGH (ref 0–44)
AST: 145 U/L — ABNORMAL HIGH (ref 15–41)
Albumin: 2.7 g/dL — ABNORMAL LOW (ref 3.5–5.0)
Alkaline Phosphatase: 51 U/L (ref 38–126)
Bilirubin, Direct: 0.4 mg/dL — ABNORMAL HIGH (ref 0.0–0.2)
Indirect Bilirubin: 1.5 mg/dL — ABNORMAL HIGH (ref 0.3–0.9)
Total Bilirubin: 1.9 mg/dL — ABNORMAL HIGH (ref <1.2)
Total Protein: 5 g/dL — ABNORMAL LOW (ref 6.5–8.1)

## 2023-01-21 LAB — BASIC METABOLIC PANEL
Anion gap: 8 (ref 5–15)
BUN: 8 mg/dL (ref 6–20)
CO2: 26 mmol/L (ref 22–32)
Calcium: 7.5 mg/dL — ABNORMAL LOW (ref 8.9–10.3)
Chloride: 95 mmol/L — ABNORMAL LOW (ref 98–111)
Creatinine, Ser: 0.57 mg/dL — ABNORMAL LOW (ref 0.61–1.24)
GFR, Estimated: >60 mL/min (ref >60)
Glucose, Bld: 124 mg/dL — ABNORMAL HIGH (ref 70–99)
Potassium: 3.2 mmol/L — ABNORMAL LOW (ref 3.5–5.1)
Sodium: 129 mmol/L — ABNORMAL LOW (ref 135–145)

## 2023-01-21 LAB — IRON AND TIBC
Iron: 33 ug/dL — ABNORMAL LOW (ref 45–182)
Saturation Ratios: 18 % (ref 17.9–39.5)
TIBC: 186 ug/dL — ABNORMAL LOW (ref 250–450)
UIBC: 153 ug/dL

## 2023-01-21 LAB — TYPE AND SCREEN
ABO/RH(D): A POS
Antibody Screen: POSITIVE
Unit division: 0
Unit division: 0

## 2023-01-21 LAB — BPAM RBC
Blood Product Expiration Date: 202412082359
Blood Product Expiration Date: 202412202359
Unit Type and Rh: 5100
Unit Type and Rh: 6200

## 2023-01-21 LAB — GLUCOSE, CAPILLARY
Glucose-Capillary: 129 mg/dL — ABNORMAL HIGH (ref 70–99)
Glucose-Capillary: 112 mg/dL — ABNORMAL HIGH (ref 70–99)
Glucose-Capillary: 144 mg/dL — ABNORMAL HIGH (ref 70–99)
Glucose-Capillary: 156 mg/dL — ABNORMAL HIGH (ref 70–99)
Glucose-Capillary: 206 mg/dL — ABNORMAL HIGH (ref 70–99)
Glucose-Capillary: 73 mg/dL (ref 70–99)

## 2023-01-21 LAB — OSMOLALITY: Osmolality: 270 mosm/kg — ABNORMAL LOW (ref 275–295)

## 2023-01-21 LAB — PHOSPHORUS: Phosphorus: 2.5 mg/dL (ref 2.5–4.6)

## 2023-01-21 LAB — CBC
Hemoglobin: 8.6 g/dL — ABNORMAL LOW (ref 13.0–17.0)
Platelets: 127 10*3/uL — ABNORMAL LOW (ref 150–400)
WBC: 14.2 10*3/uL — ABNORMAL HIGH (ref 4.0–10.5)

## 2023-01-21 LAB — PROCALCITONIN: Procalcitonin: 0.25 ng/mL

## 2023-01-21 LAB — POTASSIUM: Potassium: 4 mmol/L (ref 3.5–5.1)

## 2023-01-21 MED ORDER — ENSURE ENLIVE PO LIQD
237.0000 mL | Freq: Three times a day (TID) | ORAL | Status: DC
  Administered 2023-01-21: 237 mL via ORAL

## 2023-01-21 MED ORDER — POTASSIUM CHLORIDE CRYS ER 20 MEQ PO TBCR: 40.0000 meq | EXTENDED_RELEASE_TABLET | ORAL | Status: DC

## 2023-01-21 MED ORDER — VITAMIN C 500 MG PO TABS
500.0000 mg | ORAL_TABLET | Freq: Two times a day (BID) | ORAL | Status: DC
Start: 2023-01-21 — End: 2023-02-04
  Administered 2023-01-21 – 2023-02-04 (×28): 500 mg via ORAL
  Filled 2023-01-21 (×28): qty 1

## 2023-01-21 MED ORDER — DILTIAZEM HCL 30 MG PO TABS
30.0000 mg | ORAL_TABLET | Freq: Three times a day (TID) | ORAL | Status: DC | PRN
  Administered 2023-01-26 (×2): 30 mg via ORAL
  Filled 2023-01-21 (×3): qty 1

## 2023-01-21 MED ORDER — GUAIFENESIN 100 MG/5ML PO LIQD
5.0000 mL | Freq: Once | ORAL | Status: AC
  Administered 2023-01-21: 5 mL via ORAL
  Filled 2023-01-21: qty 10

## 2023-01-21 MED ORDER — POTASSIUM CHLORIDE CRYS ER 20 MEQ PO TBCR
40.0000 meq | EXTENDED_RELEASE_TABLET | ORAL | Status: AC
  Administered 2023-01-21 (×2): 40 meq via ORAL
  Filled 2023-01-21 (×2): qty 2

## 2023-01-21 MED ORDER — ORAL CARE MOUTH RINSE: 15.0000 mL | OROMUCOSAL | Status: DC | PRN

## 2023-01-21 MED ORDER — VITAMIN D (ERGOCALCIFEROL) 1.25 MG (50000 UNIT) PO CAPS
50000.0000 [IU] | ORAL_CAPSULE | ORAL | Status: DC
Start: 2023-01-21 — End: 2023-02-04
  Administered 2023-01-21 – 2023-02-04 (×3): 50000 [IU] via ORAL
  Filled 2023-01-21 (×3): qty 1

## 2023-01-21 NOTE — Progress Notes (Signed)
Attending Progress Note  History: Samuel Riley is here for subdural hematoma status post left-sided craniotomy for subdural hematoma evacuation, has been in the ICU postoperatively.  Yesterday's events: Drain removed, updated to stepdown unit status.  Physical Exam: Vitals:   01/21/23 0500 01/21/23 0600  BP:  106/69  Pulse: (!) 112 (!) 117  Resp: 15 16  Temp:    SpO2: 96% 97%    Awakes easily to voice, oriented to situation and location CNI, periorbital ecchymosis  Moving all 4 extremities to command  Data:  Recent Labs  Lab 01/20/23 0529 01/20/23 1741 01/20/23 2356 01/21/23 0332  NA 126* 123*  --  129*  K 2.5* 2.9*   < > 3.2*  CL 88* 87*  --  95*  CO2 31 27  --  26  BUN 6 6  --  8  CREATININE 0.61 0.56*  --  0.57*  GLUCOSE 116* 141*  --  124*  CALCIUM 7.0* 7.3*  --  7.5*   < > = values in this interval not displayed.   Recent Labs  Lab 01/20/23 0529  AST 177*  ALT 90*  ALKPHOS 47     Recent Labs  Lab 01/19/23 0340 01/20/23 0529 01/21/23 0332  WBC 14.6* 14.2* 13.5*  HGB 8.9* 8.6* 9.6*  HCT 23.8* 22.8* 26.2*  PLT 93* 127* 181   Recent Labs  Lab 01/17/23 1550 01/18/23 0229 01/19/23 0340 01/20/23 0529  APTT 23*  --   --   --   INR 1.0 1.2 1.0 1.1         Other tests/results: No new cranial imaging  Assessment/Plan:  Samuel Riley 52 year old man with a history of chronic alcoholism who was admitted with a large left-sided subdural hematoma.  He had a subdural hematoma evacuation on 01/17/2023.  He is currently postoperative day 4 -Drain was removed yesterday -Okay to go to every 4 neurochecks given stability -No need for scheduled cranial imaging today - mobilize - pain control - DVT prophylaxis - PTOT -Continue daily CBCs, ideally sodium will be around 130 or greater given his history of hyponatremia and alcoholism, generally we like these patients to be above 135 but given his nutrition and alcoholism will except sodiums 128 or  higher.  Samuel Kim, MD/MSCR Department of Neurosurgery

## 2023-01-21 NOTE — Progress Notes (Signed)
MD notified that when eating, patient's HR went up to 130s-140s. Ordered PRN Cardizem for HR > 120.

## 2023-01-21 NOTE — Telephone Encounter (Signed)
Refilled: 09/13/2022 Last OV: 06/01/2022 Next OV: not scheduled

## 2023-01-21 NOTE — Plan of Care (Signed)
  Problem: Education: Goal: Knowledge of General Education information will improve Description: Including pain rating scale, medication(s)/side effects and non-pharmacologic comfort measures Outcome: Progressing   Problem: Clinical Measurements: Goal: Ability to maintain clinical measurements within normal limits will improve Outcome: Progressing Goal: Will remain free from infection Outcome: Progressing Goal: Diagnostic test results will improve Outcome: Progressing Goal: Respiratory complications will improve Outcome: Progressing Goal: Cardiovascular complication will be avoided Outcome: Progressing   Problem: Activity: Goal: Risk for activity intolerance will decrease Outcome: Progressing   Problem: Nutrition: Goal: Adequate nutrition will be maintained Outcome: Progressing   Problem: Coping: Goal: Level of anxiety will decrease Outcome: Progressing   Problem: Elimination: Goal: Will not experience complications related to bowel motility Outcome: Progressing Goal: Will not experience complications related to urinary retention Outcome: Progressing   Problem: Pain Management: Goal: General experience of comfort will improve Outcome: Progressing   Problem: Safety: Goal: Ability to remain free from injury will improve Outcome: Progressing   Problem: Skin Integrity: Goal: Risk for impaired skin integrity will decrease Outcome: Progressing   Problem: Fluid Volume: Goal: Ability to maintain a balanced intake and output will improve Outcome: Progressing   Problem: Metabolic: Goal: Ability to maintain appropriate glucose levels will improve Outcome: Progressing   Problem: Nutritional: Goal: Maintenance of adequate nutrition will improve Outcome: Progressing   Problem: Skin Integrity: Goal: Risk for impaired skin integrity will decrease Outcome: Progressing   Problem: Tissue Perfusion: Goal: Adequacy of tissue perfusion will improve Outcome: Progressing
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
## 2023-01-21 NOTE — TOC Initial Note (Signed)
Transition of Care [facility]) - Initial/Assessment Note    Patient Details  Name: Kaile Kyger MRN: [id number] Date of Birth: Sep 05, 1970  Transition of Care [facility]) CM/SW Contact:    Margarito Liner, LCSW Phone Number: 01/21/2023, 1:46 PM  Clinical Narrative:   CSW met with patient. RN and sitter at bedside. CSW introduced role and inquired about interest in SA resources. Patient declined. CSW encouraged patient to notify RN if he changes his mind so resources can be printed for him. No further concerns. CSW will continue to follow patient's progress and facilitate return home once stable.               Expected Discharge Plan: Home/Self Care Barriers to Discharge: Continued Medical Work up   Patient Goals and CMS Choice            Expected Discharge Plan and Services                                              Prior Living Arrangements/Services     Patient language and need for interpreter reviewed:: Yes Do you feel safe going back to the place where you live?: Yes      Need for Family Participation in Patient Care: Yes (Comment)     Criminal Activity/Legal Involvement Pertinent to Current Situation/Hospitalization: No - Comment as needed  Activities of Daily Living      Permission Sought/Granted                  Emotional Assessment Appearance:: Appears stated age Attitude/Demeanor/Rapport: Engaged Affect (typically observed): Appropriate, Calm Orientation: : Oriented to Self, Oriented to Place, Oriented to  Time, Oriented to Situation Alcohol / Substance Use: Alcohol Use Psych Involvement: No (comment)  Admission diagnosis:  Subdural hematoma (HCC) [S06.5XAA] Alcoholic intoxication without complication (HCC) [F10.920] Patient Active Problem List   Diagnosis Date Noted   Subdural hematoma (HCC) 01/17/2023   Right sided weakness 01/17/2023   Traumatic brain compression with herniation, initial encounter (HCC) 01/17/2023   Alcoholic  intoxication without complication (HCC) 01/17/2023   Major depressive disorder with single episode 08/08/2021   Alcohol abuse 03/10/2021   Dyslipidemia 06/12/2017   Monocytosis 06/11/2017   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 [place] [address] Phone: [phone] Fax: [phone]  [facility] [place], [place] - [address] [place] [address] Phone: [phone] Fax: [phone]  [facility] - [place], [place] - [address] Renee Harder [place] [address] Phone: [phone] Fax: [phone]     Social Determinants of Health (SDOH) Social History: SDOH Screenings   Depression (PHQ2-9): Low Risk  (08/08/2021)  Tobacco Use: High Risk (01/17/2023)   SDOH Interventions:     Readmission Risk Interventions     No data  to display

## 2023-01-21 NOTE — Progress Notes (Signed)
PHARMACY CONSULT NOTE - ELECTROLYTES  Pharmacy Consult for Electrolyte Monitoring and Replacement   Recent Labs: Height: 5\' 8"  (172.7 cm) Weight: 68.9 kg (151 lb 14.4 oz) IBW/kg (Calculated) : 68.4 Estimated Creatinine Clearance: 103.3 mL/min (A) (by C-G formula based on SCr of 0.57 mg/dL (L)). Potassium (mmol/L)  Date Value  01/21/2023 3.2 (L)   Magnesium (mg/dL)  Date Value  82/95/6213 2.1   Calcium (mg/dL)  Date Value  08/65/7846 7.5 (L)   Albumin (g/dL)  Date Value  96/29/5284 2.6 (L)   Phosphorus (mg/dL)  Date Value  13/24/4010 2.5   Sodium (mmol/L)  Date Value  01/21/2023 129 (L)   Corrected Ca: 8.7 mg/dL  Assessment  Samuel Riley is a 52 y.o. male presenting with traumatic SDH w/ midline shift now s/p craniotomy w/ evacuation. PMH significant for alcohol use disorder. Pharmacy has been consulted to monitor and replace electrolytes.   Goal of Therapy: Electrolytes WNL  Plan:  Will give KCl 40 mEq PO x 2 Check electrolytes with AM labs  Thank you for allowing pharmacy to be a part of this patient's care.  Lowella Bandy, PharmD Clinical Pharmacist 01/21/2023 7:13 AM

## 2023-01-21 NOTE — Plan of Care (Signed)
  Problem: Clinical Measurements: Goal: Respiratory complications will improve Outcome: Progressing Goal: Cardiovascular complication will be avoided Outcome: Progressing   Problem: Activity: Goal: Risk for activity intolerance will decrease Outcome: Progressing   Problem: Education: Goal: Knowledge of General Education information will improve Description: Including pain rating scale, medication(s)/side effects and non-pharmacologic comfort measures Outcome: Not Progressing   Problem: Health Behavior/Discharge Planning: Goal: Ability to manage health-related needs will improve Outcome: Not Progressing   Problem: Clinical Measurements: Goal: Diagnostic test results will improve Outcome: Not Progressing   Problem: Nutrition: Goal: Adequate nutrition will be maintained Outcome: Not Progressing   Problem: Coping: Goal: Level of anxiety will decrease Outcome: Not Progressing
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
## 2023-01-22 DIAGNOSIS — S065XAA Traumatic subdural hemorrhage with loss of consciousness status unknown, initial encounter: Secondary | ICD-10-CM | POA: Diagnosis not present

## 2023-01-22 LAB — CBC
HCT: 26 % — ABNORMAL LOW (ref 39.0–52.0)
Hemoglobin: 9.4 g/dL — ABNORMAL LOW (ref 13.0–17.0)
MCH: 34.9 pg — ABNORMAL HIGH (ref 26.0–34.0)
MCHC: 36.2 g/dL — ABNORMAL HIGH (ref 30.0–36.0)
MCV: 96.7 fL (ref 80.0–100.0)
Platelets: 302 10*3/uL (ref 150–400)
RBC: 2.69 MIL/uL — ABNORMAL LOW (ref 4.22–5.81)
RDW: 13.2 % (ref 11.5–15.5)
WBC: 13.8 10*3/uL — ABNORMAL HIGH (ref 4.0–10.5)
nRBC: 1.1 % — ABNORMAL HIGH (ref 0.0–0.2)

## 2023-01-22 LAB — BASIC METABOLIC PANEL
Anion gap: 6 (ref 5–15)
BUN: 8 mg/dL (ref 6–20)
CO2: 25 mmol/L (ref 22–32)
Calcium: 7.8 mg/dL — ABNORMAL LOW (ref 8.9–10.3)
Chloride: 96 mmol/L — ABNORMAL LOW (ref 98–111)
Creatinine, Ser: 0.68 mg/dL (ref 0.61–1.24)
GFR, Estimated: >60 mL/min (ref >60)
Glucose, Bld: 188 mg/dL — ABNORMAL HIGH (ref 70–99)
Potassium: 3.7 mmol/L (ref 3.5–5.1)
Sodium: 127 mmol/L — ABNORMAL LOW (ref 135–145)

## 2023-01-22 LAB — GLUCOSE, CAPILLARY
Glucose-Capillary: 161 mg/dL — ABNORMAL HIGH (ref 70–99)
Glucose-Capillary: 114 mg/dL — ABNORMAL HIGH (ref 70–99)
Glucose-Capillary: 177 mg/dL — ABNORMAL HIGH (ref 70–99)
Glucose-Capillary: 159 mg/dL — ABNORMAL HIGH (ref 70–99)
Glucose-Capillary: 163 mg/dL — ABNORMAL HIGH (ref 70–99)

## 2023-01-22 LAB — HEPATIC FUNCTION PANEL
ALT: 84 U/L — ABNORMAL HIGH (ref 0–44)
AST: 86 U/L — ABNORMAL HIGH (ref 15–41)
Albumin: 2.8 g/dL — ABNORMAL LOW (ref 3.5–5.0)
Alkaline Phosphatase: 59 U/L (ref 38–126)
Bilirubin, Direct: 0.3 mg/dL — ABNORMAL HIGH (ref 0.0–0.2)
Indirect Bilirubin: 0.9 mg/dL (ref 0.3–0.9)
Total Bilirubin: 1.2 mg/dL — ABNORMAL HIGH (ref <1.2)
Total Protein: 5.3 g/dL — ABNORMAL LOW (ref 6.5–8.1)

## 2023-01-22 LAB — MAGNESIUM: Magnesium: 1.9 mg/dL (ref 1.7–2.4)

## 2023-01-22 LAB — PHOSPHORUS: Phosphorus: 2.1 mg/dL — ABNORMAL LOW (ref 2.5–4.6)

## 2023-01-22 LAB — PROCALCITONIN: Procalcitonin: 0.17 ng/mL

## 2023-01-22 LAB — MRSA NEXT GEN BY PCR, NASAL: MRSA by PCR Next Gen: NOT DETECTED

## 2023-01-22 MED ORDER — LEVETIRACETAM 500 MG PO TABS
500.0000 mg | ORAL_TABLET | Freq: Two times a day (BID) | ORAL | Status: DC
  Administered 2023-01-22 – 2023-01-24 (×4): 500 mg via ORAL
  Filled 2023-01-22 (×4): qty 1

## 2023-01-22 MED ORDER — GLUCERNA SHAKE PO LIQD
237.0000 mL | Freq: Three times a day (TID) | ORAL | Status: DC
  Administered 2023-01-22 (×2): 237 mL via ORAL

## 2023-01-22 MED ORDER — DIVALPROEX SODIUM 500 MG PO DR TAB: 500.0000 mg | DELAYED_RELEASE_TABLET | Freq: Two times a day (BID) | ORAL | Status: DC

## 2023-01-22 MED ORDER — INSULIN ASPART 100 UNIT/ML IJ SOLN
0.0000 [IU] | Freq: Three times a day (TID) | INTRAMUSCULAR | Status: DC
  Administered 2023-01-22 – 2023-01-23 (×2): 2 [IU] via SUBCUTANEOUS
  Administered 2023-01-23: 1 [IU] via SUBCUTANEOUS
  Administered 2023-01-23 – 2023-01-24 (×4): 2 [IU] via SUBCUTANEOUS
  Administered 2023-01-24 – 2023-01-25 (×2): 1 [IU] via SUBCUTANEOUS
  Administered 2023-01-25 (×2): 2 [IU] via SUBCUTANEOUS
  Filled 2023-01-22 (×10): qty 1

## 2023-01-22 MED ORDER — DIVALPROEX SODIUM 500 MG PO DR TAB
750.0000 mg | DELAYED_RELEASE_TABLET | Freq: Every day | ORAL | Status: DC
  Administered 2023-01-22 – 2023-02-04 (×14): 750 mg via ORAL
  Filled 2023-01-22 (×15): qty 1

## 2023-01-22 MED ORDER — DIVALPROEX SODIUM 500 MG PO DR TAB
500.0000 mg | DELAYED_RELEASE_TABLET | Freq: Every day | ORAL | Status: DC
  Administered 2023-01-22 – 2023-02-03 (×13): 500 mg via ORAL
  Filled 2023-01-22 (×14): qty 1

## 2023-01-22 MED ORDER — THIAMINE MONONITRATE 100 MG PO TABS
100.0000 mg | ORAL_TABLET | Freq: Every day | ORAL | Status: DC
  Administered 2023-01-23 – 2023-02-04 (×13): 100 mg via ORAL
  Filled 2023-01-22 (×13): qty 1

## 2023-01-22 MED ORDER — CHLORDIAZEPOXIDE HCL 10 MG PO CAPS
10.0000 mg | ORAL_CAPSULE | Freq: Two times a day (BID) | ORAL | Status: AC
  Administered 2023-01-22 – 2023-01-23 (×4): 10 mg via ORAL
  Filled 2023-01-22 (×2): qty 2
  Filled 2023-01-22: qty 1

## 2023-01-22 MED ORDER — SODIUM PHOSPHATES 45 MMOLE/15ML IV SOLN
30.0000 mmol | Freq: Once | INTRAVENOUS | Status: AC
  Administered 2023-01-22: 30 mmol via INTRAVENOUS
  Filled 2023-01-22: qty 10

## 2023-01-22 MED ORDER — PANTOPRAZOLE SODIUM 40 MG PO TBEC
40.0000 mg | DELAYED_RELEASE_TABLET | Freq: Every day | ORAL | Status: DC
  Administered 2023-01-22 – 2023-02-04 (×14): 40 mg via ORAL
  Filled 2023-01-22 (×14): qty 1

## 2023-01-22 MED ORDER — CHLORDIAZEPOXIDE HCL 5 MG PO CAPS
5.0000 mg | ORAL_CAPSULE | Freq: Every day | ORAL | Status: AC
  Administered 2023-01-24 – 2023-01-25 (×2): 5 mg via ORAL
  Filled 2023-01-22 (×2): qty 1

## 2023-01-22 NOTE — Progress Notes (Signed)
PHARMACY CONSULT NOTE - ELECTROLYTES  Pharmacy Consult for Electrolyte Monitoring and Replacement   Recent Labs: Height: 5\' 8"  (172.7 cm) Weight: 67.7 kg (149 lb 4 oz) IBW/kg (Calculated) : 68.4 Estimated Creatinine Clearance: 102.3 mL/min (by C-G formula based on SCr of 0.68 mg/dL). Potassium (mmol/L)  Date Value  01/22/2023 3.7   Magnesium (mg/dL)  Date Value  40/98/1191 1.9   Calcium (mg/dL)  Date Value  47/82/9562 7.8 (L)   Albumin (g/dL)  Date Value  13/09/6576 2.8 (L)   Phosphorus (mg/dL)  Date Value  46/96/2952 2.1 (L)   Sodium (mmol/L)  Date Value  01/22/2023 127 (L)   Corrected Ca: 8.7 mg/dL  Assessment  Samuel Riley is a 52 y.o. male presenting with traumatic SDH w/ midline shift now s/p craniotomy w/ evacuation. PMH significant for alcohol use disorder. Pharmacy has been consulted to monitor and replace electrolytes.   Goal of Therapy: Electrolytes WNL  Plan:  30 mmol IV sodium phosphate x 1 Check electrolytes with AM labs  Thank you for allowing pharmacy to be a part of this patient's care.  Lowella Bandy, PharmD Clinical Pharmacist 01/22/2023 7:26 AM
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
## 2023-01-22 NOTE — Progress Notes (Signed)
Attending Progress Note  History: Samuel Riley is here for subdural hematoma status POD 5 s/p left-sided craniotomy for subdural hematoma evacuation, has been in the ICU postoperatively, now under the care of the hospitalist team.   Physical Exam: Vitals:   01/22/23 0600 01/22/23 0800  BP: 116/70   Pulse:    Resp: 15   Temp:  98.4 F (36.9 C)  SpO2:      Awakes easily to voice, oriented to situation and location CNI, periorbital ecchymosis  Moving all 4 extremities to command  Data:  Recent Labs  Lab 01/20/23 1741 01/20/23 2356 01/21/23 0332 01/22/23 0441  NA 123*  --  129* 127*  K 2.9*   < > 3.2* 3.7  CL 87*  --  95* 96*  CO2 27  --  26 25  BUN 6  --  8 8  CREATININE 0.56*  --  0.57* 0.68  GLUCOSE 141*  --  124* 188*  CALCIUM 7.3*  --  7.5* 7.8*   < > = values in this interval not displayed.   Recent Labs  Lab 01/22/23 0441  AST 86*  ALT 84*  ALKPHOS 59     Recent Labs  Lab 01/20/23 0529 01/21/23 0332 01/22/23 0441  WBC 14.2* 13.5* 13.8*  HGB 8.6* 9.6* 9.4*  HCT  --  26.2* 26.0*  PLT 127* 181 302   Recent Labs  Lab 01/17/23 1550 01/18/23 0229 01/19/23 0340 01/20/23 0529  APTT 23*  --   --   --   INR 1.0 1.2 1.0 1.1         Other tests/results: No new cranial imaging  Assessment/Plan:  Samuel Riley 52 year old man with a history of chronic alcoholism who was admitted with a large left-sided subdural hematoma.  He had a subdural hematoma evacuation on 01/17/2023.  He is currently postoperative day 5 -Dressing was removed. Wound should remain clean and may be left open to air - pain control - DVT prophylaxis - PTOT -Continue daily CBCs, ideally sodium will be around 130 or greater given his history of hyponatremia and alcoholism, generally we like these patients to be above 135 but given his nutrition and alcoholism will except sodiums 128 or higher.  Joan Flores, PA-C Department of Neurosurgery

## 2023-01-22 NOTE — Progress Notes (Signed)
Walked with patient around the unit one full lap. HR, ST @ 165bpm. Mostly steady, used me twice to steady himself.  Stated that he was wornout after. Got back into bed. HR down to 145bpm.

## 2023-01-22 NOTE — Evaluation (Signed)
Physical Therapy Evaluation Patient Details Name: Samuel Riley MRN: [id number] DOB: [birth year] Today's Date: 01/22/2023  History of Present Illness  Patient is a 52 year old male with alcohol intoxication and fall with subdural hematoma. S/p left craniotomy for evacuation. Patient has been extubated. History of ETOH  Clinical Impression  Patient is agreeable to PT evaluation. He is irritable at times, mildly impulsive, but cooperative with mobility assessment. He reports he is independent and lives alone prior to this hospital stay.  Patient required safety cues with mobility. He is mildly unsteady with ambulation initially with improvement with increased ambulation distance. Patient is fatigued with activity with heart rate up to 148bpm. The patient is not at his baseline level of independence. Recommend to continue PT to maximize independence, safety, and decrease caregiver burden.     If plan is discharge home, recommend the following: Assistance with cooking/housework;Assist for transportation;Help with stairs or ramp for entrance;Supervision due to cognitive status   Can travel by private vehicle        Equipment Recommendations None recommended by PT  Recommendations for Other Services       Functional Status Assessment Patient has had a recent decline in their functional status and demonstrates the ability to make significant improvements in function in a reasonable and predictable amount of time.     Precautions / Restrictions Precautions Precautions: Fall Precaution Comments: impulsive Restrictions Weight Bearing Restrictions: No      Mobility  Bed Mobility Overal bed mobility: Needs Assistance Bed Mobility: Supine to Sit     Supine to sit: Supervision          Transfers Overall transfer level: Needs assistance Equipment used: None Transfers: Sit to/from Stand Sit to Stand: Contact guard assist           General transfer comment: CGA for safety  with very wide base of support    Ambulation/Gait Ambulation/Gait assistance: Min assist Gait Distance (Feet): 75 Feet Assistive device: 1 person hand held assist Gait Pattern/deviations: Step-through pattern, Decreased stride length, Wide base of support Gait velocity: decreased   Pre-gait activities: wide base of support initially with cues for normal stance. General Gait Details: unsteadiness more pronounced initially with improvement in balance with incresed ambulation distance. patient is fatigued with activity with heart rate up to 148bpm with walking.  Stairs            Wheelchair Mobility     Tilt Bed    Modified Rankin (Stroke Patients Only)       Balance Overall balance assessment: Needs assistance Sitting-balance support: Feet supported Sitting balance-Leahy Scale: Fair     Standing balance support: Single extremity supported Standing balance-Leahy Scale: Poor Standing balance comment: HHA required for dynamic activity                             Pertinent Vitals/Pain Pain Assessment Pain Assessment: No/denies pain    Home Living Family/patient expects to be discharged to:: Private residence Living Arrangements: Alone Available Help at Discharge: Family Type of Home: House Home Access:  (one step up)       Home Layout: One level        Prior Function Prior Level of Function : Independent/Modified Independent                     Extremity/Trunk Assessment   Upper Extremity Assessment Upper Extremity Assessment: Defer to OT evaluation    Lower Extremity  Assessment Lower Extremity Assessment: Generalized weakness (5/5 dorsiflexion/plantarflexion, able to SLR independently. no knee buckling with weight bearing but endurance impaired for sustained activity and generally unsteady with standing activity)    Cervical / Trunk Assessment Cervical / Trunk Assessment:  (craniotomy)  Communication    Communication Communication: Difficulty following commands/understanding Following commands: Follows one step commands with increased time Cueing Techniques: Verbal cues  Cognition Arousal: Alert Behavior During Therapy: Impulsive Overall Cognitive Status: Impaired/Different from baseline Area of Impairment: Safety/judgement, Following commands, Problem solving                       Following Commands: Follows one step commands with increased time Safety/Judgement: Decreased awareness of safety, Decreased awareness of deficits   Problem Solving: Slow processing, Requires verbal cues, Decreased initiation General Comments: easily irritated but is mostly cooperative. safety cues with mild impulsive behavior and decreased awareness of deficits        General Comments      Exercises     Assessment/Plan    PT Assessment Patient needs continued PT services  PT Problem List Decreased strength;Decreased range of motion;Decreased activity tolerance;Decreased balance;Decreased mobility;Decreased cognition;Decreased safety awareness;Decreased knowledge of precautions       PT Treatment Interventions Gait training;DME instruction;Stair training;Functional mobility training;Therapeutic activities;Therapeutic exercise;Balance training;Neuromuscular re-education;Cognitive remediation;Patient/family education    PT Goals (Current goals can be found in the Care Plan section)  Acute Rehab PT Goals Patient Stated Goal: to go home as soon as possible PT Goal Formulation: With patient Time For Goal Achievement: 02/05/23 Potential to Achieve Goals: Fair    Frequency Min 1X/week     Co-evaluation PT/OT/SLP Co-Evaluation/Treatment: Yes Reason for Co-Treatment: For patient/therapist safety;Necessary to address cognition/behavior during functional activity PT goals addressed during session: Mobility/safety with mobility         AM-PAC PT "6 Clicks" Mobility  Outcome Measure Help  needed turning from your back to your side while in a flat bed without using bedrails?: None Help needed moving from lying on your back to sitting on the side of a flat bed without using bedrails?: A Little Help needed moving to and from a bed to a chair (including a wheelchair)?: A Little Help needed standing up from a chair using your arms (e.g., wheelchair or bedside chair)?: A Little Help needed to walk in hospital room?: A Little Help needed climbing 3-5 steps with a railing? : A Little 6 Click Score: 19    End of Session Equipment Utilized During Treatment: Gait belt Activity Tolerance: Patient tolerated treatment well;Patient limited by fatigue Patient left: in chair;with call bell/phone within reach;with family/visitor present Nurse Communication: Mobility status (elevated HR) PT Visit Diagnosis: Muscle weakness (generalized) (M62.81);Unsteadiness on feet (R26.81)    Time: 1610-9604 PT Time Calculation (min) (ACUTE ONLY): 17 min   Charges:   PT Evaluation $PT Eval Moderate Complexity: 1 Mod   PT General Charges $$ ACUTE PT VISIT: 1 Visit         Donna Bernard, PT, MPT   Ina Homes 01/22/2023, 10:32 AM

## 2023-01-22 NOTE — Evaluation (Addendum)
Clinical/Bedside Swallow Evaluation Patient Details  Name: Samuel Riley MRN: [id number] Date of Birth: [birth year]  Today's Date: 01/22/2023 Time: SLP Start Time (ACUTE ONLY): 1455 SLP Stop Time (ACUTE ONLY): 1535 SLP Time Calculation (min) (ACUTE ONLY): 40 min  Past Medical History:  Past Medical History:  Diagnosis Date   Alcohol abuse    Anxiety    Atypical mole 02/10/2015   Right supra auricular scalp. Mild atypia, deep margin involved.    Basal cell carcinoma 03/14/2006   Mid vertex scalp.    Basal cell carcinoma 03/26/2007   Vertex scalp.    Basal cell carcinoma 01/11/2010   Scalp.    Basal cell carcinoma 11/05/2013   Left anterior deltoid. Superficial. EDC.   Basal cell carcinoma 10/20/2014   Left superior pectoral. Nodular.   Basal cell carcinoma 10/30/2022   Right vertex scalp. Nodular, infiltrative pattern. Mohs pending   Dyslipidemia    Dysplastic nevus 03/06/2017   Right proximal ant. lat. thigh. Mild atypia, limited margins free.   Dysplastic nevus 03/06/2017   Right mid ant. thigh. Mild atypia, limited margins free.   Dysplastic nevus 03/24/2019   Left low back paraspinal. Moderate atypia, close to margin.   Monocytosis    Past Surgical History:  Past Surgical History:  Procedure Laterality Date   COLONOSCOPY WITH PROPOFOL N/A 03/17/2021   Procedure: COLONOSCOPY WITH PROPOFOL;  Surgeon: Wyline Mood, MD;  Location: [facility] ENDOSCOPY;  Service: Gastroenterology;  Laterality: N/A;   CRANIOTOMY Left 01/17/2023   Procedure: CRANIOTOMY HEMATOMA EVACUATION SUBDURAL;  Surgeon: Lovenia Kim, MD;  Location: [facility] ORS;  Service: Neurosurgery;  Laterality: Left;   SPLENECTOMY, TOTAL N/A 2000   [facility]   HPI:  Pt is a 52 y.o. male with a past medical history of alcohol abuse/use (a 5th or more of alcohol daily) and REFLUX who presented to [facility] ED on 01/17/23 with alcohol intoxication and reports of multiple falls while intoxicated.  Pt is currently intoxicated and  unable to contribute to history, and no family at bedside, therefore history is obtained from chart review.     Per ED and Nursing notes, the patient's family contacted EMS due to noticeable bruising of his forehead, around his eyes, and forearms status post multiple falls.  They state he has been using alcohol excessively over the last several weeks, and he has no intention of quitting.  His last drink was just prior to arrival to the ED.  Pt admitted with Large Left Subdural Hematoma with significant right midline shift in the setting of Acute Alcohol Intoxication and fall.  Head CT: Acute large (1.3 cm thick) left cerebral convexity subdural  hemorrhage.  2. Significant (1.5 cm) rightward midline shift.  Neurosurgery performed emergent Left frontoparietal craniotomy for evacuation of hematoma.  CXR at admit: No acute pulmonary process.    Assessment / Plan / Recommendation  Clinical Impression   Pt seen for BSE per MD order today.  Pt awake, verbal and followed instructions; pleasant. Son present. Noted pt communicated verbally answering basic questions re: self and his environment; min slower response intermittently to confrontational questions; Orientation x4. Seemed to indicate some insight into his ETOH abuse and his current medical issues. Son reported pt was much improved since admit.  Discharge Plan involves pt going Home w/ [facility] services of PT/OT; Family support and Supervision. Recommend f/u w/ formal assessment of Cognition w/ Neurology. Discussed that ST services could be incorporated into pt's POC if noting cognitive-linguistic deficits in communication ADLs post d/c home(s/p SDH) -  discuss w/ PCP and HH order can be given. Discussed w/ Son/pt that mild deficits are suspected but difficult to fully determine in setting of Acute SDH and CRHONIC ETOH Abuse/Use. Son/pt agreed.  Afebrile. On RA.   Pt appears to present w/ functional oropharyngeal phase swallowing w/ No oropharyngeal phase dysphagia  noted, No neuromuscular deficits noted. Pt consumed po trials w/ No overt, clinical s/s of aspiration during po trials.  Pt appears at reduced risk for aspiration following general aspiration precautions. Pt does have challenging factors that could impact oropharyngeal swallowing to include deconditioning/weakness, SDH/acute recovery, ETOH abuse/use, and REFLUX s/s report. These factors can increase risk for aspiration, dysphagia as well as decreased oral intake overall.   During po trials, pt consumed all consistencies w/ no overt coughing, decline in vocal quality, or change in respiratory presentation during/post trials. O2 sats remained in upper 90s. Oral phase appeared [facility] w/ timely bolus management, mastication, and control of bolus propulsion for A-P transfer for swallowing. Oral clearing achieved w/ all trial consistencies.  OM Exam appeared [facility] w/ no unilateral weakness noted. Speech Clear. Pt fed self w/ setup support.   Recommend continue a Regular consistency diet w/ well-Cut meats, moistened foods if helpful; Thin liquids. Recommend general aspiration precautions, REFLUX precautions. Pills one at a time w/ water or whole in Puree if easier swallowing.  Education given on food consistencies and easy to eat options; general aspiration precautions to pt and Son. NSG to reconsult if any new swallow needs arise. NSG updated, agreed. Recommend Dietician f/u for support. SLP Visit Diagnosis: Dysphagia, unspecified (R13.10) (baseline REFLUX)    Aspiration Risk   (reduced following general precautions)    Diet Recommendation   Thin;Age appropriate regular = a Regular consistency diet w/ well-Cut meats, moistened foods if helpful; Thin liquids. Recommend general aspiration precautions, REFLUX precautions.  Medication Administration: Whole meds with liquid (or in puree if needed)    Other  Recommendations Recommended Consults: Consider GI evaluation (Dietician) Oral Care Recommendations: Oral  care BID;Patient independent with oral care    Recommendations for follow up therapy are one component of a multi-disciplinary discharge planning process, led by the attending physician.  Recommendations may be updated based on patient status, additional functional criteria and insurance authorization.  Follow up Recommendations No SLP follow up      Assistance Recommended at Discharge  PRN at meals  Functional Status Assessment Patient has not had a recent decline in their functional status  Frequency and Duration  (n/a)   (n/a)       Prognosis Prognosis for improved oropharyngeal function: Good Barriers to Reach Goals:  (n/a) Barriers/Prognosis Comment: SDH currently; ETOH abuse/use      Swallow Study   General Date of Onset: 01/17/23 HPI: Pt is a 52 y.o. male with a past medical history of alcohol abuse/use (a 5th or more of alcohol daily) and REFLUX who presented to [facility] ED on 01/17/23 with alcohol intoxication and reports of multiple falls while intoxicated.  Pt is currently intoxicated and unable to contribute to history, and no family at bedside, therefore history is obtained from chart review.     Per ED and Nursing notes, the patient's family contacted EMS due to noticeable bruising of his forehead, around his eyes, and forearms status post multiple falls.  They state he has been using alcohol excessively over the last several weeks, and he has no intention of quitting.  His last drink was just prior to arrival to the ED.  Pt admitted with Large Left Subdural Hematoma with significant right midline shift in the setting of Acute Alcohol Intoxication and fall.  Head CT: Acute large (1.3 cm thick) left cerebral convexity subdural  hemorrhage.  2. Significant (1.5 cm) rightward midline shift.  Neurosurgery performed emergent Left frontoparietal craniotomy for evacuation of hematoma.  CXR at admit: No acute pulmonary process. Type of Study: Bedside Swallow Evaluation Previous Swallow  Assessment: none Diet Prior to this Study: Regular;Thin liquids (Level 0) Temperature Spikes Noted: No (wbc 13.8) Respiratory Status: Room air History of Recent Intubation: Yes Total duration of intubation (days): 1 days Date extubated: 01/18/23 Behavior/Cognition: Alert;Cooperative;Pleasant mood;Distractible (at times) Oral Cavity Assessment: Within Functional Limits Oral Care Completed by SLP: Recent completion by staff Oral Cavity - Dentition: Adequate natural dentition Vision: Functional for self-feeding Self-Feeding Abilities: Able to feed self Patient Positioning: Upright in bed (sat himself upright in bed) Baseline Vocal Quality: Normal Volitional Cough: Strong Volitional Swallow: Able to elicit    Oral/Motor/Sensory Function Overall Oral Motor/Sensory Function: Within functional limits   Ice Chips Ice chips: Not tested   Thin Liquid Thin Liquid: Within functional limits Presentation: Self Fed;Straw (~5+ ozs)    Nectar Thick Nectar Thick Liquid: Not tested   Honey Thick Honey Thick Liquid: Not tested   Puree Puree: Within functional limits Other Comments: at lunch meal   Solid     Solid: Within functional limits Presentation: Self Fed (6 trials; lunch meal)        Jerilynn Som, MS, CCC-SLP Speech Language Pathologist Rehab Services; [facility] [phone] (ascom) Effrey Davidow 01/22/2023,4:01 PM

## 2023-01-22 NOTE — Evaluation (Addendum)
Occupational Therapy Evaluation Patient Details Name: Samuel Riley MRN: [id number] DOB: 1970/03/19 Today's Date: 01/22/2023   History of Present Illness Pt is a 52 year old male presenting to the ED with alcohol intoxication and multiple falls while intoxicated. Pt is admitted with subdural hematoma, status post fall S/p left craniotomy for hematoma evacuation done on 11/21   Clinical Impression   Chart reviewed, pt see for OT evaluation on this date. Pt is alert and oriented x4, impulsive throughout and generally unaware of functional deficits. Pt reports he wants to go home as soon as possible. Pt presents with deficits in strength, activity tolerance, balance, cognition affecting safe and optimal ADL completion. Bed mobility completed with supervision, STS with CGA, amb in hallway via HHA with frequent vcs throughout for safety. Grooming tasks completed in sitting with supervision, LB dressing with supervision. Pt will benefit from acute OT to address deficits and to facilitate optimal ADL completion. OT will follow acutely.       If plan is discharge home, recommend the following: A little help with walking and/or transfers;A little help with bathing/dressing/bathroom;Assistance with cooking/housework;Direct supervision/assist for medications management;Assist for transportation;Supervision due to cognitive status;Help with stairs or ramp for entrance;Direct supervision/assist for financial management    Functional Status Assessment  Patient has had a recent decline in their functional status and demonstrates the ability to make significant improvements in function in a reasonable and predictable amount of time.  Equipment Recommendations  None recommended by OT    Recommendations for Other Services       Precautions / Restrictions Precautions Precautions: Fall Precaution Comments: impulsive Restrictions Weight Bearing Restrictions: No      Mobility Bed Mobility Overal bed  mobility: Needs Assistance Bed Mobility: Supine to Sit     Supine to sit: Supervision          Transfers Overall transfer level: Needs assistance Equipment used: None Transfers: Sit to/from Stand Sit to Stand: Contact guard assist                  Balance Overall balance assessment: Needs assistance Sitting-balance support: Feet supported Sitting balance-Leahy Scale: Fair     Standing balance support: Single extremity supported Standing balance-Leahy Scale: Poor                             ADL either performed or assessed with clinical judgement   ADL Overall ADL's : Needs assistance/impaired Eating/Feeding: Supervision/ safety;Sitting   Grooming: Wash/dry face;Sitting;Supervision/safety               Lower Body Dressing: Supervision/safety Lower Body Dressing Details (indicate cue type and reason): donn socks Toilet Transfer: Minimal assistance Toilet Transfer Details (indicate cue type and reason): simulated via HHA, frequent vcs for safety         Functional mobility during ADLs: Minimal assistance (via HHA approx 75'; frequent vcs for safety)       Vision Patient Visual Report: No change from baseline Vision Assessment?: Yes Eye Alignment: Within Functional Limits Ocular Range of Motion: Within Functional Limits Tracking/Visual Pursuits: Able to track stimulus in all quads without difficulty Visual Fields: No apparent deficits Additional Comments: will continue to assess; appears [facility]     Perception Perception: Within Functional Limits       Praxis Praxis: WFL       Pertinent Vitals/Pain Pain Assessment Pain Assessment: No/denies pain     Extremity/Trunk Assessment Upper Extremity Assessment Upper Extremity Assessment:  Generalized weakness (A/PROM grossly WFL; 4-/5 throughout, mildly weak grip strength bilaterally)   Lower Extremity Assessment Lower Extremity Assessment: Defer to PT evaluation;Generalized weakness    Cervical / Trunk Assessment Cervical / Trunk Assessment:  (craniotomy)   Communication Communication Communication: Difficulty following commands/understanding Following commands: Follows one step commands with increased time Cueing Techniques: Verbal cues;Visual cues   Cognition Arousal: Alert Behavior During Therapy: Impulsive Overall Cognitive Status: Impaired/Different from baseline Area of Impairment: Attention, Memory, Following commands, Safety/judgement, Awareness                   Current Attention Level: Sustained Memory: Decreased short-term memory Following Commands: Follows one step commands with increased time Safety/Judgement: Decreased awareness of safety, Decreased awareness of deficits Awareness: Emergent   General Comments: easily irritated but cooperative, frequent vcs for safety throughout     General Comments  HR up to 148 bpm with amb, all other vitals stable throughout; pt is insistent he wants to return home asap    Exercises Other Exercises Other Exercises: edu re: role of OT, role of rehab   Shoulder Instructions      Home Living Family/patient expects to be discharged to:: Private residence Living Arrangements: Alone Available Help at Discharge: Family Type of Home: House Home Access:  (one step up)     Home Layout: One level               Home Equipment: None          Prior Functioning/Environment Prior Level of Function : Independent/Modified Independent                        OT Problem List: Decreased strength;Impaired balance (sitting and/or standing);Decreased cognition;Decreased safety awareness;Decreased activity tolerance;Decreased coordination;Decreased knowledge of use of DME or AE      OT Treatment/Interventions: Self-care/ADL training;DME and/or AE instruction;Therapeutic activities;Balance training;Therapeutic exercise;Cognitive remediation/compensation;Neuromuscular education;Energy  conservation;Patient/family education    OT Goals(Current goals can be found in the care plan section) Acute Rehab OT Goals Patient Stated Goal: get out of here OT Goal Formulation: With patient Time For Goal Achievement: 02/05/23 Potential to Achieve Goals: Good ADL Goals Pt Will Perform Grooming: with modified independence;sitting Pt Will Perform Lower Body Dressing: with modified independence;sitting/lateral leans Pt Will Transfer to Toilet: with modified independence;ambulating Pt Will Perform Toileting - Clothing Manipulation and hygiene: with modified independence;sit to/from stand  OT Frequency: Min 1X/week    Co-evaluation PT/OT/SLP Co-Evaluation/Treatment: Yes Reason for Co-Treatment: For patient/therapist safety;Necessary to address cognition/behavior during functional activity PT goals addressed during session: Mobility/safety with mobility OT goals addressed during session: ADL's and self-care      AM-PAC OT "6 Clicks" Daily Activity     Outcome Measure Help from another person eating meals?: None Help from another person taking care of personal grooming?: None Help from another person toileting, which includes using toliet, bedpan, or urinal?: A Lot Help from another person bathing (including washing, rinsing, drying)?: A Lot Help from another person to put on and taking off regular upper body clothing?: None Help from another person to put on and taking off regular lower body clothing?: None 6 Click Score: 20   End of Session Nurse Communication: Mobility status  Activity Tolerance: Patient tolerated treatment well Patient left: in chair;with call bell/phone within reach;with nursing/sitter in room;with family/visitor present  OT Visit Diagnosis: Other abnormalities of gait and mobility (R26.89);Other symptoms and signs involving cognitive function  Time: 5643-3295 OT Time Calculation (min): 17 min Charges:  OT General Charges $OT Visit: 1  Visit OT Evaluation $OT Eval Moderate Complexity: 1 Mod  Oleta Mouse, OTD OTR/L  01/22/23, 10:58 AM

## 2023-01-22 NOTE — Plan of Care (Signed)
  Problem: Clinical Measurements: Goal: Ability to maintain clinical measurements within normal limits will improve Outcome: Progressing Goal: Will remain free from infection Outcome: Progressing Goal: Respiratory complications will improve Outcome: Progressing   Problem: Nutrition: Goal: Adequate nutrition will be maintained Outcome: Progressing   Problem: Pain Management: Goal: General experience of comfort will improve Outcome: Progressing   Problem: Education: Goal: Ability to describe self-care measures that may prevent or decrease complications (Diabetes Survival Skills Education) will improve Outcome: Not Progressing Goal: Individualized Educational Video(s) Outcome: Not Progressing   Problem: Health Behavior/Discharge Planning: Goal: Ability to identify and utilize available resources and services will improve Outcome: Not Progressing Goal: Ability to manage health-related needs will improve Outcome: Not Progressing

## 2023-01-22 NOTE — TOC Progression Note (Addendum)
Transition of Care [facility]) - Progression Note    Patient Details  Name: Samuel Riley MRN: [id number] Date of Birth: 1970/12/22  [facility] CM/SW Contact  Margarito Liner, LCSW Phone Number: 01/22/2023, 12:45 PM  Clinical Narrative:   Patient is agreeable to home health. He is aware of potential copays per visit depending on if he has met his out-of-pocket maximum. CSW starting search. [facility] is reviewing. [facility] said PT/OT wouldn't be able to start services for 7-10 days but nursing could start within 24-48 hours. [facility] is checking to see if they can accept. [facility], [facility], and [facility] are unable to accept. Left messages for [facility], [facility], and [facility]. CSW called son and provided update. Discussed potential for outpatient therapy if no one is able to accept his referral.  3:07 pm: [facility] can accept referral for PT, OT, RN. CSW asked liaison to check what his copay will be.  Expected Discharge Plan: Home/Self Care Barriers to Discharge: Continued Medical Work up  Expected Discharge Plan and Services                                               Social Determinants of Health (SDOH) Interventions SDOH Screenings   Depression (PHQ2-9): Low Risk  (08/08/2021)  Tobacco Use: High Risk (01/17/2023)    Readmission Risk Interventions     No data to display

## 2023-01-22 NOTE — Telephone Encounter (Signed)
Patient is currently admitted in ICU

## 2023-01-23 DIAGNOSIS — S065XAA Traumatic subdural hemorrhage with loss of consciousness status unknown, initial encounter: Secondary | ICD-10-CM | POA: Diagnosis not present

## 2023-01-23 LAB — CBC
HCT: 26.5 % — ABNORMAL LOW (ref 39.0–52.0)
Hemoglobin: 9.7 g/dL — ABNORMAL LOW (ref 13.0–17.0)
MCH: 34.5 pg — ABNORMAL HIGH (ref 26.0–34.0)
MCHC: 36.6 g/dL — ABNORMAL HIGH (ref 30.0–36.0)
MCV: 94.3 fL (ref 80.0–100.0)
Platelets: 442 10*3/uL — ABNORMAL HIGH (ref 150–400)
RBC: 2.81 MIL/uL — ABNORMAL LOW (ref 4.22–5.81)
RDW: 13.6 % (ref 11.5–15.5)
WBC: 18 10*3/uL — ABNORMAL HIGH (ref 4.0–10.5)
nRBC: 0.4 % — ABNORMAL HIGH (ref 0.0–0.2)

## 2023-01-23 LAB — BASIC METABOLIC PANEL
Anion gap: 8 (ref 5–15)
BUN: 6 mg/dL (ref 6–20)
CO2: 24 mmol/L (ref 22–32)
Calcium: 7.8 mg/dL — ABNORMAL LOW (ref 8.9–10.3)
Chloride: 92 mmol/L — ABNORMAL LOW (ref 98–111)
Creatinine, Ser: 0.73 mg/dL (ref 0.61–1.24)
GFR, Estimated: >60 mL/min (ref >60)
Glucose, Bld: 133 mg/dL — ABNORMAL HIGH (ref 70–99)
Potassium: 3.2 mmol/L — ABNORMAL LOW (ref 3.5–5.1)
Sodium: 124 mmol/L — ABNORMAL LOW (ref 135–145)

## 2023-01-23 LAB — HEPATIC FUNCTION PANEL
ALT: 70 U/L — ABNORMAL HIGH (ref 0–44)
AST: 47 U/L — ABNORMAL HIGH (ref 15–41)
Albumin: 2.7 g/dL — ABNORMAL LOW (ref 3.5–5.0)
Alkaline Phosphatase: 63 U/L (ref 38–126)
Bilirubin, Direct: 0.3 mg/dL — ABNORMAL HIGH (ref 0.0–0.2)
Indirect Bilirubin: 0.7 mg/dL (ref 0.3–0.9)
Total Bilirubin: 1 mg/dL (ref <1.2)
Total Protein: 5.6 g/dL — ABNORMAL LOW (ref 6.5–8.1)

## 2023-01-23 LAB — PHOSPHORUS: Phosphorus: 3.3 mg/dL (ref 2.5–4.6)

## 2023-01-23 LAB — CK: Total CK: 430 U/L — ABNORMAL HIGH (ref 49–397)

## 2023-01-23 LAB — GLUCOSE, CAPILLARY
Glucose-Capillary: 153 mg/dL — ABNORMAL HIGH (ref 70–99)
Glucose-Capillary: 120 mg/dL — ABNORMAL HIGH (ref 70–99)
Glucose-Capillary: 175 mg/dL — ABNORMAL HIGH (ref 70–99)
Glucose-Capillary: 138 mg/dL — ABNORMAL HIGH (ref 70–99)

## 2023-01-23 LAB — MAGNESIUM: Magnesium: 1.7 mg/dL (ref 1.7–2.4)

## 2023-01-23 LAB — PROCALCITONIN: Procalcitonin: 0.96 ng/mL

## 2023-01-23 MED ORDER — POTASSIUM CHLORIDE CRYS ER 20 MEQ PO TBCR
40.0000 meq | EXTENDED_RELEASE_TABLET | ORAL | Status: AC
  Administered 2023-01-23 (×2): 40 meq via ORAL
  Filled 2023-01-23 (×2): qty 2

## 2023-01-23 MED ORDER — NEPRO/CARBSTEADY PO LIQD
237.0000 mL | Freq: Three times a day (TID) | ORAL | Status: DC
  Administered 2023-01-23 – 2023-01-29 (×13): 237 mL via ORAL

## 2023-01-23 MED ORDER — METOPROLOL TARTRATE 25 MG PO TABS
12.5000 mg | ORAL_TABLET | Freq: Two times a day (BID) | ORAL | Status: DC
  Administered 2023-01-23 – 2023-02-04 (×25): 12.5 mg via ORAL
  Filled 2023-01-23 (×25): qty 1

## 2023-01-23 MED ORDER — POTASSIUM CHLORIDE CRYS ER 20 MEQ PO TBCR: 40.0000 meq | EXTENDED_RELEASE_TABLET | Freq: Once | ORAL | Status: DC

## 2023-01-23 MED ORDER — SODIUM CHLORIDE 1 G PO TABS
2.0000 g | ORAL_TABLET | Freq: Two times a day (BID) | ORAL | Status: AC
  Administered 2023-01-23 – 2023-01-25 (×5): 2 g via ORAL
  Filled 2023-01-23 (×6): qty 2

## 2023-01-23 MED ORDER — MAGNESIUM SULFATE 2 GM/50ML IV SOLN
2.0000 g | Freq: Once | INTRAVENOUS | Status: AC
  Administered 2023-01-23: 2 g via INTRAVENOUS
  Filled 2023-01-23 (×2): qty 50

## 2023-01-23 NOTE — Progress Notes (Signed)
Attending Progress Note  History: Samuel Riley is here for subdural hematoma status POD 6 s/p left-sided craniotomy for subdural hematoma evacuation, has been in the ICU postoperatively, now under the care of the hospitalist team.   In good spirits this morning. Pain is under control.  Physical Exam: Vitals:   01/23/23 0709 01/23/23 1000  BP: 119/80 111/77  Pulse: (!) 106   Resp:    Temp: 98.5 F (36.9 C)   SpO2: 98%     Awakes easily to voice, oriented to situation and location CNI, periorbital ecchymosis  Moving all 4 extremities to command  Data:  Recent Labs  Lab 01/21/23 0332 01/22/23 0441 01/23/23 0545  NA 129* 127* 124*  K 3.2* 3.7 3.2*  CL 95* 96* 92*  CO2 26 25 24   BUN 8 8 6   CREATININE 0.57* 0.68 0.73  GLUCOSE 124* 188* 133*  CALCIUM 7.5* 7.8* 7.8*   Recent Labs  Lab 01/23/23 0545  AST 47*  ALT 70*  ALKPHOS 63     Recent Labs  Lab 01/21/23 0332 01/22/23 0441 01/23/23 0545  WBC 13.5* 13.8* 18.0*  HGB 9.6* 9.4* 9.7*  HCT 26.2* 26.0* 26.5*  PLT 181 302 442*   Recent Labs  Lab 01/17/23 1550 01/18/23 0229 01/19/23 0340 01/20/23 0529  APTT 23*  --   --   --   INR 1.0 1.2 1.0 1.1         Other tests/results: No new cranial imaging  Assessment/Plan:  Samuel Riley 52 year old man with a history of chronic alcoholism who was admitted with a large left-sided subdural hematoma.  He had a subdural hematoma evacuation on 01/17/2023.  He is currently postoperative day 6 -Keep wound clean and dry. - pain control - DVT prophylaxis - PTOT -Continue daily CBCs, ideally sodium will be around 130 or greater given his history of hyponatremia and alcoholism, generally we like these patients to be above 135 but given his nutrition and alcoholism will except sodiums 128 or higher.  Joan Flores, PA-C Department of Neurosurgery

## 2023-01-23 NOTE — Progress Notes (Signed)
PHARMACY CONSULT NOTE - ELECTROLYTES  Pharmacy Consult for Electrolyte Monitoring and Replacement   Recent Labs: Height: 5\' 8"  (172.7 cm) Weight: 67.7 kg (149 lb 4 oz) IBW/kg (Calculated) : 68.4 Estimated Creatinine Clearance: 102.3 mL/min (by C-G formula based on SCr of 0.73 mg/dL). Potassium (mmol/L)  Date Value  01/23/2023 3.2 (L)   Magnesium (mg/dL)  Date Value  04/54/0981 1.7   Calcium (mg/dL)  Date Value  19/14/7829 7.8 (L)   Albumin (g/dL)  Date Value  56/21/3086 2.7 (L)   Phosphorus (mg/dL)  Date Value  57/84/6962 3.3   Sodium (mmol/L)  Date Value  01/23/2023 124 (L)   Corrected Ca: 8.7 mg/dL  Assessment  Samuel Riley is a 51 y.o. male presenting with traumatic SDH w/ midline shift now s/p craniotomy w/ evacuation. PMH significant for alcohol use disorder. Pharmacy has been consulted to monitor and replace electrolytes.   Goal of Therapy: Electrolytes WNL  Plan:  40 meq po KCl x 3 per MD 2 grams IV magnesium sulfate x 1 Because this consult was generated as part of an ICU order set and patient is transferring pharmacy will sign off for now Please feel free to reach out if any further assistance is needed  Thank you for allowing pharmacy to be a part of this patient's care.  Lowella Bandy, PharmD Clinical Pharmacist 01/23/2023 8:12 AM
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
## 2023-01-23 NOTE — Progress Notes (Signed)
Occupational Therapy Treatment Patient Details Name: Samuel Riley MRN: [id number] DOB: 12-20-1970 Today's Date: 01/23/2023   History of present illness Pt is a 52 year old male presenting to the ED with alcohol intoxication and multiple falls while intoxicated. Pt is admitted with subdural hematoma, status post fall S/p left craniotomy for hematoma evacuation done on 11/21   OT comments  Chart reviewed to date, pt greeted in bed, alert, oriented to self, place, date, situation. Poor safety and situational awareness noted throughout. Pt is making progress towards goal acquisition however continues to require supervision for bed mobility, CGA-MIN A for STS, amb to sink with MIN A and completes standing grooming tasks at sink level with CGA-MIN A due to one posterior LOB. Pt is also noted to be incontinent of urine. After discussion with son re: PLOF, discharge has been updated to reflect current level of functioning vs PLOF. Pt would benefit from skilled OT >3 hrs to facilitate optimal ADL completion, return to PLOF. OT will follow acutely.       If plan is discharge home, recommend the following:  A little help with walking and/or transfers;A little help with bathing/dressing/bathroom;Assistance with cooking/housework;Direct supervision/assist for medications management;Assist for transportation;Supervision due to cognitive status;Help with stairs or ramp for entrance;Direct supervision/assist for financial management   Equipment Recommendations  None recommended by OT    Recommendations for Other Services Rehab consult    Precautions / Restrictions Precautions Precautions: Fall Precaution Comments: impulsive Restrictions Weight Bearing Restrictions: No       Mobility Bed Mobility Overal bed mobility: Needs Assistance Bed Mobility: Supine to Sit, Sit to Supine     Supine to sit: Supervision Sit to supine: Supervision        Transfers Overall transfer level: Needs  assistance Equipment used: None Transfers: Sit to/from Stand Sit to Stand: Contact guard assist, Min assist                 Balance Overall balance assessment: Needs assistance Sitting-balance support: Feet supported Sitting balance-Leahy Scale: Good     Standing balance support: Single extremity supported Standing balance-Leahy Scale: Poor                             ADL either performed or assessed with clinical judgement   ADL Overall ADL's : Needs assistance/impaired     Grooming: Oral care;Standing;Contact guard assist-MIN A for one posterior LOB  Grooming Details (indicate cue type and reason): at sink level  Frequent vcs for optimal upright positioning            Lower Body Dressing: Supervision/safety   Toilet Transfer: Minimal assistance Toilet Transfer Details (indicate cue type and reason): amb with no AD, intermittent vcs for safety Toileting- Clothing Manipulation and Hygiene: Minimal assistance Toileting - Clothing Manipulation Details (indicate cue type and reason): pt noted to be incontient of urine     Functional mobility during ADLs: Minimal assistance (approx 20' in room with no AD)      Extremity/Trunk Assessment              Vision       Perception     Praxis      Cognition Arousal: Alert Behavior During Therapy: Impulsive Overall Cognitive Status: Impaired/Different from baseline Area of Impairment: Attention, Memory, Following commands, Safety/judgement, Awareness, Problem solving                   Current Attention Level:  Sustained Memory: Decreased short-term memory Following Commands: Follows one step commands consistently Safety/Judgement: Decreased awareness of safety, Decreased awareness of deficits Awareness: Emergent Problem Solving: Slow processing, Requires verbal cues, Decreased initiation General Comments: pt reports "I would call my brother" when asked who he would cal lif there is a Medical laboratory scientific officer Other Exercises Other Exercises: edu re: role of OT, role of rehab    Shoulder Instructions       General Comments HR 120s with mobility, all other vitals stable throughout    Pertinent Vitals/ Pain       Pain Assessment Pain Assessment: No/denies pain  Home Living                                          Prior Functioning/Environment              Frequency  Min 1X/week        Progress Toward Goals  OT Goals(current goals can now be found in the care plan section)  Progress towards OT goals: Progressing toward goals  Acute Rehab OT Goals Time For Goal Achievement: 02/05/23  Plan      Co-evaluation                 AM-PAC OT "6 Clicks" Daily Activity     Outcome Measure   Help from another person eating meals?: None Help from another person taking care of personal grooming?: None Help from another person toileting, which includes using toliet, bedpan, or urinal?: A Lot Help from another person bathing (including washing, rinsing, drying)?: A Lot Help from another person to put on and taking off regular upper body clothing?: None Help from another person to put on and taking off regular lower body clothing?: None 6 Click Score: 20    End of Session Equipment Utilized During Treatment: Gait belt  OT Visit Diagnosis: Other abnormalities of gait and mobility (R26.89);Other symptoms and signs involving cognitive function   Activity Tolerance Patient tolerated treatment well   Patient Left in bed;with call bell/phone within reach;with bed alarm set;with nursing/sitter in room;with family/visitor present (son present)   Nurse Communication          Time: 1040-1105 OT Time Calculation (min): 25 min  Charges: OT General Charges $OT Visit: 1 Visit OT Treatments $Self Care/Home Management : 8-22 mins $Therapeutic Activity: 8-22 mins  Oleta Mouse, OTD OTR/L  01/23/23, 12:59 PM

## 2023-01-23 NOTE — Progress Notes (Signed)
Patient states that he is going home to take a shower and then come back to do what he needs to do with PT and the other people to help him. I explained what time it was and that if he left right now, it would be against medical advice. He stated that we were too strict. I told him that the doctor would be here later on in the morning and to ask about moving to a room with a shower or discharge.

## 2023-01-23 NOTE — Progress Notes (Signed)
   Inpatient Rehab Admissions Coordinator :  Per therapy change in recommendations and concerns for decreased safety awareness, patient was screened for CIR candidacy by Ottie Glazier RN MSN.  At this time patient appears to be a potential candidate for CIR. I will place a rehab consult per protocol for full assessment. Please call me with any questions.  Ottie Glazier RN MSN Admissions Coordinator [phone]
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
## 2023-01-23 NOTE — Progress Notes (Signed)
Physical Therapy Treatment Patient Details Name: Samuel Riley MRN: [id number] DOB: 10-10-1970 Today's Date: 01/23/2023   History of Present Illness Pt is a 52 year old male presenting to the ED with alcohol intoxication and multiple falls while intoxicated. Pt is admitted with subdural hematoma, status post fall S/p left craniotomy for hematoma evacuation done on 11/21    PT Comments  Patient continues to demonstrate poor safety awareness and insight into deficits. Requires up to minA during ambulation with no AD to prevent fall as patient with frequent LOB. Improved balance with HHAx1. Would benefit from intensive rehab to improve balance and overall mobility to return to independence as patient was independent prior to admission. Updated discharge recommendation to reflect functional status.    If plan is discharge home, recommend the following: Assistance with cooking/housework;Assist for transportation;Help with stairs or ramp for entrance;Supervision due to cognitive status   Can travel by private vehicle        Equipment Recommendations  None recommended by PT    Recommendations for Other Services Rehab consult     Precautions / Restrictions Precautions Precautions: Fall Precaution Comments: impulsive Restrictions Weight Bearing Restrictions: No     Mobility  Bed Mobility Overal bed mobility: Needs Assistance Bed Mobility: Supine to Sit     Supine to sit: Supervision          Transfers Overall transfer level: Needs assistance Equipment used: None Transfers: Sit to/from Stand Sit to Stand: Contact guard assist                Ambulation/Gait Ambulation/Gait assistance: Contact guard assist, Min assist Gait Distance (Feet): 300 Feet Assistive device: 1 person hand held assist, None Gait Pattern/deviations: Step-through pattern, Decreased stride length, Wide base of support Gait velocity: decreased     General Gait Details: LOB x 4-5 during  mobility requiring minA to recover. Min guard with HHAx1 but increased assistance with No AD   Stairs             Wheelchair Mobility     Tilt Bed    Modified Rankin (Stroke Patients Only)       Balance Overall balance assessment: Needs assistance Sitting-balance support: Feet supported Sitting balance-Leahy Scale: Fair     Standing balance support: Single extremity supported Standing balance-Leahy Scale: Poor                              Cognition Arousal: Alert Behavior During Therapy: Impulsive Overall Cognitive Status: Impaired/Different from baseline Area of Impairment: Attention, Memory, Following commands, Safety/judgement, Awareness                   Current Attention Level: Sustained Memory: Decreased short-term memory Following Commands: Follows one step commands with increased time Safety/Judgement: Decreased awareness of safety, Decreased awareness of deficits Awareness: Emergent Problem Solving: Slow processing, Requires verbal cues, Decreased initiation General Comments: impulsive with decreased awareness of deficits        Exercises      General Comments General comments (skin integrity, edema, etc.): HR 120s with mobility, all other vitals stable throughout      Pertinent Vitals/Pain Pain Assessment Pain Assessment: No/denies pain    Home Living                          Prior Function            PT Goals (current goals can  now be found in the care plan section) Acute Rehab PT Goals Patient Stated Goal: to go home as soon as possible PT Goal Formulation: With patient Time For Goal Achievement: 02/05/23 Potential to Achieve Goals: Fair Progress towards PT goals: Progressing toward goals    Frequency    Min 1X/week      PT Plan      Co-evaluation              AM-PAC PT "6 Clicks" Mobility   Outcome Measure  Help needed turning from your back to your side while in a flat bed without  using bedrails?: None Help needed moving from lying on your back to sitting on the side of a flat bed without using bedrails?: A Little Help needed moving to and from a bed to a chair (including a wheelchair)?: A Little Help needed standing up from a chair using your arms (e.g., wheelchair or bedside chair)?: A Little Help needed to walk in hospital room?: A Little Help needed climbing 3-5 steps with a railing? : A Little 6 Click Score: 19    End of Session Equipment Utilized During Treatment: Gait belt Activity Tolerance: Patient tolerated treatment well;Patient limited by fatigue Patient left: in bed;with call bell/phone within reach;with family/visitor present;with nursing/sitter in room Nurse Communication: Mobility status PT Visit Diagnosis: Muscle weakness (generalized) (M62.81);Unsteadiness on feet (R26.81)     Time: 5784-6962 PT Time Calculation (min) (ACUTE ONLY): 21 min  Charges:    $Therapeutic Activity: 8-22 mins PT General Charges $$ ACUTE PT VISIT: 1 Visit                     Maylon Peppers, PT, DPT Physical Therapist - [facility]  [facility]    Samuel Riley A Samuel Riley 01/23/2023, 12:50 PM

## 2023-01-23 NOTE — Progress Notes (Signed)
Inpatient Rehab Coordinator Note:  I spoke with patient, his son Terese Door) and brother Caryn Bee) over the phone to discuss CIR recommendations and goals/expectations of CIR stay.  We reviewed 3 hrs/day of therapy, physician follow up, and average length of stay 2 weeks (dependent upon progress) with goals of supervision.  We discussed that patient will likely need 24/7 supervision initially and that family, in consultation with their NS team, can determine when it is safe to reduce that.  We reviewed need for prior auth from [facility] and I will request that today.  We also reviewed that [employer] is closed Thu-Sun for the holiday.  If I hear back from them today I could likely admit Friday if bed available and pt is ready.  Otherwise I will f/u with pt/family and therapy on Monday to see how things are going.   Estill Dooms, PT, DPT Admissions Coordinator [phone] 01/23/23  3:57 PM

## 2023-01-24 DIAGNOSIS — S065XAA Traumatic subdural hemorrhage with loss of consciousness status unknown, initial encounter: Secondary | ICD-10-CM | POA: Diagnosis not present

## 2023-01-24 LAB — HEPATIC FUNCTION PANEL
ALT: 57 U/L — ABNORMAL HIGH (ref 0–44)
AST: 34 U/L (ref 15–41)
Albumin: 2.9 g/dL — ABNORMAL LOW (ref 3.5–5.0)
Alkaline Phosphatase: 70 U/L (ref 38–126)
Bilirubin, Direct: 0.2 mg/dL (ref 0.0–0.2)
Indirect Bilirubin: 0.7 mg/dL (ref 0.3–0.9)
Total Bilirubin: 0.9 mg/dL (ref <1.2)
Total Protein: 5.6 g/dL — ABNORMAL LOW (ref 6.5–8.1)

## 2023-01-24 LAB — BASIC METABOLIC PANEL
Anion gap: 8 (ref 5–15)
BUN: 6 mg/dL (ref 6–20)
CO2: 27 mmol/L (ref 22–32)
Calcium: 8 mg/dL — ABNORMAL LOW (ref 8.9–10.3)
Chloride: 95 mmol/L — ABNORMAL LOW (ref 98–111)
Creatinine, Ser: 0.65 mg/dL (ref 0.61–1.24)
GFR, Estimated: >60 mL/min (ref >60)
Glucose, Bld: 149 mg/dL — ABNORMAL HIGH (ref 70–99)
Potassium: 3.8 mmol/L (ref 3.5–5.1)
Sodium: 130 mmol/L — ABNORMAL LOW (ref 135–145)

## 2023-01-24 LAB — CBC
HCT: 25.7 % — ABNORMAL LOW (ref 39.0–52.0)
Hemoglobin: 9.4 g/dL — ABNORMAL LOW (ref 13.0–17.0)
MCH: 35.2 pg — ABNORMAL HIGH (ref 26.0–34.0)
MCHC: 36.6 g/dL — ABNORMAL HIGH (ref 30.0–36.0)
MCV: 96.3 fL (ref 80.0–100.0)
Platelets: 580 10*3/uL — ABNORMAL HIGH (ref 150–400)
RBC: 2.67 MIL/uL — ABNORMAL LOW (ref 4.22–5.81)
RDW: 14.3 % (ref 11.5–15.5)
WBC: 19.9 10*3/uL — ABNORMAL HIGH (ref 4.0–10.5)
nRBC: 0.1 % (ref 0.0–0.2)

## 2023-01-24 LAB — PROCALCITONIN: Procalcitonin: 0.44 ng/mL

## 2023-01-24 LAB — GLUCOSE, CAPILLARY
Glucose-Capillary: 174 mg/dL — ABNORMAL HIGH (ref 70–99)
Glucose-Capillary: 171 mg/dL — ABNORMAL HIGH (ref 70–99)
Glucose-Capillary: 126 mg/dL — ABNORMAL HIGH (ref 70–99)
Glucose-Capillary: 165 mg/dL — ABNORMAL HIGH (ref 70–99)

## 2023-01-24 LAB — MAGNESIUM: Magnesium: 2.4 mg/dL (ref 1.7–2.4)

## 2023-01-24 LAB — PHOSPHORUS: Phosphorus: 2.9 mg/dL (ref 2.5–4.6)

## 2023-01-24 MED ORDER — LEVETIRACETAM 500 MG PO TABS
500.0000 mg | ORAL_TABLET | Freq: Two times a day (BID) | ORAL | Status: AC
  Administered 2023-01-24 – 2023-01-25 (×3): 500 mg via ORAL
  Filled 2023-01-24 (×3): qty 1

## 2023-01-24 NOTE — Plan of Care (Signed)
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
## 2023-01-24 NOTE — Plan of Care (Signed)
  Problem: Clinical Measurements: Goal: Ability to maintain clinical measurements within normal limits will improve Outcome: Progressing   Problem: Pain Management: Goal: General experience of comfort will improve Outcome: Progressing   Problem: Safety: Goal: Ability to remain free from injury will improve Outcome: Progressing   Problem: Skin Integrity: Goal: Risk for impaired skin integrity will decrease Outcome: Progressing   Problem: Education: Goal: Ability to describe self-care measures that may prevent or decrease complications (Diabetes Survival Skills Education) will improve Outcome: Progressing

## 2023-01-25 DIAGNOSIS — L8990 Pressure ulcer of unspecified site, unspecified stage: Secondary | ICD-10-CM | POA: Insufficient documentation

## 2023-01-25 DIAGNOSIS — S065XAA Traumatic subdural hemorrhage with loss of consciousness status unknown, initial encounter: Secondary | ICD-10-CM | POA: Diagnosis not present

## 2023-01-25 DIAGNOSIS — F10920 Alcohol use, unspecified with intoxication, uncomplicated: Secondary | ICD-10-CM | POA: Diagnosis not present

## 2023-01-25 DIAGNOSIS — F1020 Alcohol dependence, uncomplicated: Secondary | ICD-10-CM | POA: Diagnosis present

## 2023-01-25 LAB — CBC
HCT: 27.4 % — ABNORMAL LOW (ref 39.0–52.0)
Hemoglobin: 9.6 g/dL — ABNORMAL LOW (ref 13.0–17.0)
MCH: 34 pg (ref 26.0–34.0)
MCHC: 35 g/dL (ref 30.0–36.0)
MCV: 97.2 fL (ref 80.0–100.0)
Platelets: 797 10*3/uL — ABNORMAL HIGH (ref 150–400)
RBC: 2.82 MIL/uL — ABNORMAL LOW (ref 4.22–5.81)
RDW: 14.2 % (ref 11.5–15.5)
WBC: 21.8 10*3/uL — ABNORMAL HIGH (ref 4.0–10.5)
nRBC: 0.1 % (ref 0.0–0.2)

## 2023-01-25 LAB — VALPROIC ACID LEVEL: Valproic Acid Lvl: 41 ug/mL — ABNORMAL LOW (ref 50.0–100.0)

## 2023-01-25 LAB — BASIC METABOLIC PANEL
Anion gap: 8 (ref 5–15)
BUN: 5 mg/dL — ABNORMAL LOW (ref 6–20)
CO2: 26 mmol/L (ref 22–32)
Calcium: 7.8 mg/dL — ABNORMAL LOW (ref 8.9–10.3)
Chloride: 96 mmol/L — ABNORMAL LOW (ref 98–111)
Creatinine, Ser: 0.63 mg/dL (ref 0.61–1.24)
GFR, Estimated: >60 mL/min (ref >60)
Glucose, Bld: 107 mg/dL — ABNORMAL HIGH (ref 70–99)
Potassium: 3.7 mmol/L (ref 3.5–5.1)
Sodium: 130 mmol/L — ABNORMAL LOW (ref 135–145)

## 2023-01-25 LAB — GLUCOSE, CAPILLARY
Glucose-Capillary: 140 mg/dL — ABNORMAL HIGH (ref 70–99)
Glucose-Capillary: 161 mg/dL — ABNORMAL HIGH (ref 70–99)

## 2023-01-25 LAB — HEPATIC FUNCTION PANEL
ALT: 44 U/L (ref 0–44)
AST: 27 U/L (ref 15–41)
Albumin: 2.7 g/dL — ABNORMAL LOW (ref 3.5–5.0)
Alkaline Phosphatase: 77 U/L (ref 38–126)
Bilirubin, Direct: 0.1 mg/dL (ref 0.0–0.2)
Indirect Bilirubin: 0.7 mg/dL (ref 0.3–0.9)
Total Bilirubin: 0.8 mg/dL (ref <1.2)
Total Protein: 5.5 g/dL — ABNORMAL LOW (ref 6.5–8.1)

## 2023-01-25 LAB — PHOSPHORUS: Phosphorus: 3.2 mg/dL (ref 2.5–4.6)

## 2023-01-25 LAB — PROCALCITONIN: Procalcitonin: 0.41 ng/mL

## 2023-01-25 LAB — MAGNESIUM: Magnesium: 2.2 mg/dL (ref 1.7–2.4)

## 2023-01-25 MED ORDER — ACETAMINOPHEN 500 MG PO TABS
1000.0000 mg | ORAL_TABLET | Freq: Four times a day (QID) | ORAL | Status: DC | PRN
  Administered 2023-01-25 – 2023-01-26 (×4): 1000 mg via ORAL
  Filled 2023-01-25 (×4): qty 2

## 2023-01-25 MED ORDER — ACETAMINOPHEN 10 MG/ML IV SOLN
1000.0000 mg | Freq: Four times a day (QID) | INTRAVENOUS | Status: DC
  Filled 2023-01-25 (×2): qty 100

## 2023-01-25 NOTE — TOC Progression Note (Signed)
Transition of Care [facility]) - Progression Note    Patient Details  Name: Samuel Riley MRN: [id number] Date of Birth: 12-Apr-1970  Transition of Care [facility]) CM/SW Contact  Truddie Hidden, RN Phone Number: 01/25/2023, 1:24 PM  Clinical Narrative:     TOC continuing to follow patient's progress throughout discharge planning.   Expected Discharge Plan: Home/Self Care Barriers to Discharge: Continued Medical Work up  Expected Discharge Plan and Services                                               Social Determinants of Health (SDOH) Interventions SDOH Screenings   Depression (PHQ2-9): Low Risk  (08/08/2021)  Tobacco Use: High Risk (01/17/2023)    Readmission Risk Interventions     No data to display

## 2023-01-25 NOTE — Consult Note (Signed)
[facility] Face-to-Face Psychiatry Consult   Reason for Consult:  capacity Referring Physician:  Dr Lucianne Muss Patient Identification: Samuel Riley MRN:  [id number] Principal Diagnosis: Subdural hematoma [facility]) Diagnosis:  Principal Problem:   Subdural hematoma (HCC) Active Problems:   Alcohol abuse   Right sided weakness   Traumatic brain compression with herniation, initial encounter (HCC)   Alcoholic intoxication without complication (HCC)   Alcohol use disorder, severe, dependence (HCC)   Pressure injury of skin   Total Time spent with patient: 45 minutes  Subjective:   Samuel Riley is a 52 y.o. male patient admitted with alcohol intoxication and fall, complications.  HPI:  52 yo male admitted after drinking heavily and falling.  Today, he awakened easily with his sitter reporting he does not sleep well at night.  He was pleasant on assessment and stated when asked what brought him to the hospital, "I had a ....what's the word", supplied the word fall and he confirmed yes.  He did provide that he has worked at [employer] group for 30 years and lives along.  Denies depression and anxiety.  When asked about alcohol  use he said, "I don't drink every day" but lately he was drinking a 1/5 of alcohol daily.  Denies withdrawal symptoms on assessment, multiple bruises on his body.  When asked what he would do if he fell at home or got sick, he stated, "Call 911".  No hallucinations, paranoia, homicidal ideations, or other drug use.  Encouraged rehab to which he was not interested as he has "done that".  Then, replied with "I have something scheduled with my son at [facility] in [place] because I'm not cognitively there."  This provider inquired if he was agreeable to this transfer, he stated, "100%".  No concerns voiced.  The client does have capacity to make his own medical decisions and appears to be making good choices to transfer to rehab.  Past Psychiatric History: alcohol dependence, depression  Risk  to Self:  none Risk to Others:  none Prior Inpatient Therapy:  rehab Prior Outpatient Therapy:  denies  Past Medical History:  Past Medical History:  Diagnosis Date   Alcohol abuse    Anxiety    Atypical mole 02/10/2015   Right supra auricular scalp. Mild atypia, deep margin involved.    Basal cell carcinoma 03/14/2006   Mid vertex scalp.    Basal cell carcinoma 03/26/2007   Vertex scalp.    Basal cell carcinoma 01/11/2010   Scalp.    Basal cell carcinoma 11/05/2013   Left anterior deltoid. Superficial. EDC.   Basal cell carcinoma 10/20/2014   Left superior pectoral. Nodular.   Basal cell carcinoma 10/30/2022   Right vertex scalp. Nodular, infiltrative pattern. Mohs pending   Dyslipidemia    Dysplastic nevus 03/06/2017   Right proximal ant. lat. thigh. Mild atypia, limited margins free.   Dysplastic nevus 03/06/2017   Right mid ant. thigh. Mild atypia, limited margins free.   Dysplastic nevus 03/24/2019   Left low back paraspinal. Moderate atypia, close to margin.   Monocytosis     Past Surgical History:  Procedure Laterality Date   COLONOSCOPY WITH PROPOFOL N/A 03/17/2021   Procedure: COLONOSCOPY WITH PROPOFOL;  Surgeon: Wyline Mood, MD;  Location: [facility] ENDOSCOPY;  Service: Gastroenterology;  Laterality: N/A;   CRANIOTOMY Left 01/17/2023   Procedure: CRANIOTOMY HEMATOMA EVACUATION SUBDURAL;  Surgeon: Lovenia Kim, MD;  Location: [facility] ORS;  Service: Neurosurgery;  Laterality: Left;   SPLENECTOMY, TOTAL N/A 2000   [facility]  Family History:  Family History  Problem Relation Age of Onset   Hypertension Father    Prostate cancer Father    Hypertension Brother    Family Psychiatric  History: none Social History:  Social History   Substance and Sexual Activity  Alcohol Use Yes   Comment: Fifth Per Day     Social History   Substance and Sexual Activity  Drug Use Not Currently   Types: Cocaine    Social History   Socioeconomic History   Marital status:  Divorced    Spouse name: Not on file   Number of children: 1   Years of education: Not on file   Highest education level: Not on file  Occupational History   Occupation: Psychologist, sport and exercise  Tobacco Use   Smoking status: Every Day    Current packs/day: 0.50    Average packs/day: 0.5 packs/day for 30.0 years (15.0 ttl pk-yrs)    Types: Cigarettes   Smokeless tobacco: Never  Vaping Use   Vaping status: Never Used  Substance and Sexual Activity   Alcohol use: Yes    Comment: Fifth Per Day   Drug use: Not Currently    Types: Cocaine   Sexual activity: Yes    Partners: Female    Birth control/protection: Condom  Other Topics Concern   Not on file  Social History Narrative   Not on file   Social Determinants of Health   Financial Resource Strain: Not on file  Food Insecurity: Not on file  Transportation Needs: Not on file  Physical Activity: Not on file  Stress: Not on file  Social Connections: Not on file   Additional Social History:    Allergies:  No Known Allergies  Labs:  Results for orders placed or performed during the hospital encounter of 01/17/23 (from the past 48 hour(s))  Glucose, capillary     Status: Abnormal   Collection Time: 01/23/23 11:12 AM  Result Value Ref Range   Glucose-Capillary 120 (H) 70 - 99 mg/dL    Comment: Glucose reference range applies only to samples taken after fasting for at least 8 hours.  Glucose, capillary     Status: Abnormal   Collection Time: 01/23/23  5:53 PM  Result Value Ref Range   Glucose-Capillary 175 (H) 70 - 99 mg/dL    Comment: Glucose reference range applies only to samples taken after fasting for at least 8 hours.  Glucose, capillary     Status: Abnormal   Collection Time: 01/23/23  9:02 PM  Result Value Ref Range   Glucose-Capillary 138 (H) 70 - 99 mg/dL    Comment: Glucose reference range applies only to samples taken after fasting for at least 8 hours.  Basic metabolic panel     Status: Abnormal   Collection Time:  01/24/23  4:38 AM  Result Value Ref Range   Sodium 130 (L) 135 - 145 mmol/L   Potassium 3.8 3.5 - 5.1 mmol/L   Chloride 95 (L) 98 - 111 mmol/L   CO2 27 22 - 32 mmol/L   Glucose, Bld 149 (H) 70 - 99 mg/dL    Comment: Glucose reference range applies only to samples taken after fasting for at least 8 hours.   BUN 6 6 - 20 mg/dL   Creatinine, Ser 2.72 0.61 - 1.24 mg/dL   Calcium 8.0 (L) 8.9 - 10.3 mg/dL   GFR, Estimated >53 >66 mL/min    Comment: (NOTE) Calculated using the CKD-EPI Creatinine Equation (2021)    Anion gap  8 5 - 15    Comment: Performed at [facility], [address] [place], [place] [address]  CBC     Status: Abnormal   Collection Time: 01/24/23  4:38 AM  Result Value Ref Range   WBC 19.9 (H) 4.0 - 10.5 K/uL   RBC 2.67 (L) 4.22 - 5.81 MIL/uL   Hemoglobin 9.4 (L) 13.0 - 17.0 g/dL   HCT 63.8 (L) 75.6 - 43.3 %   MCV 96.3 80.0 - 100.0 fL   MCH 35.2 (H) 26.0 - 34.0 pg   MCHC 36.6 (H) 30.0 - 36.0 g/dL   RDW 29.5 18.8 - 41.6 %   Platelets 580 (H) 150 - 400 K/uL   nRBC 0.1 0.0 - 0.2 %    Comment: Performed at [facility], [address] [place], [place] [address]  Hepatic function panel     Status: Abnormal   Collection Time: 01/24/23  4:38 AM  Result Value Ref Range   Total Protein 5.6 (L) 6.5 - 8.1 g/dL   Albumin 2.9 (L) 3.5 - 5.0 g/dL   AST 34 15 - 41 U/L   ALT 57 (H) 0 - 44 U/L   Alkaline Phosphatase 70 38 - 126 U/L   Total Bilirubin 0.9 <1.2 mg/dL   Bilirubin, Direct 0.2 0.0 - 0.2 mg/dL   Indirect Bilirubin 0.7 0.3 - 0.9 mg/dL    Comment: Performed at [facility], [address] [place], [place] [address]  Magnesium     Status: None   Collection Time: 01/24/23  4:38 AM  Result Value Ref Range   Magnesium 2.4 1.7 - 2.4 mg/dL    Comment: Performed at [facility], [address] [place], [place] [address]  Phosphorus     Status: None   Collection Time: 01/24/23  4:38 AM  Result Value Ref Range   Phosphorus 2.9  2.5 - 4.6 mg/dL    Comment: Performed at [facility], [address] [place], [place] [address]  Procalcitonin     Status: None   Collection Time: 01/24/23  4:38 AM  Result Value Ref Range   Procalcitonin 0.44 ng/mL    Comment:        Interpretation: PCT (Procalcitonin) <= 0.5 ng/mL: Systemic infection (sepsis) is not likely. Local bacterial infection is possible. (NOTE)       Sepsis PCT Algorithm           Lower Respiratory Tract                                      Infection PCT Algorithm    ----------------------------     ----------------------------         PCT < 0.25 ng/mL                PCT < 0.10 ng/mL          Strongly encourage             Strongly discourage   discontinuation of antibiotics    initiation of antibiotics    ----------------------------     -----------------------------       PCT 0.25 - 0.50 ng/mL            PCT 0.10 - 0.25 ng/mL               OR       >80% decrease in PCT  Discourage initiation of                                            antibiotics      Encourage discontinuation           of antibiotics    ----------------------------     -----------------------------         PCT >= 0.50 ng/mL              PCT 0.26 - 0.50 ng/mL               AND        <80% decrease in PCT             Encourage initiation of                                             antibiotics       Encourage continuation           of antibiotics    ----------------------------     -----------------------------        PCT >= 0.50 ng/mL                  PCT > 0.50 ng/mL               AND         increase in PCT                  Strongly encourage                                      initiation of antibiotics    Strongly encourage escalation           of antibiotics                                     -----------------------------                                           PCT <= 0.25 ng/mL                                                 OR                                         > 80% decrease in PCT                                      Discontinue / Do not initiate  antibiotics  Performed at [facility], [address] [place], [place] [address]   Glucose, capillary     Status: Abnormal   Collection Time: 01/24/23  8:01 AM  Result Value Ref Range   Glucose-Capillary 174 (H) 70 - 99 mg/dL    Comment: Glucose reference range applies only to samples taken after fasting for at least 8 hours.  Glucose, capillary     Status: Abnormal   Collection Time: 01/24/23 11:33 AM  Result Value Ref Range   Glucose-Capillary 171 (H) 70 - 99 mg/dL    Comment: Glucose reference range applies only to samples taken after fasting for at least 8 hours.  Glucose, capillary     Status: Abnormal   Collection Time: 01/24/23  4:25 PM  Result Value Ref Range   Glucose-Capillary 126 (H) 70 - 99 mg/dL    Comment: Glucose reference range applies only to samples taken after fasting for at least 8 hours.  Glucose, capillary     Status: Abnormal   Collection Time: 01/24/23  8:37 PM  Result Value Ref Range   Glucose-Capillary 165 (H) 70 - 99 mg/dL    Comment: Glucose reference range applies only to samples taken after fasting for at least 8 hours.  Basic metabolic panel     Status: Abnormal   Collection Time: 01/25/23  4:30 AM  Result Value Ref Range   Sodium 130 (L) 135 - 145 mmol/L   Potassium 3.7 3.5 - 5.1 mmol/L   Chloride 96 (L) 98 - 111 mmol/L   CO2 26 22 - 32 mmol/L   Glucose, Bld 107 (H) 70 - 99 mg/dL    Comment: Glucose reference range applies only to samples taken after fasting for at least 8 hours.   BUN 5 (L) 6 - 20 mg/dL   Creatinine, Ser 6.38 0.61 - 1.24 mg/dL   Calcium 7.8 (L) 8.9 - 10.3 mg/dL   GFR, Estimated >75 >64 mL/min    Comment: (NOTE) Calculated using the CKD-EPI Creatinine Equation (2021)    Anion gap 8 5 - 15    Comment: Performed at [facility], [address]  [place], [place] [address]  CBC     Status: Abnormal   Collection Time: 01/25/23  4:30 AM  Result Value Ref Range   WBC 21.8 (H) 4.0 - 10.5 K/uL   RBC 2.82 (L) 4.22 - 5.81 MIL/uL   Hemoglobin 9.6 (L) 13.0 - 17.0 g/dL   HCT 18.8 (L) 41.6 - 60.6 %   MCV 97.2 80.0 - 100.0 fL   MCH 34.0 26.0 - 34.0 pg   MCHC 35.0 30.0 - 36.0 g/dL   RDW 30.1 60.1 - 09.3 %   Platelets 797 (H) 150 - 400 K/uL   nRBC 0.1 0.0 - 0.2 %    Comment: Performed at [facility], [address] [place], [place] [address]  Hepatic function panel     Status: Abnormal   Collection Time: 01/25/23  4:30 AM  Result Value Ref Range   Total Protein 5.5 (L) 6.5 - 8.1 g/dL   Albumin 2.7 (L) 3.5 - 5.0 g/dL   AST 27 15 - 41 U/L   ALT 44 0 - 44 U/L   Alkaline Phosphatase 77 38 - 126 U/L   Total Bilirubin 0.8 <1.2 mg/dL   Bilirubin, Direct 0.1 0.0 - 0.2 mg/dL   Indirect Bilirubin 0.7 0.3 - 0.9 mg/dL    Comment: Performed at [facility], [address] [place],  [place] [address]  Magnesium     Status: None   Collection Time: 01/25/23  4:30 AM  Result Value Ref Range   Magnesium 2.2 1.7 - 2.4 mg/dL    Comment: Performed at [facility], [address] [place], [place] [address]  Phosphorus     Status: None   Collection Time: 01/25/23  4:30 AM  Result Value Ref Range   Phosphorus 3.2 2.5 - 4.6 mg/dL    Comment: Performed at [facility], [address] [place], [place] [address]  Procalcitonin     Status: None   Collection Time: 01/25/23  4:30 AM  Result Value Ref Range   Procalcitonin 0.41 ng/mL    Comment:        Interpretation: PCT (Procalcitonin) <= 0.5 ng/mL: Systemic infection (sepsis) is not likely. Local bacterial infection is possible. (NOTE)       Sepsis PCT Algorithm           Lower Respiratory Tract                                      Infection PCT Algorithm    ----------------------------     ----------------------------         PCT < 0.25 ng/mL                PCT <  0.10 ng/mL          Strongly encourage             Strongly discourage   discontinuation of antibiotics    initiation of antibiotics    ----------------------------     -----------------------------       PCT 0.25 - 0.50 ng/mL            PCT 0.10 - 0.25 ng/mL               OR       >80% decrease in PCT            Discourage initiation of                                            antibiotics      Encourage discontinuation           of antibiotics    ----------------------------     -----------------------------         PCT >= 0.50 ng/mL              PCT 0.26 - 0.50 ng/mL               AND        <80% decrease in PCT             Encourage initiation of                                             antibiotics       Encourage continuation           of antibiotics    ----------------------------     -----------------------------        PCT >= 0.50 ng/mL  PCT > 0.50 ng/mL               AND         increase in PCT                  Strongly encourage                                      initiation of antibiotics    Strongly encourage escalation           of antibiotics                                     -----------------------------                                           PCT <= 0.25 ng/mL                                                 OR                                        > 80% decrease in PCT                                      Discontinue / Do not initiate                                             antibiotics  Performed at [facility], [address] [place], [place] [address]   Valproic acid level     Status: Abnormal   Collection Time: 01/25/23  7:21 AM  Result Value Ref Range   Valproic Acid Lvl 41 (L) 50.0 - 100.0 ug/mL    Comment: Performed at [facility], [address] [place], [place] [address]    Current Facility-Administered Medications  Medication Dose Route Frequency Provider Last Rate Last Admin   acetaminophen  (TYLENOL) tablet 1,000 mg  1,000 mg Oral Q6H PRN Manuela Schwartz, NP   1,000 mg at 01/25/23 2725   ascorbic acid (VITAMIN C) tablet 500 mg  500 mg Oral BID Gillis Santa, MD   500 mg at 01/25/23 0901   chlordiazePOXIDE (LIBRIUM) capsule 5 mg  5 mg Oral Q1200 Gillis Santa, MD   5 mg at 01/24/23 1304   chlorproMAZINE (THORAZINE) tablet 50 mg  50 mg Oral TID PRN Jimmye Norman, NP   50 mg at 01/23/23 1034   diltiazem (CARDIZEM) tablet 30 mg  30 mg Oral Q8H PRN Gillis Santa, MD       divalproex (DEPAKOTE) DR tablet 500 mg  500 mg Oral QHS Barrie Folk, RPH   500 mg at 01/24/23 2038   divalproex (DEPAKOTE) DR tablet 750 mg  750 mg Oral  Daily Barrie Folk, RPH   750 mg at 01/25/23 0900   docusate sodium (COLACE) capsule 100 mg  100 mg Oral BID PRN Judithe Modest, NP       feeding supplement (NEPRO CARB STEADY) liquid 237 mL  237 mL Oral TID BM Gillis Santa, MD   237 mL at 01/24/23 2041   folic acid (FOLVITE) tablet 1 mg  1 mg Oral Daily Tressie Ellis, RPH   1 mg at 01/25/23 0900   insulin aspart (novoLOG) injection 0-9 Units  0-9 Units Subcutaneous TID AC & HS Jawo, Modou L, NP   2 Units at 01/25/23 0901   labetalol (NORMODYNE) injection 10-20 mg  10-20 mg Intravenous Q2H PRN Rust-Chester, Cecelia Byars, NP       levETIRAcetam (KEPPRA) tablet 500 mg  500 mg Oral BID Gillis Santa, MD   500 mg at 01/25/23 0900   lip balm (BLISTEX) ointment   Topical PRN Jimmye Norman, NP       metoprolol tartrate (LOPRESSOR) tablet 12.5 mg  12.5 mg Oral BID Gillis Santa, MD   12.5 mg at 01/25/23 0901   multivitamin with minerals tablet 1 tablet  1 tablet Oral Daily Tressie Ellis, RPH   1 tablet at 01/25/23 0900   ondansetron (ZOFRAN) injection 4 mg  4 mg Intravenous Q6H PRN Ezequiel Essex, NP   4 mg at 01/19/23 1239   Oral care mouth rinse  15 mL Mouth Rinse PRN Gillis Santa, MD       pantoprazole (PROTONIX) EC tablet 40 mg  40 mg Oral Daily Gillis Santa, MD   40 mg at 01/25/23  0901   polyethylene glycol (MIRALAX / GLYCOLAX) packet 17 g  17 g Oral Daily PRN Harlon Ditty D, NP       sodium chloride tablet 2 g  2 g Oral BID WC Gillis Santa, MD   2 g at 01/25/23 0900   thiamine (VITAMIN B1) tablet 100 mg  100 mg Oral Daily Lowella Bandy, RPH   100 mg at 01/25/23 0900   Vitamin D (Ergocalciferol) (DRISDOL) 1.25 MG (50000 UNIT) capsule 50,000 Units  50,000 Units Oral Q7 days Gillis Santa, MD   50,000 Units at 01/21/23 1610    Musculoskeletal: Strength & Muscle Tone: decreased Gait & Station:  did not witness Patient leans: N/A  Psychiatric Specialty Exam: Physical Exam Vitals and nursing note reviewed.  Constitutional:      Appearance: Normal appearance.  HENT:     Nose: Nose normal.  Pulmonary:     Effort: Pulmonary effort is normal.  Musculoskeletal:     Cervical back: Normal range of motion.  Neurological:     General: No focal deficit present.     Mental Status: He is alert and oriented to person, place, and time.     Review of Systems  Psychiatric/Behavioral:  Positive for substance abuse.   All other systems reviewed and are negative.   Blood pressure 122/77, pulse 95, temperature 97.8 F (36.6 C), temperature source Oral, resp. rate 16, height 5\' 8"  (1.727 m), weight 67.3 kg, SpO2 98%.Body mass index is 22.56 kg/m.  General Appearance: Casual  Eye Contact:  Good  Speech:  Normal Rate  Volume:  Normal  Mood:  Euthymic  Affect:  Congruent  Thought Process:  Coherent and Descriptions of Associations: Intact  Orientation:  Full (Time, Place, and Person)  Thought Content:  Logical  Suicidal Thoughts:  No  Homicidal Thoughts:  No  Memory:  Immediate;   Fair Recent;   Fair Remote;   Fair  Judgement:  Poor  Insight:  Lacking  Psychomotor Activity:  Normal  Concentration:  Concentration: Fair and Attention Span: Fair  Recall:  Samuel Riley of Knowledge:  Fair  Language:  Fair  Akathisia:  No  Handed:  Right  AIMS (if indicated):      Assets:  Leisure Time Physical Health Resilience Social Support  ADL's:  Intact  Cognition:  Impaired,  Mild  Sleep:        Physical Exam: Physical Exam Vitals and nursing note reviewed.  Constitutional:      Appearance: Normal appearance.  HENT:     Nose: Nose normal.  Pulmonary:     Effort: Pulmonary effort is normal.  Musculoskeletal:     Cervical back: Normal range of motion.  Neurological:     General: No focal deficit present.     Mental Status: He is alert and oriented to person, place, and time.    Review of Systems  Psychiatric/Behavioral:  Positive for substance abuse.   All other systems reviewed and are negative.  Blood pressure 122/77, pulse 95, temperature 97.8 F (36.6 C), temperature source Oral, resp. rate 16, height 5\' 8"  (1.727 m), weight 67.3 kg, SpO2 98%. Body mass index is 22.56 kg/m.  Treatment Plan Summary: Alcohol use disorder Recommend rehab, the client reports he is transferring to a rehab in [place] because "I'm not cognitively there". Taper in place  The client has capacity to make independent medical decisions. He does appear to be willing to work with his son and agreeable to transfer to [facility] rehab.  Disposition: No evidence of imminent risk to self or others at present.   Patient does not meet criteria for psychiatric inpatient admission. Supportive therapy provided about ongoing stressors.  Nanine Means, NP 01/25/2023 9:06 AM

## 2023-01-25 NOTE — Progress Notes (Signed)
Inpatient Rehab Admissions Coordinator:   I did receive insurance approval.  Note WBC trending up, no tacchycardia, no fever.  Discussed with rehab MDs (Dr. Riley Kill, Dr. Berline Chough, and Dr. Natale Lay) who feel with WBC trending up in pt s/p craniotomy he needs further infectious workup with WBC trending down in order to be comfortable accepting on CIR. I have notified Dr. Fran Lowes and I will update the pt/family as well.  I will f/u on Monday.   Estill Dooms, PT, DPT Admissions Coordinator [phone] 01/25/23  9:52 AM

## 2023-01-25 NOTE — Progress Notes (Signed)
Attending Progress Note  History: Samuel Riley is here for subdural hematoma status POD 7 s/p left-sided craniotomy for subdural hematoma evacuation, has been in the ICU postoperatively, now under the care of the hospitalist team.   This morning, he is awake, alert, and interactive. No significant headache. Tolerating diet  Physical Exam: Vitals:   01/25/23 1259 01/25/23 1713  BP: 120/75 137/80  Pulse: 95 (!) 101  Resp:  17  Temp:  98.2 F (36.8 C)  SpO2: 100% 99%    Awake and alert CNI, periorbital ecchymosis  Moving all 4 extremities to command Incision clean and dry  Data:  Recent Labs  Lab 01/23/23 0545 01/24/23 0438 01/25/23 0430  NA 124* 130* 130*  K 3.2* 3.8 3.7  CL 92* 95* 96*  CO2 24 27 26   BUN 6 6 5*  CREATININE 0.73 0.65 0.63  GLUCOSE 133* 149* 107*  CALCIUM 7.8* 8.0* 7.8*   Recent Labs  Lab 01/25/23 0430  AST 27  ALT 44  ALKPHOS 77     Recent Labs  Lab 01/23/23 0545 01/24/23 0438 01/25/23 0430  WBC 18.0* 19.9* 21.8*  HGB 9.7* 9.4* 9.6*  HCT 26.5* 25.7* 27.4*  PLT 442* 580* 797*   Recent Labs  Lab 01/19/23 0340 01/20/23 0529  INR 1.0 1.1         Other tests/results: No new cranial imaging  Assessment/Plan:  Samuel Riley 52 year old man with a history of chronic alcoholism who was admitted with a large left-sided subdural hematoma.  He had a subdural hematoma evacuation on 01/17/2023.  He is currently postoperative day 7 -Keep wound clean and dry. - pain control - DVT prophylaxis OK - PTOT -Continue daily CBCs, ideally sodium will be around 130 or greater given his history of hyponatremia and alcoholism, generally we like these patients to be above 135 but given his nutrition and alcoholism will except sodiums 128 or higher.  Lucy Chris, MD Department of Neurosurgery

## 2023-01-25 NOTE — Progress Notes (Signed)
PT Cancellation Note  Patient Details Name: Samuel Riley MRN: [id number] DOB: 1970-03-10   Cancelled Treatment:    Reason Eval/Treat Not Completed: Other (comment).  Chart reviewed and attempted to see pt.  Pt currently having significant headache and requested for therapist to return.  Will re-attempt at later date/time as medically appropriate.   Nolon Bussing, PT, DPT Physical Therapist - [facility]  01/25/23, 4:03 PM

## 2023-01-25 NOTE — Progress Notes (Signed)
Occupational Therapy Treatment Patient Details Name: Samuel Riley MRN: [id number] DOB: 07/24/1970 Today's Date: 01/25/2023   History of present illness Pt is a 52 year old male presenting to the ED with alcohol intoxication and multiple falls while intoxicated. Pt is admitted with subdural hematoma, status post fall S/p left craniotomy for hematoma evacuation done on 11/21   OT comments  Chart reviewed to date, pt greeted in room, alert and oriented x4, sitter present. Pt is agreeable to OT tx session targeting further assessing/improving cognition for safe ADL completion and improving functional activity tolerance/performance. Slight improvements noted in overall functional mobility however pt continues to require MIN A when participating in higher level balance tasks such as head turns while amb simulating the IADL of ADL prep. Pt completed SLUMS examination this date scoring 16/30. Deficits noted in numeric calcuation and registration, delayed recall, memory, visual spation, executive function. Of note, it is not within occupational therapy scope of practice to diagnose cognitive impairments, this screen indicates need for further testing. The SLUMS is a 30 point, 11 question screening questionnaire that tests orientation, memory, attention, and executive function. Pt is left in bed with sitter present, all needs met. OT will continue to follow acutely.   [employer] Mental Status Examination Orientation: 3/3  Calculations: 1/3 Naming animals: 1/3 Patient named 5 animals (0 points is 0-4 animals; 1 is 5-9 animals; 2 is 10-14 animals; 3 is 15+ animals) Recall: 2/5  Attention: 1/2 Clock drawing: 0/4 Visual Processing: 2/2 Paragraph Memory: 6/8  Total: 16/30;   Patient has a bachelors level of education         If plan is discharge home, recommend the following:  A little help with walking and/or transfers;A little help with bathing/dressing/bathroom;Assistance with  cooking/housework;Direct supervision/assist for medications management;Assist for transportation;Supervision due to cognitive status;Help with stairs or ramp for entrance;Direct supervision/assist for financial management   Equipment Recommendations  None recommended by OT    Recommendations for Other Services      Precautions / Restrictions Precautions Precautions: Fall Restrictions Weight Bearing Restrictions: No       Mobility Bed Mobility Overal bed mobility: Needs Assistance Bed Mobility: Supine to Sit, Sit to Supine     Supine to sit: Supervision Sit to supine: Supervision        Transfers Overall transfer level: Needs assistance Equipment used: None Transfers: Sit to/from Stand Sit to Stand: Contact guard assist                 Balance Overall balance assessment: Needs assistance Sitting-balance support: Feet supported Sitting balance-Leahy Scale: Good     Standing balance support: Single extremity supported Standing balance-Leahy Scale: Poor                             ADL either performed or assessed with clinical judgement   ADL Overall ADL's : Needs assistance/impaired                     Lower Body Dressing: Supervision/safety Lower Body Dressing Details (indicate cue type and reason): socks Toilet Transfer: Minimal assistance;Ambulation Toilet Transfer Details (indicate cue type and reason): amb with no AD, intermittent vcs for safety/technique         Functional mobility during ADLs: Minimal assistance (pt unable to perform head turns while amb without LOB;)      Extremity/Trunk Assessment  Vision       Perception     Praxis      Cognition Arousal: Alert Behavior During Therapy: Impulsive Overall Cognitive Status: Impaired/Different from baseline                                 General Comments: participated in SLUMS assessment, scoring 16/30 with deficits noted in numeric  calcuation and registration, delayed recall, memory, visual spation, executive function        Exercises Other Exercises Other Exercises: edu re: role of OT    Shoulder Instructions       General Comments vss throughout    Pertinent Vitals/ Pain       Pain Assessment Pain Assessment: No/denies pain  Home Living                                          Prior Functioning/Environment              Frequency  Min 1X/week        Progress Toward Goals  OT Goals(current goals can now be found in the care plan section)  Progress towards OT goals: Progressing toward goals     Plan      Co-evaluation                 AM-PAC OT "6 Clicks" Daily Activity     Outcome Measure   Help from another person eating meals?: None Help from another person taking care of personal grooming?: None Help from another person toileting, which includes using toliet, bedpan, or urinal?: A Lot Help from another person bathing (including washing, rinsing, drying)?: A Lot Help from another person to put on and taking off regular upper body clothing?: None Help from another person to put on and taking off regular lower body clothing?: None 6 Click Score: 20    End of Session Equipment Utilized During Treatment: Gait belt  OT Visit Diagnosis: Other abnormalities of gait and mobility (R26.89);Other symptoms and signs involving cognitive function   Activity Tolerance Patient tolerated treatment well   Patient Left in bed;with call bell/phone within reach;with bed alarm set;with nursing/sitter in room   Nurse Communication Mobility status        Time: 1610-9604 OT Time Calculation (min): 20 min  Charges: OT General Charges $OT Visit: 1 Visit OT Treatments $Therapeutic Activity: 8-22 mins  Oleta Mouse, OTD OTR/L  01/25/23, 1:14 PM
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
## 2023-01-26 ENCOUNTER — Inpatient Hospital Stay: Payer: BC Managed Care – PPO

## 2023-01-26 DIAGNOSIS — S065XAA Traumatic subdural hemorrhage with loss of consciousness status unknown, initial encounter: Secondary | ICD-10-CM | POA: Diagnosis not present

## 2023-01-26 LAB — MENINGITIS/ENCEPHALITIS PANEL (CSF)
Cryptococcus neoformans/gattii (CSF): NOT DETECTED
Cytomegalovirus (CSF): NOT DETECTED
Enterovirus (CSF): NOT DETECTED
Escherichia coli K1 (CSF): NOT DETECTED
Haemophilus influenzae (CSF): NOT DETECTED
Herpes simplex virus 1 (CSF): NOT DETECTED
Herpes simplex virus 2 (CSF): NOT DETECTED
Human herpesvirus 6 (CSF): DETECTED — AB
Human parechovirus (CSF): NOT DETECTED
Listeria monocytogenes (CSF): NOT DETECTED
Neisseria meningitis (CSF): NOT DETECTED
Streptococcus agalactiae (CSF): NOT DETECTED
Streptococcus pneumoniae (CSF): NOT DETECTED
Varicella zoster virus (CSF): NOT DETECTED

## 2023-01-26 LAB — CBC WITH DIFFERENTIAL/PLATELET
Abs Immature Granulocytes: 3.05 10*3/uL — ABNORMAL HIGH (ref 0.00–0.07)
Basophils Absolute: 0.3 10*3/uL — ABNORMAL HIGH (ref 0.0–0.1)
Basophils Relative: 1 %
Eosinophils Absolute: 0.1 10*3/uL (ref 0.0–0.5)
Eosinophils Relative: 0 %
HCT: 28.1 % — ABNORMAL LOW (ref 39.0–52.0)
Hemoglobin: 10.1 g/dL — ABNORMAL LOW (ref 13.0–17.0)
Immature Granulocytes: 10 %
Lymphocytes Relative: 7 %
Lymphs Abs: 2.2 10*3/uL (ref 0.7–4.0)
MCH: 35.2 pg — ABNORMAL HIGH (ref 26.0–34.0)
MCHC: 35.9 g/dL (ref 30.0–36.0)
MCV: 97.9 fL (ref 80.0–100.0)
Monocytes Absolute: 5 10*3/uL — ABNORMAL HIGH (ref 0.1–1.0)
Monocytes Relative: 17 %
Neutro Abs: 19.1 10*3/uL — ABNORMAL HIGH (ref 1.7–7.7)
Neutrophils Relative %: 65 %
Platelets: 962 10*3/uL (ref 150–400)
RBC: 2.87 MIL/uL — ABNORMAL LOW (ref 4.22–5.81)
RDW: 13.7 % (ref 11.5–15.5)
Smear Review: INCREASED
WBC: 29.6 10*3/uL — ABNORMAL HIGH (ref 4.0–10.5)
nRBC: 0.1 % (ref 0.0–0.2)

## 2023-01-26 LAB — BASIC METABOLIC PANEL
Anion gap: 9 (ref 5–15)
BUN: 5 mg/dL — ABNORMAL LOW (ref 6–20)
CO2: 23 mmol/L (ref 22–32)
Calcium: 7.8 mg/dL — ABNORMAL LOW (ref 8.9–10.3)
Chloride: 92 mmol/L — ABNORMAL LOW (ref 98–111)
Creatinine, Ser: 0.72 mg/dL (ref 0.61–1.24)
GFR, Estimated: >60 mL/min (ref >60)
Glucose, Bld: 147 mg/dL — ABNORMAL HIGH (ref 70–99)
Potassium: 3.5 mmol/L (ref 3.5–5.1)
Sodium: 124 mmol/L — ABNORMAL LOW (ref 135–145)

## 2023-01-26 LAB — CSF CELL COUNT WITH DIFFERENTIAL
Eosinophils, CSF: 0 %
Lymphs, CSF: 16 %
Monocyte-Macrophage-Spinal Fluid: 74 %
RBC Count, CSF: 1614 /mm3 — ABNORMAL HIGH (ref 0–3)
Segmented Neutrophils-CSF: 10 %
Tube #: 1
WBC, CSF: 15 /mm3 (ref 0–5)

## 2023-01-26 LAB — PROTEIN AND GLUCOSE, CSF
Glucose, CSF: 81 mg/dL — ABNORMAL HIGH (ref 40–70)
Total  Protein, CSF: 55 mg/dL — ABNORMAL HIGH (ref 15–45)

## 2023-01-26 LAB — LACTIC ACID, PLASMA
Lactic Acid, Venous: 1.3 mmol/L (ref 0.5–1.9)
Lactic Acid, Venous: 2.4 mmol/L (ref 0.5–1.9)

## 2023-01-26 MED ORDER — SODIUM CHLORIDE 0.9 % IV BOLUS
1000.0000 mL | Freq: Once | INTRAVENOUS | Status: AC
  Administered 2023-01-26: 1000 mL via INTRAVENOUS

## 2023-01-26 MED ORDER — SODIUM CHLORIDE 0.9 % IV SOLN
2.0000 g | Freq: Three times a day (TID) | INTRAVENOUS | Status: DC
  Administered 2023-01-26 (×2): 2 g via INTRAVENOUS
  Filled 2023-01-26 (×3): qty 12.5

## 2023-01-26 MED ORDER — VANCOMYCIN HCL 1250 MG/250ML IV SOLN
1250.0000 mg | Freq: Two times a day (BID) | INTRAVENOUS | Status: DC
  Administered 2023-01-26: 1250 mg via INTRAVENOUS
  Filled 2023-01-26: qty 250

## 2023-01-26 MED ORDER — IBUPROFEN 400 MG PO TABS
800.0000 mg | ORAL_TABLET | Freq: Once | ORAL | Status: AC
  Administered 2023-01-26: 800 mg via ORAL
  Filled 2023-01-26: qty 2

## 2023-01-26 MED ORDER — ACETAMINOPHEN 500 MG PO TABS
1000.0000 mg | ORAL_TABLET | Freq: Four times a day (QID) | ORAL | Status: DC | PRN
  Administered 2023-01-27 – 2023-01-30 (×5): 1000 mg via ORAL
  Filled 2023-01-26 (×5): qty 2

## 2023-01-26 MED ORDER — ACETAMINOPHEN 325 MG PO TABS
650.0000 mg | ORAL_TABLET | ORAL | Status: DC | PRN
  Administered 2023-01-26: 650 mg via ORAL
  Filled 2023-01-26: qty 2

## 2023-01-26 MED ORDER — VANCOMYCIN HCL 1500 MG/300ML IV SOLN
1500.0000 mg | Freq: Once | INTRAVENOUS | Status: AC
  Administered 2023-01-26: 1500 mg via INTRAVENOUS
  Filled 2023-01-26: qty 300

## 2023-01-26 MED ORDER — ACETAMINOPHEN 325 MG PO TABS
650.0000 mg | ORAL_TABLET | Freq: Three times a day (TID) | ORAL | Status: DC | PRN
  Administered 2023-01-26: 650 mg via ORAL
  Filled 2023-01-26: qty 2

## 2023-01-26 NOTE — Progress Notes (Signed)
Attending Progress Note  History: Samuel Riley is here for subdural hematoma status POD 9 s/p left-sided craniotomy for subdural hematoma evacuation, has been in the ICU postoperatively, now under the care of the hospitalist team.   24-hour events: Exam this morning remains stable but patient has been more tachycardic and possible febrile given he has been on Tylenol.  His labs show increasing white count and primary team has started evaluation for infection and given broad-spectrum antibiotics.  They requested LP to rule out meningitis and we will perform that today.  Physical Exam: Vitals:   01/26/23 0339 01/26/23 0716  BP: 136/78 107/63  Pulse: (!) 128 (!) 123  Resp: 20 20  Temp: 98.4 F (36.9 C) 99.3 F (37.4 C)  SpO2: 96% 97%    Awake and alert CNI, periorbital ecchymosis  Moving all 4 extremities to command Incision clean and dry  Data:  Recent Labs  Lab 01/24/23 0438 01/25/23 0430 01/26/23 0508  NA 130* 130* 124*  K 3.8 3.7 3.5  CL 95* 96* 92*  CO2 27 26 23   BUN 6 5* 5*  CREATININE 0.65 0.63 0.72  GLUCOSE 149* 107* 147*  CALCIUM 8.0* 7.8* 7.8*   Recent Labs  Lab 01/25/23 0430  AST 27  ALT 44  ALKPHOS 77     Recent Labs  Lab 01/24/23 0438 01/25/23 0430 01/26/23 0508  WBC 19.9* 21.8* 29.6*  HGB 9.4* 9.6* 10.1*  HCT 25.7* 27.4* 28.1*  PLT 580* 797* 962*   Recent Labs  Lab 01/20/23 0529  INR 1.1         Other tests/results: CT head 11/30: There is stable extra-axial blood products that do not appear to be under pressure measuring up to 11 mm.  There is some improvement of the midline shift to 7 mm.  Assessment/Plan:  Samuel Riley 52 year old man with a history of chronic alcoholism who was admitted with a large left-sided subdural hematoma.  He had a subdural hematoma evacuation on 01/17/2023.  He is currently postoperative day 9 -Incision remains clean and dry, appropriately healing - DVT prophylaxis OK -Medical team managing  his hyponatremia and alcoholism, workup for infection today. -Will perform LP with CSF studies  Lucy Chris, MD Department of Neurosurgery

## 2023-01-26 NOTE — Consult Note (Signed)
Pharmacy Antibiotic Note  Samuel Riley is a 52 y.o. male admitted on 01/17/2023 with  subdural hematoma .  Patient is s/p left-sided craniotomy for subdural hematoma evacuation. When examined this morning, patient was febrile, had an elevated white count, and complained of headaches. Pharmacy has been consulted for Vancomcyin and Cefepime dosing due to concern for hospital-acquired meningitis.  Plan: Vancomycin 1250 mg IV Q 12 hrs. Goal AUC 400-550. Expected AUC: 572.3 Expected Cmin: 15.1 SCr used: 0.8, Vd used: 0.72  Cefepime 2g IV Q8 hours   Height: 5\' 8"  (172.7 cm) Weight: 67.3 kg (148 lb 5.9 oz) IBW/kg (Calculated) : 68.4  Temp (24hrs), Avg:98.7 F (37.1 C), Min:98.1 F (36.7 C), Max:99.8 F (37.7 C)  Recent Labs  Lab 01/22/23 0441 01/23/23 0545 01/24/23 0438 01/25/23 0430 01/26/23 0508  WBC 13.8* 18.0* 19.9* 21.8* 29.6*  CREATININE 0.68 0.73 0.65 0.63 0.72    Estimated Creatinine Clearance: 101.7 mL/min (by C-G formula based on SCr of 0.72 mg/dL).    No Known Allergies  Antimicrobials this admission: Vancomycin 11/30 >>  Cefepime 11/30 >>   Dose adjustments this admission: N/A  Microbiology results: 11/30 CSF: pending 11/30 Fungal cx: collected  11/30 Bcx: collected  11/26 MRSA PCR: negative  Thank you for allowing pharmacy to be a part of this patient's care.  Samuel Riley A Samuel Riley 01/26/2023 10:55 AM
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
## 2023-01-26 NOTE — Progress Notes (Signed)
PT Cancellation Note  Patient Details Name: Samuel Riley MRN: [id number] DOB: January 05, 1971   Cancelled Treatment:    Patient febrile with an elevated white count and c/o of headache. Will hold off on session today and reassess next available date/time per POC.     Jannet Askew 01/26/2023, 1:10 PM

## 2023-01-26 NOTE — Progress Notes (Signed)
PHARMACY - PHYSICIAN COMMUNICATION CRITICAL VALUE ALERT - BLOOD CULTURE IDENTIFICATION (BCID)  Samuel Riley is an 52 y.o. male who presented to [facility] on 01/17/2023 with concern for hospital-acquired meningitis.   Assessment: Meningitis/Encephalitis CSF panel,  human herpes virus 6 (HHV-6) detected.   Name of physician (or Provider) Contacted: Dr. Darlin Priestly  Current antibiotics: Vancomycin, Cefepime  Changes to prescribed antibiotics recommended:  Patient is on recommended antibiotics - No changes needed  No results found for this or any previous visit.  Jerrilyn Cairo, PharmD 01/26/2023 3:28 PM

## 2023-01-26 NOTE — Procedures (Signed)
Procedure note  Date: 11/30 Surgeon: Lucy Chris, MD     Indication: Patient is 9 days postoperative from a subdural hematoma evacuation.  He has had a rising white count and concern for sepsis.  Given this, broad-spectrum antibiotics have been started and a lumbar puncture was discussed to rule out meningitis.  Consent was obtained at bedside and witnessed by the nurse.  Procedure: The patient was positioned in a lateral decubitus position.  His legs were flexed.  A brief timeout was conducted.  The lumbar puncture site was planned using anatomical landmarks.  The skin was prepped and draped in a sterile condition.  The needle was inserted adjacent to the spinous processes towards the L4-5 interspace.  Once loss of resistance obtained, clear CSF was obtained. Opening pressure was 28 cm H20.  This was collected for studies.  Closing pressure was 16. The needle was removed and the patient remained flat for 1 hour following the procedure.  He tolerated well  Specimen CSF studies sent for meningitis panel, cell count, protein, glucose, culture  Lucy Chris, MD

## 2023-01-27 ENCOUNTER — Inpatient Hospital Stay: Payer: BC Managed Care – PPO

## 2023-01-27 DIAGNOSIS — B9561 Methicillin susceptible Staphylococcus aureus infection as the cause of diseases classified elsewhere: Secondary | ICD-10-CM | POA: Diagnosis not present

## 2023-01-27 DIAGNOSIS — F1010 Alcohol abuse, uncomplicated: Secondary | ICD-10-CM | POA: Diagnosis not present

## 2023-01-27 DIAGNOSIS — R7881 Bacteremia: Secondary | ICD-10-CM | POA: Diagnosis not present

## 2023-01-27 DIAGNOSIS — S065XAA Traumatic subdural hemorrhage with loss of consciousness status unknown, initial encounter: Secondary | ICD-10-CM | POA: Diagnosis not present

## 2023-01-27 LAB — HEPATIC FUNCTION PANEL
ALT: 39 U/L (ref 0–44)
AST: 35 U/L (ref 15–41)
Albumin: 2.5 g/dL — ABNORMAL LOW (ref 3.5–5.0)
Alkaline Phosphatase: 84 U/L (ref 38–126)
Bilirubin, Direct: 0.2 mg/dL (ref 0.0–0.2)
Indirect Bilirubin: 0.4 mg/dL (ref 0.3–0.9)
Total Bilirubin: 0.6 mg/dL (ref <1.2)
Total Protein: 5.8 g/dL — ABNORMAL LOW (ref 6.5–8.1)

## 2023-01-27 LAB — BLOOD CULTURE ID PANEL (REFLEXED) - BCID2

## 2023-01-27 LAB — BASIC METABOLIC PANEL
Anion gap: 7 (ref 5–15)
BUN: 10 mg/dL (ref 6–20)
CO2: 27 mmol/L (ref 22–32)
Calcium: 7.8 mg/dL — ABNORMAL LOW (ref 8.9–10.3)
Chloride: 96 mmol/L — ABNORMAL LOW (ref 98–111)
Creatinine, Ser: 0.66 mg/dL (ref 0.61–1.24)
GFR, Estimated: >60 mL/min (ref >60)
Glucose, Bld: 118 mg/dL — ABNORMAL HIGH (ref 70–99)
Potassium: 3.6 mmol/L (ref 3.5–5.1)
Sodium: 130 mmol/L — ABNORMAL LOW (ref 135–145)

## 2023-01-27 LAB — CBC
HCT: 28.5 % — ABNORMAL LOW (ref 39.0–52.0)
Hemoglobin: 10.1 g/dL — ABNORMAL LOW (ref 13.0–17.0)
MCH: 34.5 pg — ABNORMAL HIGH (ref 26.0–34.0)
MCHC: 35.4 g/dL (ref 30.0–36.0)
MCV: 97.3 fL (ref 80.0–100.0)
Platelets: 981 10*3/uL (ref 150–400)
RBC: 2.93 MIL/uL — ABNORMAL LOW (ref 4.22–5.81)
RDW: 14.2 % (ref 11.5–15.5)
WBC: 30.1 10*3/uL — ABNORMAL HIGH (ref 4.0–10.5)
nRBC: 0.1 % (ref 0.0–0.2)

## 2023-01-27 LAB — LACTIC ACID, PLASMA: Lactic Acid, Venous: 1.1 mmol/L (ref 0.5–1.9)

## 2023-01-27 LAB — GLUCOSE, CAPILLARY: Glucose-Capillary: 102 mg/dL — ABNORMAL HIGH (ref 70–99)

## 2023-01-27 LAB — MAGNESIUM: Magnesium: 2 mg/dL (ref 1.7–2.4)

## 2023-01-27 MED ORDER — NAFCILLIN SODIUM 2 G IJ SOLR
2.0000 g | Freq: Once | INTRAMUSCULAR | Status: AC
  Administered 2023-01-27: 2 g via INTRAVENOUS
  Filled 2023-01-27: qty 8

## 2023-01-27 MED ORDER — ENOXAPARIN SODIUM 40 MG/0.4ML IJ SOSY
40.0000 mg | PREFILLED_SYRINGE | Freq: Every day | INTRAMUSCULAR | Status: DC
  Administered 2023-01-27 – 2023-02-04 (×9): 40 mg via SUBCUTANEOUS
  Filled 2023-01-27 (×9): qty 0.4

## 2023-01-27 MED ORDER — NAFCILLIN SODIUM 2 G IJ SOLR
2.0000 g | INTRAMUSCULAR | Status: DC
  Filled 2023-01-27: qty 8

## 2023-01-27 MED ORDER — SODIUM CHLORIDE 0.9 % IV SOLN
12.0000 g | INTRAVENOUS | Status: AC
  Administered 2023-01-27 – 2023-02-03 (×8): 12 g via INTRAVENOUS
  Filled 2023-01-27 (×9): qty 48

## 2023-01-27 MED ORDER — CEFAZOLIN SODIUM-DEXTROSE 2-4 GM/100ML-% IV SOLN
2.0000 g | Freq: Three times a day (TID) | INTRAVENOUS | Status: DC
  Administered 2023-01-27 (×2): 2 g via INTRAVENOUS
  Filled 2023-01-27 (×4): qty 100
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
## 2023-01-27 NOTE — Plan of Care (Signed)
  Problem: Health Behavior/Discharge Planning: Goal: Ability to manage health-related needs will improve Outcome: Progressing   Problem: Nutrition: Goal: Adequate nutrition will be maintained Outcome: Progressing   Problem: Safety: Goal: Ability to remain free from injury will improve Outcome: Progressing   Problem: Nutritional: Goal: Maintenance of adequate nutrition will improve Outcome: Progressing
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
## 2023-01-27 NOTE — Progress Notes (Signed)
AT 0430 Manuela Schwartz NP paged due to a mental status in the patient. Patient had slurred speech and repetitive speech along with looking up at the ceiling intermittently. Patient had a temp of 103.5 degress earlier in the shift and required a cooling blanket to be applied. During the change in speech patient was no longer on the cooling blanket and afebrile with a temperature of 97.8 degrees. Sitter Eli NT and [place] NT3 present with me at the bedside during the incident. Stat CT ordered and patient taken to CT without incident.

## 2023-01-27 NOTE — Progress Notes (Signed)
PHARMACY - PHYSICIAN COMMUNICATION CRITICAL VALUE ALERT - BLOOD CULTURE IDENTIFICATION (BCID)  Results for orders placed or performed during the hospital encounter of 01/17/23  MRSA Next Gen by PCR, Nasal     Status: None   Collection Time: 01/22/23 12:59 AM   Specimen: Nasal Mucosa; Nasal Swab  Result Value Ref Range Status   MRSA by PCR Next Gen NOT DETECTED NOT DETECTED Final    Comment: (NOTE) The GeneXpert MRSA Assay (FDA approved for NASAL specimens only), is one component of a comprehensive MRSA colonization surveillance program. It is not intended to diagnose MRSA infection nor to guide or monitor treatment for MRSA infections. Test performance is not FDA approved in patients less than 8 years old. Performed at [facility], [address] [place], [place] [address]   CSF culture w Gram Stain     Status: None (Preliminary result)   Collection Time: 01/26/23  9:35 AM   Specimen: CSF; Cerebrospinal Fluid  Result Value Ref Range Status   Specimen Description CSF  Final   Special Requests NONE  Final   Gram Stain   Final    NO ORGANISMS SEEN WBC SEEN RBC SEEN CYTOSPIN SMEAR Performed at [facility], [address], [place], [place] [address]    Culture PENDING  Incomplete   Report Status PENDING  Incomplete  Culture, blood (Routine X 2) w Reflex to ID Panel     Status: None (Preliminary result)   Collection Time: 01/26/23  9:50 AM   Specimen: BLOOD  Result Value Ref Range Status   Specimen Description BLOOD RAC  Final   Special Requests   Final    BOTTLES DRAWN AEROBIC AND ANAEROBIC Blood Culture results may not be optimal due to an inadequate volume of blood received in culture bottles   Culture  Setup Time   Final    GRAM POSITIVE COCCI IN BOTH AEROBIC AND ANAEROBIC BOTTLES STAPHYLOCOCCUS AUREUS CRITICAL RESULT CALLED TO, READ BACK BY AND VERIFIED WITHDawayne Cirri @ 0013 01/28/23 [facility] Performed at [facility], [address]  [place], [place] [address]    Culture GRAM POSITIVE COCCI  Final   Report Status PENDING  Incomplete  Culture, blood (Routine X 2) w Reflex to ID Panel     Status: None (Preliminary result)   Collection Time: 01/26/23  9:55 AM   Specimen: BLOOD RIGHT HAND  Result Value Ref Range Status   Specimen Description BLOOD RIGHT HAND  Final   Special Requests   Final    BOTTLES DRAWN AEROBIC AND ANAEROBIC Blood Culture results may not be optimal due to an inadequate volume of blood received in culture bottles   Culture  Setup Time   Final    GRAM POSITIVE COCCI Organism ID to follow IN BOTH AEROBIC AND ANAEROBIC BOTTLES STAPHYLOCOCCUS AUREUS CRITICAL RESULT CALLED TO, READ BACK BY AND VERIFIED WITHDawayne Cirri @ 0013 01/27/23 [facility] Performed at [facility], [address] [place], [place] [address]    Culture GRAM POSITIVE COCCI  Final   Report Status PENDING  Incomplete  Blood Culture ID Panel (Reflexed)     Status: Abnormal   Collection Time: 01/26/23  9:55 AM  Result Value Ref Range Status   Enterococcus faecalis NOT DETECTED NOT DETECTED Final   Enterococcus Faecium NOT DETECTED NOT DETECTED Final   Listeria monocytogenes NOT DETECTED NOT DETECTED Final   Staphylococcus species DETECTED (A) NOT DETECTED Final    Comment: CRITICAL RESULT CALLED TO, READ  BACK BY AND VERIFIED WITH: Masahiro Iglesia @ 0013 01/27/23 [facility]    Staphylococcus aureus (BCID) DETECTED (A) NOT DETECTED Final    Comment: CRITICAL RESULT CALLED TO, READ BACK BY AND VERIFIED WITH: Gerod Caligiuri @ 0013 01/27/23 [facility]    Staphylococcus epidermidis NOT DETECTED NOT DETECTED Final   Staphylococcus lugdunensis NOT DETECTED NOT DETECTED Final   Streptococcus species NOT DETECTED NOT DETECTED Final   Streptococcus agalactiae NOT DETECTED NOT DETECTED Final   Streptococcus pneumoniae NOT DETECTED NOT DETECTED Final   Streptococcus pyogenes NOT DETECTED NOT DETECTED Final   A.calcoaceticus-baumannii NOT DETECTED NOT DETECTED  Final   Bacteroides fragilis NOT DETECTED NOT DETECTED Final   Enterobacterales NOT DETECTED NOT DETECTED Final   Enterobacter cloacae complex NOT DETECTED NOT DETECTED Final   Escherichia coli NOT DETECTED NOT DETECTED Final   Klebsiella aerogenes NOT DETECTED NOT DETECTED Final   Klebsiella oxytoca NOT DETECTED NOT DETECTED Final   Klebsiella pneumoniae NOT DETECTED NOT DETECTED Final   Proteus species NOT DETECTED NOT DETECTED Final   Salmonella species NOT DETECTED NOT DETECTED Final   Serratia marcescens NOT DETECTED NOT DETECTED Final   Haemophilus influenzae NOT DETECTED NOT DETECTED Final   Neisseria meningitidis NOT DETECTED NOT DETECTED Final   Pseudomonas aeruginosa NOT DETECTED NOT DETECTED Final   Stenotrophomonas maltophilia NOT DETECTED NOT DETECTED Final   Candida albicans NOT DETECTED NOT DETECTED Final   Candida auris NOT DETECTED NOT DETECTED Final   Candida glabrata NOT DETECTED NOT DETECTED Final   Candida krusei NOT DETECTED NOT DETECTED Final   Candida parapsilosis NOT DETECTED NOT DETECTED Final   Candida tropicalis NOT DETECTED NOT DETECTED Final   Cryptococcus neoformans/gattii NOT DETECTED NOT DETECTED Final   Meth resistant mecA/C and MREJ NOT DETECTED NOT DETECTED Final    Comment: Performed at [facility], [address] [place], [place] [address]    BCID Results: 4 of 4 bottles w/ Staph aureus, no resistance detected.  Pt currently on Cefepime and Vancomycin  Name of provider contacted: Cliffton Asters, NP   Changes to prescribed antibiotics required: Transition pt to Cefazolin.  Otelia Sergeant, PharmD, [facility] 01/27/2023 12:57 AM

## 2023-01-28 ENCOUNTER — Inpatient Hospital Stay (HOSPITAL_COMMUNITY)
Admit: 2023-01-28 | Discharge: 2023-01-28 | Disposition: A | Discharge status: Home / Self Care | Payer: BC Managed Care – PPO | Attending: Osteopathic Medicine

## 2023-01-28 DIAGNOSIS — R7881 Bacteremia: Secondary | ICD-10-CM

## 2023-01-28 DIAGNOSIS — B9561 Methicillin susceptible Staphylococcus aureus infection as the cause of diseases classified elsewhere: Secondary | ICD-10-CM | POA: Diagnosis not present

## 2023-01-28 DIAGNOSIS — F10920 Alcohol use, unspecified with intoxication, uncomplicated: Secondary | ICD-10-CM | POA: Diagnosis not present

## 2023-01-28 DIAGNOSIS — S065XAA Traumatic subdural hemorrhage with loss of consciousness status unknown, initial encounter: Secondary | ICD-10-CM | POA: Diagnosis not present

## 2023-01-28 LAB — BASIC METABOLIC PANEL
Anion gap: 10 (ref 5–15)
BUN: <5 mg/dL — ABNORMAL LOW (ref 6–20)
CO2: 30 mmol/L (ref 22–32)
Calcium: 7.7 mg/dL — ABNORMAL LOW (ref 8.9–10.3)
Chloride: 89 mmol/L — ABNORMAL LOW (ref 98–111)
Creatinine, Ser: 0.81 mg/dL (ref 0.61–1.24)
GFR, Estimated: >60 mL/min (ref >60)
Glucose, Bld: 99 mg/dL (ref 70–99)
Potassium: 3.3 mmol/L — ABNORMAL LOW (ref 3.5–5.1)
Sodium: 129 mmol/L — ABNORMAL LOW (ref 135–145)

## 2023-01-28 LAB — ECHOCARDIOGRAM COMPLETE
AR max vel: 2.69 cm2
AV Area VTI: 3.04 cm2
AV Area mean vel: 3.21 cm2
AV Mean grad: 9 mm[Hg]
AV Peak grad: 17.6 mm[Hg]
Ao pk vel: 2.1 m/s
Area-P 1/2: 3.05 cm2
Height: 68 in
MV VTI: 4.96 cm2
S' Lateral: 3.1 cm
Weight: 2427.2 [oz_av]

## 2023-01-28 LAB — CBC
HCT: 26.6 % — ABNORMAL LOW (ref 39.0–52.0)
Hemoglobin: 9.6 g/dL — ABNORMAL LOW (ref 13.0–17.0)
MCH: 34.5 pg — ABNORMAL HIGH (ref 26.0–34.0)
MCHC: 36.1 g/dL — ABNORMAL HIGH (ref 30.0–36.0)
MCV: 95.7 fL (ref 80.0–100.0)
Platelets: 1015 10*3/uL (ref 150–400)
RBC: 2.78 MIL/uL — ABNORMAL LOW (ref 4.22–5.81)
RDW: 13.9 % (ref 11.5–15.5)
WBC: 22.9 10*3/uL — ABNORMAL HIGH (ref 4.0–10.5)
nRBC: 1.2 % — ABNORMAL HIGH (ref 0.0–0.2)

## 2023-01-28 LAB — PATHOLOGIST SMEAR REVIEW

## 2023-01-28 LAB — NOROVIRUS GROUP 1 & 2 BY PCR, STOOL

## 2023-01-28 LAB — MAGNESIUM: Magnesium: 2 mg/dL (ref 1.7–2.4)

## 2023-01-28 MED ORDER — POTASSIUM CHLORIDE CRYS ER 20 MEQ PO TBCR
40.0000 meq | EXTENDED_RELEASE_TABLET | Freq: Once | ORAL | Status: AC
  Administered 2023-01-28: 40 meq via ORAL
  Filled 2023-01-28: qty 2

## 2023-01-28 MED ORDER — OXYCODONE HCL 5 MG PO TABS
5.0000 mg | ORAL_TABLET | Freq: Four times a day (QID) | ORAL | Status: DC | PRN
  Administered 2023-01-28 – 2023-02-03 (×12): 5 mg via ORAL
  Filled 2023-01-28 (×13): qty 1

## 2023-01-28 NOTE — Progress Notes (Signed)
Inpatient Rehab Admissions Coordinator:   Received fax from [facility] this morning that plan terminated on 11/30.  I spoke to 2 reps at [facility] who both confirmed they had no record of coverage, despite hospital stay approved through tomorrow.  I spoke to pt's son, Terese Door, and updated him.  He plans to call [employer].  I will continue to follow.   Estill Dooms, PT, DPT Admissions Coordinator [phone] 01/28/23  4:52 PM

## 2023-01-28 NOTE — Progress Notes (Signed)
*  PRELIMINARY RESULTS* Echocardiogram 2D Echocardiogram has been performed.  Samuel Riley 01/28/2023, 12:34 PM

## 2023-01-28 NOTE — Progress Notes (Signed)
   Neurosurgery Progress Note  History: Samuel Riley is s/p left craniotomy for evacuation of subdural hematoma.  POD11: pt complaining of persistent mild headache which is not positional in nature.   Physical Exam: Vitals:   01/27/23 2351 01/28/23 0423  BP: 123/80 (!) 146/102  Pulse: (!) 102 (!) 108  Resp: 16 16  Temp:  99.2 F (37.3 C)  SpO2: 95% 99%    AA Ox3  CNI No pronator drift Strength:MAEW Incision c/d/I with staples in place   Data:  Other tests/results:  CSF studies NTD  Assessment/Plan:  Samuel Riley is a 52 y.o presenting to the ER after trauma status post left craniotomy for subdural hematoma evacuation on 01/17/2023.  Lumbar puncture done on 01/26/2023 for persistent leukocytosis and concern for underlying meningitis.  - ok DVT prophylaxis from neurosurgical standpoint -No activity restrictions from neurosurgical standpoint. - Continue to monitor hyponatremia. - staples to be removed on 12/4. Can be done in rehab if patient is discharged  Manning Charity PA-C Department of Neurosurgery

## 2023-01-28 NOTE — Progress Notes (Signed)
Physical Therapy Treatment Patient Details Name: Samuel Riley MRN: [id number] DOB: 30-Jun-1970 Today's Date: 01/28/2023   History of Present Illness Pt is a 52 year old male presenting to the ED with alcohol intoxication and multiple falls while intoxicated. Pt is admitted with subdural hematoma, status post fall S/p left craniotomy for hematoma evacuation done on 11/21    PT Comments  Patient in bathroom with IV pole upon PT arrival, no socks noted. Pt had difficulty navigating room environment (getting around the door, IV pole placement) supervision for navigation of obstacles. Higher level balance activities performed and pt demonstrated a DGI score of 13. Noted for decreased short term memory, cues for multistep commands and extra time for processing. Pt challenged with higher level balance activities, required minA 2-3 times due to LOB.  Overall the patient demonstrated deficits (see "PT Problem List") that impede the patient's functional abilities, safety, and mobility and would benefit from skilled PT intervention (>3hrs) to maximize function, safety, and independence.     If plan is discharge home, recommend the following: Assistance with cooking/housework;Assist for transportation;Help with stairs or ramp for entrance;Supervision due to cognitive status   Can travel by private vehicle        Equipment Recommendations  None recommended by PT    Recommendations for Other Services       Precautions / Restrictions Precautions Precautions: Fall Restrictions Weight Bearing Restrictions: No     Mobility  Bed Mobility Overal bed mobility: Modified Independent                  Transfers Overall transfer level: Needs assistance Equipment used: None Transfers: Sit to/from Stand Sit to Stand: Supervision                Ambulation/Gait Ambulation/Gait assistance: Supervision Gait Distance (Feet): 500 Feet Assistive device: None Gait Pattern/deviations:  Step-through pattern, Decreased stride length, Wide base of support           Stairs             Wheelchair Mobility     Tilt Bed    Modified Rankin (Stroke Patients Only)       Balance Overall balance assessment: Needs assistance Sitting-balance support: Feet supported Sitting balance-Leahy Scale: Good     Standing balance support: Single extremity supported Standing balance-Leahy Scale: Good   Single Leg Stance - Right Leg: 3 Single Leg Stance - Left Leg: 4 Tandem Stance - Right Leg: 15 Tandem Stance - Left Leg: 15         Standardized Balance Assessment Standardized Balance Assessment : Dynamic Gait Index   Dynamic Gait Index Level Surface: Mild Impairment Change in Gait Speed: Mild Impairment Gait with Horizontal Head Turns: Moderate Impairment Gait with Vertical Head Turns: Moderate Impairment Gait and Pivot Turn: Mild Impairment Step Over Obstacle: Mild Impairment Step Around Obstacles: Moderate Impairment Steps: Mild Impairment (clinical judgement) Total Score: 13      Cognition Arousal: Alert Behavior During Therapy: Impulsive Overall Cognitive Status: Impaired/Different from baseline Area of Impairment: Attention, Memory, Following commands, Safety/judgement, Awareness, Problem solving                   Current Attention Level: Sustained Memory: Decreased short-term memory Following Commands: Follows one step commands consistently Safety/Judgement: Decreased awareness of safety, Decreased awareness of deficits   Problem Solving: Slow processing, Requires verbal cues, Decreased initiation          Exercises Other Exercises Other Exercises: lateral stepping, retroambulation, DGI, standing  tandem stance with SUE support, single leg stance with SUE support, tandem walking with minA    General Comments        Pertinent Vitals/Pain Pain Assessment Pain Assessment: Faces Faces Pain Scale: Hurts a little bit Pain Location:  head Pain Descriptors / Indicators: Aching Pain Intervention(s): Monitored during session, Repositioned, Patient requesting pain meds-RN notified    Home Living                          Prior Function            PT Goals (current goals can now be found in the care plan section) Progress towards PT goals: Progressing toward goals    Frequency    Min 1X/week      PT Plan      Co-evaluation              AM-PAC PT "6 Clicks" Mobility   Outcome Measure  Help needed turning from your back to your side while in a flat bed without using bedrails?: None Help needed moving from lying on your back to sitting on the side of a flat bed without using bedrails?: None Help needed moving to and from a bed to a chair (including a wheelchair)?: None Help needed standing up from a chair using your arms (e.g., wheelchair or bedside chair)?: None Help needed to walk in hospital room?: A Little Help needed climbing 3-5 steps with a railing? : A Little 6 Click Score: 22    End of Session   Activity Tolerance: Patient tolerated treatment well Patient left: in bed;with call bell/phone within reach;with family/visitor present Nurse Communication: Mobility status;Patient requests pain meds PT Visit Diagnosis: Muscle weakness (generalized) (M62.81);Unsteadiness on feet (R26.81)     Time: 1093-2355 PT Time Calculation (min) (ACUTE ONLY): 17 min  Charges:    $Therapeutic Activity: 8-22 mins PT General Charges $$ ACUTE PT VISIT: 1 Visit                    Olga Coaster PT, DPT 10:32 AM,01/28/23

## 2023-01-28 NOTE — Progress Notes (Signed)
Date of Admission:  01/17/2023     ID: Samuel Riley is a 52 y.o. male Principal Problem:   Subdural hematoma (HCC) Active Problems:   Alcohol abuse   Right sided weakness   Traumatic brain compression with herniation, initial encounter (HCC)   Alcoholic intoxication without complication (HCC)   Alcohol use disorder, severe, dependence (HCC)   Pressure injury of skin    Subjective: Pt says he is doing fine  Medications:   vitamin C  500 mg Oral BID   divalproex  500 mg Oral QHS   divalproex  750 mg Oral Daily   enoxaparin (LOVENOX) injection  40 mg Subcutaneous QHS   feeding supplement (NEPRO CARB STEADY)  237 mL Oral TID BM   folic acid  1 mg Oral Daily   metoprolol tartrate  12.5 mg Oral BID   multivitamin with minerals  1 tablet Oral Daily   pantoprazole  40 mg Oral Daily   thiamine  100 mg Oral Daily   Vitamin D (Ergocalciferol)  50,000 Units Oral Q7 days    Objective: Vital signs in last 24 hours: Patient Vitals for the past 24 hrs:  BP Temp Temp src Pulse Resp SpO2  01/28/23 1046 -- -- -- -- 20 --  01/28/23 1002 -- -- -- -- 17 --  01/28/23 0843 (!) 146/80 98.5 F (36.9 C) Oral (!) 107 18 97 %  01/28/23 0423 (!) 146/102 99.2 F (37.3 C) -- (!) 108 16 99 %  01/27/23 2351 123/80 -- -- (!) 102 16 95 %  01/27/23 1928 (!) 144/67 99.7 F (37.6 C) -- (!) 125 18 96 %  01/27/23 1714 (!) 149/89 (!) 100.7 F (38.2 C) -- (!) 120 (!) 24 99 %  01/27/23 1456 124/78 98.4 F (36.9 C) -- (!) 111 (!) 25 99 %  01/27/23 1251 123/69 98.4 F (36.9 C) -- (!) 110 (!) 24 97 %      PHYSICAL EXAM:  General: Alert, cooperative, no distress, appears stated age.  Head: surgical site left hemicranium clean , no discharge , no erythema Eyes: Conjunctivae clear, anicteric sclerae. Pupils are equal ENT Nares normal. No drainage or sinus tenderness. Lips, mucosa, and tongue normal. No Thrush Neck: Supple, symmetrical, no adenopathy, thyroid: non tender no carotid bruit and no  JVD. Back: No CVA tenderness. Lungs: Clear to auscultation bilaterally. No Wheezing or Rhonchi. No rales. Heart: Regular rate and rhythm, no murmur, rub or gallop. Abdomen: Soft, non-tender,not distended. Bowel sounds normal. No masses Extremities: atraumatic, no cyanosis. No edema. No clubbing Skin: No rashes or lesions. Or bruising Scab on his rt foot- no erytthema Lymph: Cervical, supraclavicular normal. Neurologic: Grossly non-focal  Lab Results    Latest Ref Rng & Units 01/28/2023    4:19 AM 01/27/2023    5:10 AM 01/26/2023    5:08 AM  CBC  WBC 4.0 - 10.5 K/uL 22.9  30.1  29.6   Hemoglobin 13.0 - 17.0 g/dL 9.6  27.2  53.6   Hematocrit 39.0 - 52.0 % 26.6  28.5  28.1   Platelets 150 - 400 K/uL 1,015  981  962        Latest Ref Rng & Units 01/28/2023    4:19 AM 01/27/2023    4:30 PM 01/27/2023    5:10 AM  CMP  Glucose 70 - 99 mg/dL 99   644   BUN 6 - 20 mg/dL <5   10   Creatinine 0.34 - 1.24 mg/dL 7.42   5.95  Sodium 135 - 145 mmol/L 129   130   Potassium 3.5 - 5.1 mmol/L 3.3   3.6   Chloride 98 - 111 mmol/L 89   96   CO2 22 - 32 mmol/L 30   27   Calcium 8.9 - 10.3 mg/dL 7.7   7.8   Total Protein 6.5 - 8.1 g/dL  5.8    Total Bilirubin <1.2 mg/dL  0.6    Alkaline Phos 38 - 126 U/L  84    AST 15 - 41 U/L  35    ALT 0 - 44 U/L  39        Microbiology:  Studies/Results: CT HEAD WO CONTRAST ( )  Result Date: 01/27/2023 CLINICAL DATA:  Meningitis/CNS infection suspected. EXAM: CT HEAD WITHOUT CONTRAST TECHNIQUE: Contiguous axial images were obtained from the base of the skull through the vertex without intravenous contrast. RADIATION DOSE REDUCTION: This exam was performed according to the departmental dose-optimization program which includes automated exposure control, adjustment of the mA and/or kV according to patient size and/or use of iterative reconstruction technique. COMPARISON:  Yesterday FINDINGS: Brain: Subdural hematoma with evacuation on the left. Maximal  thickness is at the frontal convexity anteriorly, measuring 12 mm. Local cerebral mass effect with midline shift of 7 mm, unchanged. No new hemorrhage. No hydrocephalus, infarct, or mass. Stable pneumocephalus. Vascular: No hyperdense vessel or unexpected calcification. Skull: Unremarkable left-sided craniotomy Sinuses/Orbits: Negative IMPRESSION: Left subdural hematoma with recent evacuation. Unchanged residual blood clot and 7 mm of midline shift. Electronically Signed   By: Tiburcio Pea M.D.   On: 01/27/2023 04:59     Assessment/Plan: LEft subdural hematoma following fall Encephalopathy due to the above- rt midline shift- s/p evacuation on 01/17/23 ETOH intoxication   MSSA bacteremia with sepsis- source of the bacteremia could be from recent procedure Need to r/o infected hematoma ??? 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

## 2023-01-28 NOTE — Telephone Encounter (Signed)
Patient currently admitted
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
## 2023-01-28 NOTE — Progress Notes (Addendum)
Attending Progress Note  History: Samuel Riley is here for subdural hematoma status POD 10 s/p left-sided craniotomy for subdural hematoma evacuation, has been in the ICU postoperatively, now under the care of the hospitalist team.   24-hour events: 12/1: Patient with MRSA bacteremia, LP done and CSF studies overall negative for bacterial infection. Neurologically stable today with mild headache.    Hospital Course: 11/30: Exam this morning remains stable but patient has been more tachycardic and possible febrile given he has been on Tylenol.  His labs show increasing white count and primary team has started evaluation for infection and given broad-spectrum antibiotics.  They requested LP to rule out meningitis and we will perform that today.  Physical Exam: Vitals:   01/28/23 0423 01/28/23 0843  BP: (!) 146/102 (!) 146/80  Pulse: (!) 108 (!) 107  Resp: 16 18  Temp: 99.2 F (37.3 C) 98.5 F (36.9 C)  SpO2: 99% 97%      Data:  Recent Labs  Lab 01/26/23 0508 01/27/23 0510 01/28/23 0419  NA 124* 130* 129*  K 3.5 3.6 3.3*  CL 92* 96* 89*  CO2 23 27 30   BUN 5* 10 <5*  CREATININE 0.72 0.66 0.81  GLUCOSE 147* 118* 99  CALCIUM 7.8* 7.8* 7.7*   Recent Labs  Lab 01/27/23 1630  AST 35  ALT 39  ALKPHOS 84     Recent Labs  Lab 01/26/23 0508 01/27/23 0510 01/28/23 0419  WBC 29.6* 30.1* 22.9*  HGB 10.1* 10.1* 9.6*  HCT 28.1* 28.5* 26.6*  PLT 962* 981* 1,015*   No results for input(s): "APTT", "INR" in the last 168 hours.        Other tests/results: CT head 11/30: There is stable extra-axial blood products that do not appear to be under pressure measuring up to 11 mm.  There is some improvement of the midline shift to 7 mm.  Assessment/Plan:  Samuel Riley 52 year old man with a history of chronic alcoholism who was admitted with a large left-sided subdural hematoma.  He had a subdural hematoma evacuation on 01/17/2023.  He is currently postoperative day  10 -Incision remains clean and dry, appropriately healing - DVT prophylaxis OK -Medical team managing his hyponatremia and alcoholism, treating new bacteremia  Lucy Chris, MD Department of Neurosurgery

## 2023-01-29 ENCOUNTER — Inpatient Hospital Stay: Payer: BC Managed Care – PPO

## 2023-01-29 DIAGNOSIS — M7989 Other specified soft tissue disorders: Secondary | ICD-10-CM

## 2023-01-29 DIAGNOSIS — B9561 Methicillin susceptible Staphylococcus aureus infection as the cause of diseases classified elsewhere: Secondary | ICD-10-CM | POA: Diagnosis not present

## 2023-01-29 DIAGNOSIS — D72829 Elevated white blood cell count, unspecified: Secondary | ICD-10-CM

## 2023-01-29 DIAGNOSIS — R7881 Bacteremia: Secondary | ICD-10-CM | POA: Diagnosis not present

## 2023-01-29 DIAGNOSIS — S065XAA Traumatic subdural hemorrhage with loss of consciousness status unknown, initial encounter: Secondary | ICD-10-CM | POA: Diagnosis not present

## 2023-01-29 LAB — CULTURE, BLOOD (ROUTINE X 2)

## 2023-01-29 LAB — CBC
HCT: 25.5 % — ABNORMAL LOW (ref 39.0–52.0)
Hemoglobin: 9.1 g/dL — ABNORMAL LOW (ref 13.0–17.0)
MCH: 33.6 pg (ref 26.0–34.0)
MCHC: 35.7 g/dL (ref 30.0–36.0)
MCV: 94.1 fL (ref 80.0–100.0)
Platelets: 1033 10*3/uL (ref 150–400)
RBC: 2.71 MIL/uL — ABNORMAL LOW (ref 4.22–5.81)
RDW: 14 % (ref 11.5–15.5)
WBC: 16.6 10*3/uL — ABNORMAL HIGH (ref 4.0–10.5)
nRBC: 0.6 % — ABNORMAL HIGH (ref 0.0–0.2)

## 2023-01-29 LAB — BASIC METABOLIC PANEL
Anion gap: 8 (ref 5–15)
BUN: <5 mg/dL — ABNORMAL LOW (ref 6–20)
CO2: 32 mmol/L (ref 22–32)
Calcium: 7.6 mg/dL — ABNORMAL LOW (ref 8.9–10.3)
Chloride: 89 mmol/L — ABNORMAL LOW (ref 98–111)
Creatinine, Ser: 0.65 mg/dL (ref 0.61–1.24)
GFR, Estimated: >60 mL/min (ref >60)
Glucose, Bld: 100 mg/dL — ABNORMAL HIGH (ref 70–99)
Potassium: 2.9 mmol/L — ABNORMAL LOW (ref 3.5–5.1)
Sodium: 129 mmol/L — ABNORMAL LOW (ref 135–145)

## 2023-01-29 LAB — CSF CULTURE W GRAM STAIN: Gram Stain: NONE SEEN

## 2023-01-29 LAB — MAGNESIUM: Magnesium: 2.2 mg/dL (ref 1.7–2.4)

## 2023-01-29 MED ORDER — POTASSIUM CHLORIDE CRYS ER 20 MEQ PO TBCR
40.0000 meq | EXTENDED_RELEASE_TABLET | Freq: Two times a day (BID) | ORAL | Status: AC
  Administered 2023-01-29 (×2): 40 meq via ORAL
  Filled 2023-01-29: qty 4
  Filled 2023-01-29: qty 2

## 2023-01-29 MED ORDER — OXYCODONE HCL 5 MG PO TABS
5.0000 mg | ORAL_TABLET | Freq: Once | ORAL | Status: AC
  Administered 2023-01-29: 5 mg via ORAL
  Filled 2023-01-29: qty 1

## 2023-01-29 MED ORDER — ENSURE ENLIVE PO LIQD
237.0000 mL | Freq: Two times a day (BID) | ORAL | Status: DC
  Administered 2023-01-29 – 2023-02-04 (×12): 237 mL via ORAL

## 2023-01-29 NOTE — Plan of Care (Signed)

## 2023-01-29 NOTE — Progress Notes (Signed)
Inpatient Rehab Admissions Coordinator:   BCBS policy updated and effective 01/27/23 through 06/26/24.  I will reopen insurance request once OT has a chance to see patient.   Estill Dooms, PT, DPT Admissions Coordinator [phone] 01/29/23  8:38 AM

## 2023-01-29 NOTE — Progress Notes (Signed)
Date of Admission:  01/17/2023     ID: Samuel Riley is a 52 y.o. male Principal Problem:   Subdural hematoma (HCC) Active Problems:   Alcohol abuse   Right sided weakness   Traumatic brain compression with herniation, initial encounter (HCC)   Alcoholic intoxication without complication (HCC)   Alcohol use disorder, severe, dependence (HCC)   Pressure injury of skin   Bacteremia due to methicillin susceptible Staphylococcus aureus (MSSA)    Subjective: Pt has rt calf pain  when walking  Medications:   vitamin C  500 mg Oral BID   divalproex  500 mg Oral QHS   divalproex  750 mg Oral Daily   enoxaparin (LOVENOX) injection  40 mg Subcutaneous QHS   feeding supplement  237 mL Oral BID BM   folic acid  1 mg Oral Daily   metoprolol tartrate  12.5 mg Oral BID   multivitamin with minerals  1 tablet Oral Daily   pantoprazole  40 mg Oral Daily   potassium chloride  40 mEq Oral BID   thiamine  100 mg Oral Daily   Vitamin D (Ergocalciferol)  50,000 Units Oral Q7 days    Objective: Vital signs in last 24 hours: Patient Vitals for the past 24 hrs:  BP Temp Temp src Pulse Resp SpO2  01/29/23 1222 -- 97.9 F (36.6 C) Oral -- 20 --  01/29/23 1219 119/71 -- -- -- (!) 25 --  01/29/23 0820 130/80 -- -- 97 -- --  01/29/23 0812 130/80 -- -- -- -- --  01/29/23 0732 (!) 137/93 99.5 F (37.5 C) -- (!) 107 18 97 %  01/28/23 2348 124/79 98.3 F (36.8 C) -- 89 18 99 %  01/28/23 1934 121/79 98.9 F (37.2 C) -- 88 17 98 %  01/28/23 1817 -- 98.5 F (36.9 C) Oral -- -- --  01/28/23 1644 109/68 (!) 101 F (38.3 C) Oral -- 20 97 %      PHYSICAL EXAM:  General: Alert, cooperative, no distress, appears stated age.  Head: surgical site left hemicranium clean , no discharge , no erythema Eyes: Conjunctivae clear, anicteric sclerae. Pupils are equal ENT Nares normal. No drainage or sinus tenderness. Lips, mucosa, and tongue normal. No Thrush Neck: Supple, symmetrical, no adenopathy,  thyroid: non tender no carotid bruit and no JVD. Back: No CVA tenderness. Lungs: Clear to auscultation bilaterally. No Wheezing or Rhonchi. No rales. Heart: Regular rate and rhythm, no murmur, rub or gallop. Abdomen: Soft, non-tender,not distended. Bowel sounds normal. No masses Extremities: rt leg- calf area swollen, erythematous skin, tender  Skin: No rashes or lesions. Or bruising Scab on his rt foot- no erytthema Lymph: Cervical, supraclavicular normal. Neurologic: Grossly non-focal  Lab Results    Latest Ref Rng & Units 01/29/2023    5:41 AM 01/28/2023    4:19 AM 01/27/2023    5:10 AM  CBC  WBC 4.0 - 10.5 K/uL 16.6  22.9  30.1   Hemoglobin 13.0 - 17.0 g/dL 9.1  9.6  41.3   Hematocrit 39.0 - 52.0 % 25.5  26.6  28.5   Platelets 150 - 400 K/uL 1,033  1,015  981        Latest Ref Rng & Units 01/29/2023    5:41 AM 01/28/2023    4:19 AM 01/27/2023    4:30 PM  CMP  Glucose 70 - 99 mg/dL 244  99    BUN 6 - 20 mg/dL <5  <5    Creatinine 0.10 -  1.24 mg/dL 1.61  0.96    Sodium 045 - 145 mmol/L 129  129    Potassium 3.5 - 5.1 mmol/L 2.9  3.3    Chloride 98 - 111 mmol/L 89  89    CO2 22 - 32 mmol/L 32  30    Calcium 8.9 - 10.3 mg/dL 7.6  7.7    Total Protein 6.5 - 8.1 g/dL   5.8   Total Bilirubin <1.2 mg/dL   0.6   Alkaline Phos 38 - 126 U/L   84   AST 15 - 41 U/L   35   ALT 0 - 44 U/L   39       Microbiology: [facility] 01/26/23 MSSa 01/28/23- Bc Studies/Results: ECHOCARDIOGRAM COMPLETE  Result Date: 01/28/2023    ECHOCARDIOGRAM REPORT   Patient Name:   Samuel Riley Date of Exam: 01/28/2023 Medical Rec #:  [id number]         Height:       68.0 in Accession #:    [id number]        Weight:       151.7 lb Date of Birth:  [birth year]         BSA:          1.817 m Patient Age:    52 years          BP:           013/80 mmHg Patient Gender: M                 HR:           103 bpm. Exam Location:  [facility] Procedure: 2D Echo, Cardiac Doppler and Color Doppler Indications:     Bacteremia   History:         Patient has no prior history of Echocardiogram examinations.                  Signs/Symptoms:Bacteremia; Risk Factors:Hypertension,                  Dyslipidemia and Current Smoker. ETOH abuse.  Sonographer:     Mikki Harbor Referring Phys:  [id number] Sunnie Nielsen Diagnosing Phys: Lorine Bears MD IMPRESSIONS  1. Left ventricular ejection fraction, by estimation, is 65 to 70%. The left ventricle has normal function. The left ventricle has no regional wall motion abnormalities. There is mild left ventricular hypertrophy. Left ventricular diastolic parameters were normal.  2. Right ventricular systolic function is normal. The right ventricular size is normal. There is normal pulmonary artery systolic pressure.  3. The mitral valve is normal in structure. Mild mitral valve regurgitation. No evidence of mitral stenosis.  4. The aortic valve is normal in structure. Aortic valve regurgitation is not visualized. Aortic valve sclerosis is present, with no evidence of aortic valve stenosis.  5. The inferior vena cava is normal in size with greater than 50% respiratory variability, suggesting right atrial pressure of 3 mmHg. Conclusion(s)/Recommendation(s): No evidence of valvular vegetations on this transthoracic echocardiogram. FINDINGS  Left Ventricle: Left ventricular ejection fraction, by estimation, is 65 to 70%. The left ventricle has normal function. The left ventricle has no regional wall motion abnormalities. The left ventricular internal cavity size was normal in size. There is  mild left ventricular hypertrophy. Left ventricular diastolic parameters were normal. Right Ventricle: The right ventricular size is normal. No increase in right ventricular wall thickness. Right ventricular systolic function is normal. There is normal pulmonary artery systolic pressure. The tricuspid regurgitant velocity  is 1.95 m/s, and  with an assumed right atrial pressure of 3 mmHg, the estimated right  ventricular systolic pressure is 18.2 mmHg. Left Atrium: Left atrial size was normal in size. Right Atrium: Right atrial size was normal in size. Pericardium: There is no evidence of pericardial effusion. Mitral Valve: The mitral valve is normal in structure. Mild mitral valve regurgitation. No evidence of mitral valve stenosis. MV peak gradient, 3.9 mmHg. The mean mitral valve gradient is 2.0 mmHg. Tricuspid Valve: The tricuspid valve is normal in structure. Tricuspid valve regurgitation is trivial. No evidence of tricuspid stenosis. Aortic Valve: The aortic valve is normal in structure. Aortic valve regurgitation is not visualized. Aortic valve sclerosis is present, with no evidence of aortic valve stenosis. Aortic valve mean gradient measures 9.0 mmHg. Aortic valve peak gradient measures 17.6 mmHg. Aortic valve area, by VTI measures 3.04 cm. Pulmonic Valve: The pulmonic valve was normal in structure. Pulmonic valve regurgitation is not visualized. No evidence of pulmonic stenosis. Aorta: The aortic root is normal in size and structure. Venous: The inferior vena cava is normal in size with greater than 50% respiratory variability, suggesting right atrial pressure of 3 mmHg. IAS/Shunts: No atrial level shunt detected by color flow Doppler.  LEFT VENTRICLE PLAX 2D LVIDd:         5.10 cm   Diastology LVIDs:         3.10 cm   LV e' medial:    11.00 cm/s LV PW:         0.90 cm   LV E/e' medial:  7.9 LV IVS:        1.20 cm   LV e' lateral:   13.50 cm/s LVOT diam:     2.00 cm   LV E/e' lateral: 6.4 LV SV:         113 LV SV Index:   62 LVOT Area:     3.14 cm  RIGHT VENTRICLE RV Basal diam:  3.65 cm RV Mid diam:    3.10 cm RV S prime:     20.40 cm/s TAPSE (M-mode): 3.0 cm LEFT ATRIUM             Index        RIGHT ATRIUM           Index LA diam:        3.50 cm 1.93 cm/m   RA Area:     16.00 cm LA Vol (A2C):   66.7 ml 36.70 ml/m  RA Volume:   40.90 ml  22.51 ml/m LA Vol (A4C):   42.3 ml 23.28 ml/m LA Biplane Vol:  58.0 ml 31.92 ml/m  AORTIC VALVE                     PULMONIC VALVE AV Area (Vmax):    2.69 cm      PV Vmax:       1.15 m/s AV Area (Vmean):   3.21 cm      PV Peak grad:  5.3 mmHg AV Area (VTI):     3.04 cm AV Vmax:           210.00 cm/s AV Vmean:          132.000 cm/s AV VTI:            0.372 m AV Peak Grad:      17.6 mmHg AV Mean Grad:      9.0 mmHg LVOT Vmax:  180.00 cm/s LVOT Vmean:        135.000 cm/s LVOT VTI:          0.360 m LVOT/AV VTI ratio: 0.97  AORTA Ao Root diam: 3.70 cm MITRAL VALVE               TRICUSPID VALVE MV Area (PHT): 3.05 cm    TR Peak grad:   15.2 mmHg MV Area VTI:   4.96 cm    TR Vmax:        195.00 cm/s MV Peak grad:  3.9 mmHg MV Mean grad:  2.0 mmHg    SHUNTS MV Vmax:       0.98 m/s    Systemic VTI:  0.36 m MV Vmean:      67.7 cm/s   Systemic Diam: 2.00 cm MV Decel Time: 249 msec MV E velocity: 87.00 cm/s MV A velocity: 69.30 cm/s MV E/A ratio:  1.26 Lorine Bears MD Electronically signed by Lorine Bears MD Signature Date/Time: 01/28/2023/1:00:11 PM    Final      Assessment/Plan: LEft subdural hematoma following fall Encephalopathy due to the above- rt midline shift- s/p evacuation on 01/17/23 ETOH intoxication   MSSA bacteremia with sepsis- pt has tender swelling rt calf- need to r/o abscess-  Will need CT scan Pt on nafcillin Repeat blood culture  2 d echo no valvular vegetation    No evidence of meningitis clinically currently Abnormal CSF - with increased rbc is due to recent surgery/subdural hematoma- csf culture is pending 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

## 2023-01-29 NOTE — Progress Notes (Addendum)
OT Cancellation Note  Patient Details Name: Kenzi Zambo MRN: [id number] DOB: 03-Nov-1970   Cancelled Treatment:    Reason Eval/Treat Not Completed: Medical issues which prohibited therapy;Other (comment). OT continues to follow pt for therapy services. Upon arrival to pt room, MD exiting room as OT arrived. Per MD pt going to go for ultrasound to r/o DVT. Per protocols will hold until ultrasound returns. OT will continue to follow remotely. And resume services once pt medically cleared.   Rockney Ghee, M.S., OTR/L 01/29/23, 10:54 AM

## 2023-01-29 NOTE — Progress Notes (Signed)
   01/29/23 0802  RLE Neurovascular Assessment  RLE Capillary Refill  Less than/equal to 3 seconds  RLE Color  Flushed  RLE Temperature/Moisture  Hot  R Popliteal Pulse +1  R Posterior Tibial Pulse +1  RLE  Denna Haggard' Sign Positive     Primary team informed, see new orders.

## 2023-01-29 NOTE — Progress Notes (Signed)
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 Lorine Bears MD Electronically signed by Lorine Bears MD Signature Date/Time: 01/28/2023/1:00:11 PM    Final    CT HEAD WO CONTRAST ( )  Result Date: 01/27/2023 CLINICAL DATA:  Meningitis/CNS infection suspected. EXAM: CT HEAD WITHOUT CONTRAST TECHNIQUE: Contiguous axial images were obtained from the base of the skull through the vertex without intravenous contrast. RADIATION DOSE REDUCTION: This exam was performed according to the departmental dose-optimization program  which includes automated exposure control, adjustment of the mA and/or kV according to patient size and/or use of iterative reconstruction technique. COMPARISON:  Yesterday FINDINGS: Brain: Subdural hematoma with evacuation on the left. Maximal thickness is at the frontal convexity anteriorly, measuring 12 mm. Local cerebral mass effect with midline shift of 7 mm, unchanged. No new hemorrhage. No hydrocephalus, infarct, or mass. Stable pneumocephalus. Vascular: No hyperdense vessel or unexpected calcification. Skull: Unremarkable left-sided craniotomy Sinuses/Orbits: Negative IMPRESSION: Left subdural hematoma with recent evacuation. Unchanged residual blood clot and 7 mm of midline shift. Electronically Signed   By: Tiburcio Pea M.D.   On: 01/27/2023 04:59   CT HEAD WO CONTRAST ( )  Result Date: 01/26/2023 CLINICAL DATA:  Head trauma, moderate to severe. Follow-up craniotomy for subdural hemorrhage. EXAM: CT HEAD WITHOUT CONTRAST TECHNIQUE: Contiguous axial images were obtained from the base of the skull through the vertex without intravenous contrast. RADIATION DOSE REDUCTION: This exam was performed according to the departmental dose-optimization program which includes automated exposure control, adjustment of the mA and/or kV according to patient size and/or use of iterative reconstruction technique. COMPARISON:  Seven days prior FINDINGS: Brain: Subdural hematoma on the left shows decrease in pneumocephalus. A drain has also been removed. There is residual high-density blood clot measuring up to 11 mm in thickness along the frontal convexity. Midline shift is 7 mm towards the right. No entrapment or infarct. Vascular: No hyperdense vessel or unexpected calcification. Skull: Unremarkable recent craniotomy site. Sinuses/Orbits: Unremarkable IMPRESSION: Drain removal and resolving postoperative changes at the left subdural hematoma evacuation site. Maximal clot thickness is along the left frontal lobe at  11 mm. Midline shift is similar to prior at 7 mm. Electronically Signed   By: Tiburcio Pea M.D.   On: 01/26/2023 10:19           Sunnie Nielsen, DO [facility] 01/29/2023, 2:08 PM    Dictation software may have been used to generate the above note. Typos may occur and escape review in typed/dictated notes. Please contact Dr Lyn Hollingshead directly for clarity if needed.  Staff may message me via secure chat in Epic  but this may not receive an immediate response,  please page me for urgent matters!  If 7PM-7AM, please contact night coverage [email or web address]

## 2023-01-29 NOTE — Plan of Care (Signed)
  Problem: Health Behavior/Discharge Planning: Goal: Ability to manage health-related needs will improve Outcome: Progressing   Problem: Clinical Measurements: Goal: Ability to maintain clinical measurements within normal limits will improve Outcome: Progressing Goal: Will remain free from infection Outcome: Progressing Goal: Diagnostic test results will improve Outcome: Progressing Goal: Respiratory complications will improve Outcome: Progressing Goal: Cardiovascular complication will be avoided Outcome: Progressing   Problem: Activity: Goal: Risk for activity intolerance will decrease Outcome: Progressing   Problem: Nutrition: Goal: Adequate nutrition will be maintained Outcome: Progressing   Problem: Coping: Goal: Level of anxiety will decrease Outcome: Progressing   Problem: Elimination: Goal: Will not experience complications related to bowel motility Outcome: Progressing Goal: Will not experience complications related to urinary retention Outcome: Progressing   Problem: Pain Management: Goal: General experience of comfort will improve Outcome: Progressing   Problem: Safety: Goal: Ability to remain free from injury will improve Outcome: Progressing   Problem: Skin Integrity: Goal: Risk for impaired skin integrity will decrease Outcome: Progressing   Problem: Education: Goal: Ability to describe self-care measures that may prevent or decrease complications (Diabetes Survival Skills Education) will improve Outcome: Progressing Goal: Individualized Educational Video(s) Outcome: Progressing   Problem: Coping: Goal: Ability to adjust to condition or change in health will improve Outcome: Progressing   Problem: Fluid Volume: Goal: Ability to maintain a balanced intake and output will improve Outcome: Progressing   Problem: Health Behavior/Discharge Planning: Goal: Ability to identify and utilize available resources and services will improve Outcome:  Progressing Goal: Ability to manage health-related needs will improve Outcome: Progressing   Problem: Metabolic: Goal: Ability to maintain appropriate glucose levels will improve Outcome: Progressing   Problem: Nutritional: Goal: Maintenance of adequate nutrition will improve Outcome: Progressing Goal: Progress toward achieving an optimal weight will improve Outcome: Progressing   Problem: Skin Integrity: Goal: Risk for impaired skin integrity will decrease Outcome: Progressing   Problem: Tissue Perfusion: Goal: Adequacy of tissue perfusion will improve Outcome: Progressing   Problem: Activity: Goal: Ability to tolerate increased activity will improve Outcome: Progressing   Problem: Respiratory: Goal: Ability to maintain a clear airway and adequate ventilation will improve Outcome: Progressing   Problem: Role Relationship: Goal: Method of communication will improve Outcome: Progressing
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
## 2023-01-30 ENCOUNTER — Encounter: Payer: BC Managed Care – PPO | Admitting: Physician Assistant

## 2023-01-30 ENCOUNTER — Inpatient Hospital Stay: Payer: BC Managed Care – PPO

## 2023-01-30 DIAGNOSIS — S065XAA Traumatic subdural hemorrhage with loss of consciousness status unknown, initial encounter: Secondary | ICD-10-CM | POA: Diagnosis not present

## 2023-01-30 LAB — CBC
HCT: 25.9 % — ABNORMAL LOW (ref 39.0–52.0)
Hemoglobin: 9.1 g/dL — ABNORMAL LOW (ref 13.0–17.0)
MCH: 34.5 pg — ABNORMAL HIGH (ref 26.0–34.0)
MCHC: 35.1 g/dL (ref 30.0–36.0)
MCV: 98.1 fL (ref 80.0–100.0)
Platelets: 1013 10*3/uL (ref 150–400)
RBC: 2.64 MIL/uL — ABNORMAL LOW (ref 4.22–5.81)
RDW: 14.8 % (ref 11.5–15.5)
WBC: 19.2 10*3/uL — ABNORMAL HIGH (ref 4.0–10.5)
nRBC: 0.2 % (ref 0.0–0.2)

## 2023-01-30 LAB — BASIC METABOLIC PANEL
Anion gap: 9 (ref 5–15)
BUN: <5 mg/dL — ABNORMAL LOW (ref 6–20)
CO2: 29 mmol/L (ref 22–32)
Calcium: 7.6 mg/dL — ABNORMAL LOW (ref 8.9–10.3)
Chloride: 94 mmol/L — ABNORMAL LOW (ref 98–111)
Creatinine, Ser: 0.73 mg/dL (ref 0.61–1.24)
GFR, Estimated: >60 mL/min (ref >60)
Glucose, Bld: 157 mg/dL — ABNORMAL HIGH (ref 70–99)
Potassium: 3.5 mmol/L (ref 3.5–5.1)
Sodium: 132 mmol/L — ABNORMAL LOW (ref 135–145)

## 2023-01-30 LAB — MAGNESIUM: Magnesium: 2.3 mg/dL (ref 1.7–2.4)

## 2023-01-30 MED ORDER — IOHEXOL 300 MG/ML  SOLN
100.0000 mL | Freq: Once | INTRAMUSCULAR | Status: AC | PRN
  Administered 2023-01-30: 100 mL via INTRAVENOUS

## 2023-01-30 MED ORDER — ACETAMINOPHEN 325 MG PO TABS
650.0000 mg | ORAL_TABLET | Freq: Four times a day (QID) | ORAL | Status: DC | PRN
  Administered 2023-01-30 – 2023-02-04 (×18): 650 mg via ORAL
  Filled 2023-01-30 (×19): qty 2

## 2023-01-30 MED ORDER — POLYSACCHARIDE IRON COMPLEX 150 MG PO CAPS
150.0000 mg | ORAL_CAPSULE | Freq: Every day | ORAL | Status: DC
  Administered 2023-01-30 – 2023-02-04 (×5): 150 mg via ORAL
  Filled 2023-01-30 (×6): qty 1

## 2023-01-30 MED ORDER — METHOCARBAMOL 500 MG PO TABS: 500.0000 mg | ORAL_TABLET | Freq: Three times a day (TID) | ORAL | Status: AC | PRN

## 2023-01-30 MED ORDER — METHOCARBAMOL 500 MG PO TABS
500.0000 mg | ORAL_TABLET | Freq: Three times a day (TID) | ORAL | Status: AC
  Administered 2023-01-30 – 2023-02-01 (×6): 500 mg via ORAL
  Filled 2023-01-30 (×6): qty 1

## 2023-01-30 NOTE — Progress Notes (Signed)
Inpatient Rehab Admissions Coordinator:   Insurance auth pending.  We will follow.  Estill Dooms, PT, DPT Admissions Coordinator [phone] 01/30/23  2:01 PM

## 2023-01-30 NOTE — Progress Notes (Signed)
Physical Therapy Treatment Patient Details Name: Samuel Riley MRN: [id number] DOB: 1970/09/17 Today's Date: 01/30/2023   History of Present Illness Pt is a 52 year old male presenting to the ED with alcohol intoxication and multiple falls while intoxicated. Pt is admitted with subdural hematoma, status post fall S/p left craniotomy for hematoma evacuation done on 11/21    PT Comments  Progressive increase in gait distance and overall activity tolerance.  Continues to demonstrate higher-level, dynamic balance deficits, requiring frequent supervision/assist to optimize safety and indep. Patient requiring mod assist for short-term memory, problem-solving and sequencing throughout activity.  Appropriate social interaction in 1:1 context, but easily frustrated with more demanding (dual task) activities.  Appropriately redirectable; does appear to have more emerging awareness of cognitive deficits (receptive to activities to challenge/address).   Motivated to participate and progress; very receptive to cuing/education from therapist.   Continue to recommend high-intensity, specialized rehab services in post-acute setting to maximize return to PLOF   If plan is discharge home, recommend the following: Assistance with cooking/housework;Assist for transportation;Help with stairs or ramp for entrance;Supervision due to cognitive status   Can travel by private vehicle        Equipment Recommendations  None recommended by PT    Recommendations for Other Services       Precautions / Restrictions Precautions Precautions: Fall Restrictions Weight Bearing Restrictions: No     Mobility  Bed Mobility Overal bed mobility: Independent Bed Mobility: Supine to Sit, Sit to Supine     Supine to sit: Independent Sit to supine: Independent        Transfers Overall transfer level: Needs assistance Equipment used: None Transfers: Sit to/from Stand Sit to Stand: Supervision            General transfer comment: slightly impulsive with all transitional movements    Ambulation/Gait Ambulation/Gait assistance: Supervision, Contact guard assist             General Gait Details: partially reciprocal stepping pattern, largely limited by R calf pain.  Increased time/effort for R heel strike/toe off throughout gait cycle, improving with distance.  Slow and guarded cadence. Moderate gait deviation/sway with dynamic head turns, often ceasing gait all together with vertical head movement (to maintain balance).  Limited ability to modulate speed.  Higher-level balance deficits persist   Stairs             Wheelchair Mobility     Tilt Bed    Modified Rankin (Stroke Patients Only)       Balance Overall balance assessment: Needs assistance Sitting-balance support: No upper extremity supported, Feet supported Sitting balance-Leahy Scale: Normal     Standing balance support: No upper extremity supported Standing balance-Leahy Scale: Good                 High Level Balance Comments: impulsive, limited insight into deficits and limits of stability            Cognition Arousal: Alert Behavior During Therapy: WFL for tasks assessed/performed, Impulsive Overall Cognitive Status: Impaired/Different from baseline                                 General Comments: slightly impulsive, but improved from previous sessions; continued difficulty with short-term memory, sequencing and processing; decreased frustration-tolerance, but redirectable        Exercises Other Exercises Other Exercises: Participated with functional scavenger hunt, incorporating various cognitive challenges and dynamic balance activities (  direction changes, pathway/obstacle negotiation, bimanual corodination tasks with gait).  Patient requiring mod assist for short-term memory, problem-solving and sequencing throughout activity.  Appropriate social interaction in 1:1  context, but easily frustrated with more demanding (dual task) activities.  Appropriately redirectable; does appear to have more emerging awareness of cognitive deficits (receptive to activities to challenge/address)    General Comments        Pertinent Vitals/Pain Pain Assessment Pain Assessment: Faces Faces Pain Scale: Hurts little more Pain Location: R calf Pain Descriptors / Indicators: Aching, Cramping Pain Intervention(s): Limited activity within patient's tolerance, Monitored during session, Repositioned    Home Living                          Prior Function            PT Goals (current goals can now be found in the care plan section) Acute Rehab PT Goals Patient Stated Goal: to go home as soon as possible PT Goal Formulation: With patient Time For Goal Achievement: 02/05/23 Potential to Achieve Goals: Fair Progress towards PT goals: Progressing toward goals    Frequency    Min 1X/week      PT Plan      Co-evaluation              AM-PAC PT "6 Clicks" Mobility   Outcome Measure  Help needed turning from your back to your side while in a flat bed without using bedrails?: None Help needed moving from lying on your back to sitting on the side of a flat bed without using bedrails?: None Help needed moving to and from a bed to a chair (including a wheelchair)?: None Help needed standing up from a chair using your arms (e.g., wheelchair or bedside chair)?: None Help needed to walk in hospital room?: A Little Help needed climbing 3-5 steps with a railing? : A Little 6 Click Score: 22    End of Session Equipment Utilized During Treatment: Gait belt Activity Tolerance: Patient tolerated treatment well Patient left: in bed;with call bell/phone within reach;with bed alarm set Nurse Communication: Mobility status PT Visit Diagnosis: Muscle weakness (generalized) (M62.81);Unsteadiness on feet (R26.81)     Time: 1203-1230 PT Time Calculation (min)  (ACUTE ONLY): 27 min  Charges:    $Gait Training: 8-22 mins $Neuromuscular Re-education: 8-22 mins PT General Charges $$ ACUTE PT VISIT: 1 Visit                    [name] H. Manson Passey, PT, DPT, NCS 01/30/23, 1:29 PM [phone]

## 2023-01-30 NOTE — Plan of Care (Signed)
  Problem: Health Behavior/Discharge Planning: Goal: Ability to manage health-related needs will improve Outcome: Progressing   Problem: Clinical Measurements: Goal: Ability to maintain clinical measurements within normal limits will improve Outcome: Progressing Goal: Will remain free from infection Outcome: Progressing Goal: Diagnostic test results will improve Outcome: Progressing Goal: Respiratory complications will improve Outcome: Progressing Goal: Cardiovascular complication will be avoided Outcome: Progressing   Problem: Activity: Goal: Risk for activity intolerance will decrease Outcome: Progressing   Problem: Nutrition: Goal: Adequate nutrition will be maintained Outcome: Progressing   Problem: Coping: Goal: Level of anxiety will decrease Outcome: Progressing   Problem: Elimination: Goal: Will not experience complications related to bowel motility Outcome: Progressing Goal: Will not experience complications related to urinary retention Outcome: Progressing   Problem: Pain Management: Goal: General experience of comfort will improve Outcome: Progressing   Problem: Safety: Goal: Ability to remain free from injury will improve Outcome: Progressing   Problem: Skin Integrity: Goal: Risk for impaired skin integrity will decrease Outcome: Progressing   Problem: Education: Goal: Ability to describe self-care measures that may prevent or decrease complications (Diabetes Survival Skills Education) will improve Outcome: Progressing Goal: Individualized Educational Video(s) Outcome: Progressing   Problem: Coping: Goal: Ability to adjust to condition or change in health will improve Outcome: Progressing   Problem: Fluid Volume: Goal: Ability to maintain a balanced intake and output will improve Outcome: Progressing   Problem: Health Behavior/Discharge Planning: Goal: Ability to identify and utilize available resources and services will improve Outcome:  Progressing Goal: Ability to manage health-related needs will improve Outcome: Progressing   Problem: Metabolic: Goal: Ability to maintain appropriate glucose levels will improve Outcome: Progressing   Problem: Nutritional: Goal: Maintenance of adequate nutrition will improve Outcome: Progressing Goal: Progress toward achieving an optimal weight will improve Outcome: Progressing   Problem: Skin Integrity: Goal: Risk for impaired skin integrity will decrease Outcome: Progressing   Problem: Tissue Perfusion: Goal: Adequacy of tissue perfusion will improve Outcome: Progressing   Problem: Activity: Goal: Ability to tolerate increased activity will improve Outcome: Progressing   Problem: Respiratory: Goal: Ability to maintain a clear airway and adequate ventilation will improve Outcome: Progressing   Problem: Role Relationship: Goal: Method of communication will improve Outcome: Progressing

## 2023-01-30 NOTE — Progress Notes (Signed)
Occupational Therapy Treatment Patient Details Name: Samuel Riley MRN: [id number] DOB: [birth year] Today's Date: 01/30/2023   History of present illness Pt is a 52 year old male presenting to the ED with alcohol intoxication and multiple falls while intoxicated. Pt is admitted with subdural hematoma, status post fall S/p left craniotomy for hematoma evacuation done on 11/21   OT comments  Chart reviewed to date, pt greeted in room, agreeable to OT tx session targeting improving safe ADL performance. Improvements noted in cognition as compared to previous sessions with pt becoming increasingly self aware of deficits; Impairments continue to be noted however with fair frustration tolerance and decreased safe ADL mobility/completion noted with dual tasking requring increased vcs for safety. Inconsistent multi step direction following noted. Shower orders received with pt performing shower transfer with CGA due to uneven surface and intermittent vcs required for fall prevention. Bathing completed with use of shower chair with supervision, with vcs again required for fall prevention due to mild impulsiveness noted. LB dressing completed with MIN A due to one LOB while attempting to donn pants. UB dressing completed with supervision. Simulated IADL tasks performed with pt picking items up off the floor, performing with CGA when required to dual task. Pt is making progress towards goals, OT will continue to follow acutely.       If plan is discharge home, recommend the following:  A little help with bathing/dressing/bathroom;Assistance with cooking/housework;Direct supervision/assist for medications management;Assist for transportation;Supervision due to cognitive status;Help with stairs or ramp for entrance;Direct supervision/assist for financial management   Equipment Recommendations  None recommended by OT    Recommendations for Other Services      Precautions / Restrictions  Precautions Precautions: Fall Restrictions Weight Bearing Restrictions: No       Mobility Bed Mobility Overal bed mobility: Independent Bed Mobility: Supine to Sit, Sit to Supine     Supine to sit: Independent Sit to supine: Independent        Transfers Overall transfer level: Needs assistance Equipment used: None Transfers: Sit to/from Stand Sit to Stand: Supervision                 Balance Overall balance assessment: Needs assistance Sitting-balance support: No upper extremity supported, Feet supported Sitting balance-Leahy Scale: Normal     Standing balance support: No upper extremity supported Standing balance-Leahy Scale: Good                             ADL either performed or assessed with clinical judgement   ADL Overall ADL's : Needs assistance/impaired     Grooming: Wash/dry face;Sitting;Supervision/safety   Upper Body Bathing: Supervision/ safety;Sitting Upper Body Bathing Details (indicate cue type and reason): intermittent vcs for safety Lower Body Bathing: Supervison/ safety;Sit to/from stand Lower Body Bathing Details (indicate cue type and reason): intermittent vcs for safety Upper Body Dressing : Supervision/safety;Standing   Lower Body Dressing: Minimal assistance;Sit to/from stand Lower Body Dressing Details (indicate cue type and reason): underwear, pants, socks Toilet Transfer: Supervision/safety;Contact guard assist Toilet Transfer Details (indicate cue type and reason): with no AD, intermittent vcs for safety/technique Toileting- Clothing Manipulation and Hygiene: Minimal assistance;Sit to/from stand   Tub/ Forensic scientist Details (indicate cue type and reason): into bathroom shower with shower chair Functional mobility during ADLs: Supervision/safety General ADL Comments: Simulated IADL of ADL prep of picking items up off the floor 5 attempts with CGA     Extremity/Trunk  Assessment              Vision       Perception     Praxis      Cognition Arousal: Alert Behavior During Therapy: Impulsive Overall Cognitive Status: Impaired/Different from baseline Area of Impairment: Attention, Memory, Following commands, Safety/judgement, Awareness, Problem solving                   Current Attention Level: Selective Memory: Decreased short-term memory Following Commands: Follows one step commands consistently, Follows multi-step commands inconsistently, Follows multi-step commands with increased time   Awareness: Emergent Problem Solving: Requires verbal cues General Comments: Improved cognition as compared to previous sessions with pt becoming increasingly self aware of deficits; Impairments continue to be noted however with fair frustration tolerance and decreased safe ADL mobility/completion noted with dual tasking requring increased vcs for safety        Exercises      Shoulder Instructions       General Comments      Pertinent Vitals/ Pain       Pain Assessment Pain Assessment: 0-10 Pain Score: 2  Pain Location: R calf Pain Descriptors / Indicators: Aching Pain Intervention(s): Monitored during session  Home Living                                          Prior Functioning/Environment              Frequency  Min 1X/week        Progress Toward Goals  OT Goals(current goals can now be found in the care plan section)  Progress towards OT goals: Progressing toward goals     Plan      Co-evaluation                 AM-PAC OT "6 Clicks" Daily Activity     Outcome Measure   Help from another person eating meals?: None Help from another person taking care of personal grooming?: None Help from another person toileting, which includes using toliet, bedpan, or urinal?: A Little Help from another person bathing (including washing, rinsing, drying)?: A Little Help from another person to put  on and taking off regular upper body clothing?: None Help from another person to put on and taking off regular lower body clothing?: A Little 6 Click Score: 21    End of Session    OT Visit Diagnosis: Other abnormalities of gait and mobility (R26.89);Other symptoms and signs involving cognitive function   Activity Tolerance Patient tolerated treatment well   Patient Left in bed;with call bell/phone within reach   Nurse Communication Mobility status        Time: 2952-8413 OT Time Calculation (min): 29 min  Charges: OT General Charges $OT Visit: 1 Visit OT Treatments $Therapeutic Activity: 23-37 mins Oleta Mouse, OTD OTR/L  01/30/23, 1:37 PM
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
## 2023-01-30 NOTE — Progress Notes (Incomplete)
Date of Admission:  01/17/2023     ID: Samuel Riley is a 52 y.o. male Principal Problem:   Subdural hematoma (HCC) Active Problems:   Alcohol abuse   Right sided weakness   Traumatic brain compression with herniation, initial encounter (HCC)   Alcoholic intoxication without complication (HCC)   Alcohol use disorder, severe, dependence (HCC)   Pressure injury of skin   Bacteremia due to methicillin susceptible Staphylococcus aureus (MSSA)    Subjective: Pt has rt calf pain  when walking  Medications:   vitamin C  500 mg Oral BID   divalproex  500 mg Oral QHS   divalproex  750 mg Oral Daily   enoxaparin (LOVENOX) injection  40 mg Subcutaneous QHS   feeding supplement  237 mL Oral BID BM   folic acid  1 mg Oral Daily   iron polysaccharides  150 mg Oral Daily   metoprolol tartrate  12.5 mg Oral BID   multivitamin with minerals  1 tablet Oral Daily   pantoprazole  40 mg Oral Daily   thiamine  100 mg Oral Daily   Vitamin D (Ergocalciferol)  50,000 Units Oral Q7 days    Objective: Vital signs in last 24 hours: Patient Vitals for the past 24 hrs:  BP Temp Temp src Pulse Resp SpO2 Weight  01/30/23 1204 114/81 97.9 F (36.6 C) -- 86 -- 100 % --  01/30/23 0803 (!) 130/90 97.8 F (36.6 C) -- 90 -- 100 % --  01/30/23 0523 123/84 97.8 F (36.6 C) Oral 87 18 100 % --  01/30/23 0500 -- -- -- -- -- -- 68 kg  01/30/23 0022 116/78 97.9 F (36.6 C) Oral 78 20 95 % --  01/29/23 2121 129/82 -- -- -- -- -- --  01/29/23 1924 128/85 99 F (37.2 C) Oral (!) 101 18 100 % --  01/29/23 1730 -- -- -- -- 13 -- --  01/29/23 1500 124/79 98.1 F (36.7 C) Oral 98 18 98 % --      PHYSICAL EXAM:  General: Alert, cooperative, no distress, appears stated age.  Head: surgical site left hemicranium clean , no discharge , no erythema Eyes: Conjunctivae clear, anicteric sclerae. Pupils are equal ENT Nares normal. No drainage or sinus tenderness. Lips, mucosa, and tongue normal. No  Thrush Neck: Supple, symmetrical, no adenopathy, thyroid: non tender no carotid bruit and no JVD. Back: No CVA tenderness. Lungs: Clear to auscultation bilaterally. No Wheezing or Rhonchi. No rales. Heart: Regular rate and rhythm, no murmur, rub or gallop. Abdomen: Soft, non-tender,not distended. Bowel sounds normal. No masses Extremities: rt leg- calf area swollen, erythematous skin, tender  Skin: No rashes or lesions. Or bruising Scab on his rt foot- no erytthema Lymph: Cervical, supraclavicular normal. Neurologic: Grossly non-focal  Lab Results    Latest Ref Rng & Units 01/30/2023    5:54 AM 01/29/2023    5:41 AM 01/28/2023    4:19 AM  CBC  WBC 4.0 - 10.5 K/uL 19.2  16.6  22.9   Hemoglobin 13.0 - 17.0 g/dL 9.1  9.1  9.6   Hematocrit 39.0 - 52.0 % 25.9  25.5  26.6   Platelets 150 - 400 K/uL 1,013  1,033  1,015        Latest Ref Rng & Units 01/30/2023    5:54 AM 01/29/2023    5:41 AM 01/28/2023    4:19 AM  CMP  Glucose 70 - 99 mg/dL 956  213  99   BUN 6 -  20 mg/dL <5  <5  <5   Creatinine 0.61 - 1.24 mg/dL 4.78  2.95  6.21   Sodium 135 - 145 mmol/L 132  129  129   Potassium 3.5 - 5.1 mmol/L 3.5  2.9  3.3   Chloride 98 - 111 mmol/L 94  89  89   CO2 22 - 32 mmol/L 29  32  30   Calcium 8.9 - 10.3 mg/dL 7.6  7.6  7.7       Microbiology: [facility] 01/26/23 MSSa 01/28/23- Bc Studies/Results: US Venous Img Lower Bilateral (DVT)  Result Date: 01/29/2023 CLINICAL DATA:  Bilateral lower extremity edema and right calf erythema. EXAM: BILATERAL LOWER EXTREMITY VENOUS DOPPLER ULTRASOUND TECHNIQUE: Gray-scale sonography with graded compression, as well as color Doppler and duplex ultrasound were performed to evaluate the lower extremity deep venous systems from the level of the common femoral vein and including the common femoral, femoral, profunda femoral, popliteal and calf veins including the posterior tibial, peroneal and gastrocnemius veins when visible. The superficial great saphenous vein  was also interrogated. Spectral Doppler was utilized to evaluate flow at rest and with distal augmentation maneuvers in the common femoral, femoral and popliteal veins. COMPARISON:  None Available. FINDINGS: RIGHT LOWER EXTREMITY Common Femoral Vein: No evidence of thrombus. Normal compressibility, respiratory phasicity and response to augmentation. Saphenofemoral Junction: No evidence of thrombus. Normal compressibility and flow on color Doppler imaging. Profunda Femoral Vein: No evidence of thrombus. Normal compressibility and flow on color Doppler imaging. Femoral Vein: No evidence of thrombus. Normal compressibility, respiratory phasicity and response to augmentation. Popliteal Vein: No evidence of thrombus. Normal compressibility, respiratory phasicity and response to augmentation. Calf Veins: No evidence of thrombus. Normal compressibility and flow on color Doppler imaging. Superficial Great Saphenous Vein: No evidence of thrombus. Normal compressibility. Venous Reflux:  None. Other Findings: No evidence of superficial thrombophlebitis or abnormal fluid collection. LEFT LOWER EXTREMITY Common Femoral Vein: No evidence of thrombus. Normal compressibility, respiratory phasicity and response to augmentation. Saphenofemoral Junction: No evidence of thrombus. Normal compressibility and flow on color Doppler imaging. Profunda Femoral Vein: No evidence of thrombus. Normal compressibility and flow on color Doppler imaging. Femoral Vein: No evidence of thrombus. Normal compressibility, respiratory phasicity and response to augmentation. Popliteal Vein: No evidence of thrombus. Normal compressibility, respiratory phasicity and response to augmentation. Calf Veins: No evidence of thrombus. Normal compressibility and flow on color Doppler imaging. Superficial Great Saphenous Vein: No evidence of thrombus. Normal compressibility. Venous Reflux:  None. Other Findings: No evidence of superficial thrombophlebitis or abnormal  fluid collection. IMPRESSION: No evidence of deep venous thrombosis in either lower extremity. Electronically Signed   By: Irish Lack M.D.   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

## 2023-01-31 ENCOUNTER — Inpatient Hospital Stay: Payer: BC Managed Care – PPO

## 2023-01-31 ENCOUNTER — Encounter: Payer: Self-pay | Admitting: Neurosurgery

## 2023-01-31 DIAGNOSIS — L02415 Cutaneous abscess of right lower limb: Secondary | ICD-10-CM

## 2023-01-31 DIAGNOSIS — B9561 Methicillin susceptible Staphylococcus aureus infection as the cause of diseases classified elsewhere: Secondary | ICD-10-CM | POA: Diagnosis not present

## 2023-01-31 DIAGNOSIS — R7881 Bacteremia: Secondary | ICD-10-CM | POA: Diagnosis not present

## 2023-01-31 DIAGNOSIS — S065XAA Traumatic subdural hemorrhage with loss of consciousness status unknown, initial encounter: Secondary | ICD-10-CM | POA: Diagnosis not present

## 2023-01-31 LAB — ANAEROBIC CULTURE W GRAM STAIN: Gram Stain: NONE SEEN

## 2023-01-31 LAB — CBC
HCT: 28.1 % — ABNORMAL LOW (ref 39.0–52.0)
Hemoglobin: 9.8 g/dL — ABNORMAL LOW (ref 13.0–17.0)
MCH: 33.9 pg (ref 26.0–34.0)
MCHC: 34.9 g/dL (ref 30.0–36.0)
MCV: 97.2 fL (ref 80.0–100.0)
Platelets: 1074 10*3/uL (ref 150–400)
RBC: 2.89 MIL/uL — ABNORMAL LOW (ref 4.22–5.81)
RDW: 15 % (ref 11.5–15.5)
WBC: 27.6 10*3/uL — ABNORMAL HIGH (ref 4.0–10.5)
nRBC: 0.4 % — ABNORMAL HIGH (ref 0.0–0.2)

## 2023-01-31 LAB — BASIC METABOLIC PANEL
Anion gap: 10 (ref 5–15)
BUN: <5 mg/dL — ABNORMAL LOW (ref 6–20)
CO2: 28 mmol/L (ref 22–32)
Calcium: 7.9 mg/dL — ABNORMAL LOW (ref 8.9–10.3)
Chloride: 94 mmol/L — ABNORMAL LOW (ref 98–111)
Creatinine, Ser: 0.66 mg/dL (ref 0.61–1.24)
GFR, Estimated: >60 mL/min (ref >60)
Glucose, Bld: 106 mg/dL — ABNORMAL HIGH (ref 70–99)
Potassium: 3.6 mmol/L (ref 3.5–5.1)
Sodium: 132 mmol/L — ABNORMAL LOW (ref 135–145)

## 2023-01-31 LAB — MAGNESIUM: Magnesium: 2.3 mg/dL (ref 1.7–2.4)

## 2023-01-31 MED ORDER — GADOBUTROL 1 MMOL/ML IV SOLN
7.0000 mL | Freq: Once | INTRAVENOUS | Status: AC | PRN
  Administered 2023-01-31: 7 mL via INTRAVENOUS

## 2023-01-31 NOTE — Progress Notes (Signed)
Inpatient Rehab Admissions Coordinator:   I did receive insurance approval.  Note WBC trending back up, today is 27.6.  Discussed with Dr. Lucianne Muss who is going to discuss with ID.  Need an assessment/evaluation of WBC and plan for management/treatment.  I updated pt's son, Terese Door, over the phone and we discussed daily monitoring of medical readiness and therapy needs.    Estill Dooms, PT, DPT Admissions Coordinator [phone] 01/31/23  10:23 AM

## 2023-01-31 NOTE — Consult Note (Signed)
Date of Consultation:  01/31/2023  Requesting Physician:  Gillis Santa, MD  Reason for Consultation:  Right lower extremity abscess  History of Present Illness: Samuel Riley is a 52 y.o. male admitted on 01/17/23 with EtOH intoxication and a fall, resulting in a large left subdural hematoma, which required emergent left frontoparietal craniotomy.  He has been having right lower extremity pain and a worsening WBC.  He was also bacteremic with Staph aureus, and he's currently on Nafcillin.  He had a lower extremity ultrasound on 01/29/23 which did not show any DVT, and he had a CT scan of his right leg which was concerning for a possible intramuscular abscess, although the area of concern seemed tubular/branching in appearance and also coursed along the arterial blood flow going to the medial gastrocnemius muscle.  Past Medical History: Past Medical History:  Diagnosis Date   Alcohol abuse    Anxiety    Atypical mole 02/10/2015   Right supra auricular scalp. Mild atypia, deep margin involved.    Basal cell carcinoma 03/14/2006   Mid vertex scalp.    Basal cell carcinoma 03/26/2007   Vertex scalp.    Basal cell carcinoma 01/11/2010   Scalp.    Basal cell carcinoma 11/05/2013   Left anterior deltoid. Superficial. EDC.   Basal cell carcinoma 10/20/2014   Left superior pectoral. Nodular.   Basal cell carcinoma 10/30/2022   Right vertex scalp. Nodular, infiltrative pattern. Mohs pending   Dyslipidemia    Dysplastic nevus 03/06/2017   Right proximal ant. lat. thigh. Mild atypia, limited margins free.   Dysplastic nevus 03/06/2017   Right mid ant. thigh. Mild atypia, limited margins free.   Dysplastic nevus 03/24/2019   Left low back paraspinal. Moderate atypia, close to margin.   Monocytosis      Past Surgical History: Past Surgical History:  Procedure Laterality Date   COLONOSCOPY WITH PROPOFOL N/A 03/17/2021   Procedure: COLONOSCOPY WITH PROPOFOL;  Surgeon: Wyline Mood, MD;   Location: [facility] ENDOSCOPY;  Service: Gastroenterology;  Laterality: N/A;   CRANIOTOMY Left 01/17/2023   Procedure: CRANIOTOMY HEMATOMA EVACUATION SUBDURAL;  Surgeon: Lovenia Kim, MD;  Location: [facility] ORS;  Service: Neurosurgery;  Laterality: Left;   SPLENECTOMY, TOTAL N/A 2000   [facility]    Home Medications: Prior to Admission medications   Medication Sig Start Date End Date Taking? Authorizing Provider  amLODipine (NORVASC) 10 MG tablet TAKE 1 TABLET BY MOUTH EVERY DAY Patient not taking: Reported on 10/30/2022 10/28/21   Eulis Foster, FNP  Calcipotriene-Betameth Diprop (ENSTILAR) 0.005-0.064 % FOAM Apply to affected areas qd-bid prn, Avoid applying to face, groin, and axilla. Use as directed. Long-term use can cause thinning of the skin. 10/30/22   Elie Goody, MD  celecoxib (CELEBREX) 200 MG capsule TAKE 1 CAPSULE (200 MG TOTAL) BY MOUTH DAILY AFTER BREAKFAST. 01/21/23   Sherlene Shams, MD  cloNIDine (CATAPRES) 0.1 MG tablet TAKE 1 TABLET (0.1 MG TOTAL) BY MOUTH AT BEDTIME AS NEEDED. 10/01/22   Sherlene Shams, MD  divalproex (DEPAKOTE) 250 MG DR tablet 3 tablets in the morning,  2 tablets in the evening 05/23/22   Sherlene Shams, MD  finasteride (PROPECIA) 1 MG tablet Take 1 mg by mouth daily. 03/14/21   [provider]  omeprazole (PRILOSEC) 20 MG capsule Take 20 mg by mouth daily.    [provider]    Allergies: No Known Allergies  Social History:  reports that he has been smoking cigarettes. He has a 15 pack-year  smoking history. He has never used smokeless tobacco. He reports current alcohol use. He reports that he does not currently use drugs after having used the following drugs: Cocaine.   Family History: Family History  Problem Relation Age of Onset   Hypertension Father    Prostate cancer Father    Hypertension Brother     Review of Systems: Review of Systems  Constitutional:  Negative for chills and fever.  Respiratory:  Negative for shortness of  breath.   Cardiovascular:  Negative for chest pain.  Gastrointestinal:  Negative for nausea and vomiting.  Musculoskeletal:        Right lower extremity pain  Skin:        No erythema    Physical Exam BP 130/86 (BP Location: Right Arm)   Pulse 88   Temp 98.7 F (37.1 C)   Resp 18   Ht 5\' 8"  (1.727 m)   Wt 70.2 kg   SpO2 99%   BMI 23.53 kg/m  CONSTITUTIONAL: No acute distress, well nourished. HEENT:  Normocephalic, atraumatic, extraocular motion intact. RESPIRATORY:  Normal respiratory effort without pathologic use of accessory muscles. CARDIOVASCULAR: Regular rhythm and rate. MUSCULOSKELETAL:  Right lower extremity has an area of firmness measuring about 4 cm size in the proximal calf, but without any induration or erythema.  No other areas of pain.  Has palpable PT/DP and popliteal pulses.  No motor/sensory changes. SKIN: Skin turgor is normal. There are no pathologic skin lesions.  NEUROLOGIC:  Motor and sensation is grossly normal.  Cranial nerves are grossly intact. PSYCH:  Alert and oriented to person, place and time. Affect is normal.  Laboratory Analysis: Results for orders placed or performed during the hospital encounter of 01/17/23 (from the past 24 hour(s))  Magnesium     Status: None   Collection Time: 01/31/23  5:08 AM  Result Value Ref Range   Magnesium 2.3 1.7 - 2.4 mg/dL  Basic metabolic panel     Status: Abnormal   Collection Time: 01/31/23  5:08 AM  Result Value Ref Range   Sodium 132 (L) 135 - 145 mmol/L   Potassium 3.6 3.5 - 5.1 mmol/L   Chloride 94 (L) 98 - 111 mmol/L   CO2 28 22 - 32 mmol/L   Glucose, Bld 106 (H) 70 - 99 mg/dL   BUN <5 (L) 6 - 20 mg/dL   Creatinine, Ser 2.95 0.61 - 1.24 mg/dL   Calcium 7.9 (L) 8.9 - 10.3 mg/dL   GFR, Estimated >62 >13 mL/min   Anion gap 10 5 - 15  CBC     Status: Abnormal   Collection Time: 01/31/23  5:08 AM  Result Value Ref Range   WBC 27.6 (H) 4.0 - 10.5 K/uL   RBC 2.89 (L) 4.22 - 5.81 MIL/uL   Hemoglobin  9.8 (L) 13.0 - 17.0 g/dL   HCT 08.6 (L) 57.8 - 46.9 %   MCV 97.2 80.0 - 100.0 fL   MCH 33.9 26.0 - 34.0 pg   MCHC 34.9 30.0 - 36.0 g/dL   RDW 62.9 52.8 - 41.3 %   Platelets 1,074 (HH) 150 - 400 K/uL   nRBC 0.4 (H) 0.0 - 0.2 %    Imaging: CT TIBIA FIBULA RIGHT W CONTRAST  Result Date: 01/31/2023 CLINICAL DATA:  Soft tissue infection, concern for abscess EXAM: CT OF THE LOWER RIGHT EXTREMITY WITH CONTRAST TECHNIQUE: Multidetector CT imaging of the lower right extremity was performed according to the standard protocol following intravenous contrast administration. RADIATION  DOSE REDUCTION: This exam was performed according to the departmental dose-optimization program which includes automated exposure control, adjustment of the mA and/or kV according to patient size and/or use of iterative reconstruction technique. CONTRAST:  OMNIPAQUE IOHEXOL 300 MG/ML  SOLN COMPARISON:  None Available. FINDINGS: Bones/Joint/Cartilage No bony destructive findings characteristic of osteomyelitis. Tricompartmental osteoarthritis of the knee. Multiple small ossific fragments along the posterior margin of the knee joint suspicious for possible free fragments. Additional small free fragments believed to be present in a small Baker's cyst. Small knee joint effusion. 0.5 by 0.9 cm hypodensity compatible with non-fragmented osteochondral lesion along the medial talar dome, image 39 series 7. Ligaments Suboptimally assessed by CT. Muscles and Tendons Branching tubular hypodensities with enhancing margins in the medial head left gastrocnemius muscle measuring up to about 2.0 by 1.3 by 10.0 cm (volume = 14 cm^3), as shown for example on image 76 of series 5. Possibility of some of this appearance representing expanded and thrombosed intramuscular venous structures as raised given the tubular configuration. Substantial expansion and edema throughout the medial head gastrocnemius with abnormal edema tracking within and along the  margins of the medial head. Edema tracks along the adjacent fascia plane with the soleus muscle and within the superficial posterior compartment. Soft tissues Infiltrative edema diffusely along the popliteal space subcutaneous edema in the calf, favoring the posteromedial calf, with edema tracking along the superficial fascia margin of the superficial posterior compartment. No gas in the soft tissues. IMPRESSION: 1. Branching tubular hypodensities with enhancing margins in the medial head gastrocnemius muscle measuring up to about 2.0 by 1.3 by 10.0 cm (volume = 14 cm^ 3). Possibility of some of this appearance representing expanded and thrombosed intramuscular venous structures as raised given the tubular configuration. Substantial expansion and edema throughout the medial head gastrocnemius with abnormal edema tracking within and along the margins of the medial head. Edema tracks along the adjacent fascia plane with the soleus muscle and within the superficial posterior compartment. Findings are compatible with myositis and intramuscular abscess. Other vessels are patent in the posterior compartment without definitive imaging evidence of compartment syndrome, although careful clinical correlation is suggested. 2. Infiltrative edema diffusely along the popliteal space and subcutaneous edema in the calf, favoring the posteromedial calf, with edema tracking along the superficial fascia margin of the superficial posterior compartment. 3. No bony destructive findings characteristic of osteomyelitis. 4. Tricompartmental osteoarthritis of the knee. Multiple small ossific fragments along the posterior margin of the knee joint suspicious for free osteochondral fragments. Additional small free fragments believed to be present in a small Baker's cyst. 5. Small knee joint effusion. 6. 0.5 by 0.9 cm non-fragmented osteochondral lesion along the medial talar dome. Electronically Signed   By: Gaylyn Rong M.D.   On:  01/31/2023 09:34    Assessment and Plan: This is a 52 y.o. male with a possible right lower extremity abscess  --Discussed with the patient the findings on his ultrasound and CT scan.  It is unusual to have an abscess that appears tubular and with branching points, particularly in that this also courses along the arterial flow.  This could possibly be thrombosed veins with resulting infection, but his recent ultrasound did not show any DVT.  There is no gas within this area to suggest gangrene or necrotizing infection. --Discussed with Dr. Gilda Crease as well with vascular surgery and with Dr. Lucianne Muss.  MRI of right lower leg has been ordered to determine further.  Discussed with the patient that he  may need surgery with either team depending on findings, but will keep him updated on the results of the MRI. --No concerns on exam for acute ischemia or compartment syndrome.  His pain is very localized and would also have low suspicion for necrotizing infection.   I spent 45 minutes dedicated to the care of this patient on the date of this encounter to include pre-visit review of records, face-to-face time with the patient discussing diagnosis and management, and any post-visit coordination of care.   Howie Ill, MD [facility]:  [phone]

## 2023-01-31 NOTE — Progress Notes (Signed)
Date of Admission:  01/17/2023     ID: Samuel Riley is a 52 y.o. male Principal Problem:   Subdural hematoma (HCC) Active Problems:   Alcohol abuse   Right sided weakness   Traumatic brain compression with herniation, initial encounter (HCC)   Alcoholic intoxication without complication (HCC)   Alcohol use disorder, severe, dependence (HCC)   Pressure injury of skin   Bacteremia due to methicillin susceptible Staphylococcus aureus (MSSA)   Abscess of right lower extremity    Subjective: Pt has rt calf pain  when walking  Medications:   vitamin C  500 mg Oral BID   divalproex  500 mg Oral QHS   divalproex  750 mg Oral Daily   enoxaparin (LOVENOX) injection  40 mg Subcutaneous QHS   feeding supplement  237 mL Oral BID BM   folic acid  1 mg Oral Daily   iron polysaccharides  150 mg Oral Daily   methocarbamol  500 mg Oral TID   metoprolol tartrate  12.5 mg Oral BID   multivitamin with minerals  1 tablet Oral Daily   pantoprazole  40 mg Oral Daily   thiamine  100 mg Oral Daily   Vitamin D (Ergocalciferol)  50,000 Units Oral Q7 days    Objective: Vital signs in last 24 hours: Patient Vitals for the past 24 hrs:  BP Temp Temp src Pulse Resp SpO2 Weight  01/31/23 1158 130/86 98.7 F (37.1 C) -- 88 18 99 % --  01/31/23 0831 129/89 98.6 F (37 C) -- 91 18 100 % --  01/31/23 0359 -- -- -- -- -- -- 70.2 kg  01/31/23 0356 121/81 98.3 F (36.8 C) Axillary 91 18 97 % --  01/30/23 2323 118/78 99.9 F (37.7 C) Oral 93 18 99 % --  01/30/23 2138 120/75 -- -- 96 -- -- --  01/30/23 1947 128/84 99.3 F (37.4 C) Oral 91 18 100 % --  01/30/23 1721 127/85 98.3 F (36.8 C) -- 94 -- 100 % --      PHYSICAL EXAM:  General: Alert, cooperative, no distress, appears stated age.  Head: surgical site left hemicranium clean , no discharge , no erythema Eyes: Conjunctivae clear, anicteric sclerae. Pupils are equal ENT Nares normal. No drainage or sinus tenderness. Lips, mucosa, and  tongue normal. No Thrush Neck: Supple, symmetrical, no adenopathy, thyroid: non tender no carotid bruit and no JVD. Back: No CVA tenderness. Lungs: Clear to auscultation bilaterally. No Wheezing or Rhonchi. No rales. Heart: Regular rate and rhythm, no murmur, rub or gallop. Abdomen: Soft, non-tender,not distended. Bowel sounds normal. No masses Extremities: rt leg- calf area swollen, firm tender area  Skin: No rashes or lesions. Or bruising Scab on his rt foot- no erytthema Lymph: Cervical, supraclavicular normal. Neurologic: Grossly non-focal  Lab Results    Latest Ref Rng & Units 01/31/2023    5:08 AM 01/30/2023    5:54 AM 01/29/2023    5:41 AM  CBC  WBC 4.0 - 10.5 K/uL 27.6  19.2  16.6   Hemoglobin 13.0 - 17.0 g/dL 9.8  9.1  9.1   Hematocrit 39.0 - 52.0 % 28.1  25.9  25.5   Platelets 150 - 400 K/uL 1,074  1,013  1,033        Latest Ref Rng & Units 01/31/2023    5:08 AM 01/30/2023    5:54 AM 01/29/2023    5:41 AM  CMP  Glucose 70 - 99 mg/dL 161  096  045  BUN 6 - 20 mg/dL <5  <5  <5   Creatinine 0.61 - 1.24 mg/dL 8.29  5.62  1.30   Sodium 135 - 145 mmol/L 132  132  129   Potassium 3.5 - 5.1 mmol/L 3.6  3.5  2.9   Chloride 98 - 111 mmol/L 94  94  89   CO2 22 - 32 mmol/L 28  29  32   Calcium 8.9 - 10.3 mg/dL 7.9  7.6  7.6       Microbiology: [facility] 01/26/23 MSSa 01/28/23- Bc Studies/Results:  CT TIBIA FIBULA RIGHT W CONTRAST  Result Date: 01/31/2023 CLINICAL DATA:  Soft tissue infection, concern for abscess EXAM: CT OF THE LOWER RIGHT EXTREMITY WITH CONTRAST TECHNIQUE: Multidetector CT imaging of the lower right extremity was performed according to the standard protocol following intravenous contrast administration. RADIATION DOSE REDUCTION: This exam was performed according to the departmental dose-optimization program which includes automated exposure control, adjustment of the mA and/or kV according to patient size and/or use of iterative reconstruction technique. CONTRAST:   OMNIPAQUE IOHEXOL 300 MG/ML  SOLN COMPARISON:  None Available. FINDINGS: Bones/Joint/Cartilage No bony destructive findings characteristic of osteomyelitis. Tricompartmental osteoarthritis of the knee. Multiple small ossific fragments along the posterior margin of the knee joint suspicious for possible free fragments. Additional small free fragments believed to be present in a small Baker's cyst. Small knee joint effusion. 0.5 by 0.9 cm hypodensity compatible with non-fragmented osteochondral lesion along the medial talar dome, image 39 series 7. Ligaments Suboptimally assessed by CT. Muscles and Tendons Branching tubular hypodensities with enhancing margins in the medial head left gastrocnemius muscle measuring up to about 2.0 by 1.3 by 10.0 cm (volume = 14 cm^3), as shown for example on image 76 of series 5. Possibility of some of this appearance representing expanded and thrombosed intramuscular venous structures as raised given the tubular configuration. Substantial expansion and edema throughout the medial head gastrocnemius with abnormal edema tracking within and along the margins of the medial head. Edema tracks along the adjacent fascia plane with the soleus muscle and within the superficial posterior compartment. Soft tissues Infiltrative edema diffusely along the popliteal space subcutaneous edema in the calf, favoring the posteromedial calf, with edema tracking along the superficial fascia margin of the superficial posterior compartment. No gas in the soft tissues. IMPRESSION: 1. Branching tubular hypodensities with enhancing margins in the medial head gastrocnemius muscle measuring up to about 2.0 by 1.3 by 10.0 cm (volume = 14 cm^ 3). Possibility of some of this appearance representing expanded and thrombosed intramuscular venous structures as raised given the tubular configuration. Substantial expansion and edema throughout the medial head gastrocnemius with abnormal edema tracking within and  along the margins of the medial head. Edema tracks along the adjacent fascia plane with the soleus muscle and within the superficial posterior compartment. Findings are compatible with myositis and intramuscular abscess. Other vessels are patent in the posterior compartment without definitive imaging evidence of compartment syndrome, although careful clinical correlation is suggested. 2. Infiltrative edema diffusely along the popliteal space and subcutaneous edema in the calf, favoring the posteromedial calf, with edema tracking along the superficial fascia margin of the superficial posterior compartment. 3. No bony destructive findings characteristic of osteomyelitis. 4. Tricompartmental osteoarthritis of the knee. Multiple small ossific fragments along the posterior margin of the knee joint suspicious for free osteochondral fragments. Additional small free fragments believed to be present in a small Baker's cyst. 5. Small knee joint effusion. 6. 0.5 by 0.9  cm non-fragmented osteochondral lesion along the medial talar dome. Electronically Signed   By: Gaylyn Rong M.D.   On: 01/31/2023 09:34   US Venous Img Lower Bilateral (DVT)  Result Date: 01/29/2023 CLINICAL DATA:  Bilateral lower extremity edema and right calf erythema. EXAM: BILATERAL LOWER EXTREMITY VENOUS DOPPLER ULTRASOUND TECHNIQUE: Gray-scale sonography with graded compression, as well as color Doppler and duplex ultrasound were performed to evaluate the lower extremity deep venous systems from the level of the common femoral vein and including the common femoral, femoral, profunda femoral, popliteal and calf veins including the posterior tibial, peroneal and gastrocnemius veins when visible. The superficial great saphenous vein was also interrogated. Spectral Doppler was utilized to evaluate flow at rest and with distal augmentation maneuvers in the common femoral, femoral and popliteal veins. COMPARISON:  None Available. FINDINGS: RIGHT LOWER  EXTREMITY Common Femoral Vein: No evidence of thrombus. Normal compressibility, respiratory phasicity and response to augmentation. Saphenofemoral Junction: No evidence of thrombus. Normal compressibility and flow on color Doppler imaging. Profunda Femoral Vein: No evidence of thrombus. Normal compressibility and flow on color Doppler imaging. Femoral Vein: No evidence of thrombus. Normal compressibility, respiratory phasicity and response to augmentation. Popliteal Vein: No evidence of thrombus. Normal compressibility, respiratory phasicity and response to augmentation. Calf Veins: No evidence of thrombus. Normal compressibility and flow on color Doppler imaging. Superficial Great Saphenous Vein: No evidence of thrombus. Normal compressibility. Venous Reflux:  None. Other Findings: No evidence of superficial thrombophlebitis or abnormal fluid collection. LEFT LOWER EXTREMITY Common Femoral Vein: No evidence of thrombus. Normal compressibility, respiratory phasicity and response to augmentation. Saphenofemoral Junction: No evidence of thrombus. Normal compressibility and flow on color Doppler imaging. Profunda Femoral Vein: No evidence of thrombus. Normal compressibility and flow on color Doppler imaging. Femoral Vein: No evidence of thrombus. Normal compressibility, respiratory phasicity and response to augmentation. Popliteal Vein: No evidence of thrombus. Normal compressibility, respiratory phasicity and response to augmentation. Calf Veins: No evidence of thrombus. Normal compressibility and flow on color Doppler imaging. Superficial Great Saphenous Vein: No evidence of thrombus. Normal compressibility. Venous Reflux:  None. Other Findings: No evidence of superficial thrombophlebitis or abnormal fluid collection. IMPRESSION: No evidence of deep venous thrombosis in either lower extremity. Electronically Signed   By: Irish Lack M.D.   On: 01/29/2023 16:55     Assessment/Plan: LEft subdural hematoma  following fall Encephalopathy due to the above- rt midline shift- s/p evacuation on 01/17/23 ETOH intoxication   MSSA bacteremia with sepsis- pt has tender swelling rt calf- need to r/o abscess-  Will need CT scan Pt on nafcillin Repeat blood culture Neg  2 d echo no valvular vegetation Rt calf seotic phelbitis gastrocnemius muscle ? 

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
## 2023-02-01 ENCOUNTER — Encounter: Payer: Self-pay | Admitting: Pulmonary Disease

## 2023-02-01 DIAGNOSIS — B9561 Methicillin susceptible Staphylococcus aureus infection as the cause of diseases classified elsewhere: Secondary | ICD-10-CM | POA: Diagnosis not present

## 2023-02-01 DIAGNOSIS — R7881 Bacteremia: Secondary | ICD-10-CM | POA: Diagnosis not present

## 2023-02-01 DIAGNOSIS — L02415 Cutaneous abscess of right lower limb: Secondary | ICD-10-CM | POA: Diagnosis not present

## 2023-02-01 DIAGNOSIS — S065XAA Traumatic subdural hemorrhage with loss of consciousness status unknown, initial encounter: Secondary | ICD-10-CM | POA: Diagnosis not present

## 2023-02-01 LAB — CBC
HCT: 27.2 % — ABNORMAL LOW (ref 39.0–52.0)
Hemoglobin: 9.2 g/dL — ABNORMAL LOW (ref 13.0–17.0)
MCH: 34.5 pg — ABNORMAL HIGH (ref 26.0–34.0)
MCHC: 33.8 g/dL (ref 30.0–36.0)
MCV: 101.9 fL — ABNORMAL HIGH (ref 80.0–100.0)
Platelets: 1054 10*3/uL (ref 150–400)
RBC: 2.67 MIL/uL — ABNORMAL LOW (ref 4.22–5.81)
RDW: 16 % — ABNORMAL HIGH (ref 11.5–15.5)
WBC: 30.8 10*3/uL — ABNORMAL HIGH (ref 4.0–10.5)
nRBC: 0.5 % — ABNORMAL HIGH (ref 0.0–0.2)

## 2023-02-01 LAB — BASIC METABOLIC PANEL
Anion gap: 9 (ref 5–15)
BUN: <5 mg/dL — ABNORMAL LOW (ref 6–20)
CO2: 28 mmol/L (ref 22–32)
Calcium: 7.7 mg/dL — ABNORMAL LOW (ref 8.9–10.3)
Chloride: 94 mmol/L — ABNORMAL LOW (ref 98–111)
Creatinine, Ser: 0.68 mg/dL (ref 0.61–1.24)
GFR, Estimated: >60 mL/min (ref >60)
Glucose, Bld: 117 mg/dL — ABNORMAL HIGH (ref 70–99)
Potassium: 3.5 mmol/L (ref 3.5–5.1)
Sodium: 131 mmol/L — ABNORMAL LOW (ref 135–145)

## 2023-02-01 LAB — PROCALCITONIN: Procalcitonin: 0.11 ng/mL

## 2023-02-01 MED ORDER — HYALURONIDASE HUMAN 150 UNIT/ML IJ SOLN
150.0000 [IU] | Freq: Once | INTRAMUSCULAR | Status: AC
  Administered 2023-02-01: 150 [IU] via SUBCUTANEOUS
  Filled 2023-02-01: qty 1

## 2023-02-01 NOTE — Consult Note (Signed)
Hospital Consult    Reason for Consult:  Right Lower extremity Phlebitis  Requesting Physician:  Dr Henrene Dodge MD  MRN #:  [id number]  History of Present Illness: This is a 52 y.o. male admitted on 01/17/23 with EtOH intoxication and a fall, resulting in a large left subdural hematoma, which required emergent left frontoparietal craniotomy. He has been having right lower extremity pain and a worsening WBC. He was also bacteremic with Staph aureus, and he's currently on Nafcillin. He had a lower extremity ultrasound on 01/29/23 which did not show any DVT, and he had a CT scan of his right leg which was concerning for a possible intramuscular abscess, although the area of concern seemed tubular/branching in appearance and also coursed along the arterial blood flow going to the medial gastrocnemius muscle. Vascular Surgery consulted to evaluate.   Past Medical History:  Diagnosis Date   Alcohol abuse    Anxiety    Atypical mole 02/10/2015   Right supra auricular scalp. Mild atypia, deep margin involved.    Basal cell carcinoma 03/14/2006   Mid vertex scalp.    Basal cell carcinoma 03/26/2007   Vertex scalp.    Basal cell carcinoma 01/11/2010   Scalp.    Basal cell carcinoma 11/05/2013   Left anterior deltoid. Superficial. EDC.   Basal cell carcinoma 10/20/2014   Left superior pectoral. Nodular.   Basal cell carcinoma 10/30/2022   Right vertex scalp. Nodular, infiltrative pattern. Mohs pending   Dyslipidemia    Dysplastic nevus 03/06/2017   Right proximal ant. lat. thigh. Mild atypia, limited margins free.   Dysplastic nevus 03/06/2017   Right mid ant. thigh. Mild atypia, limited margins free.   Dysplastic nevus 03/24/2019   Left low back paraspinal. Moderate atypia, close to margin.   Monocytosis     Past Surgical History:  Procedure Laterality Date   COLONOSCOPY WITH PROPOFOL N/A 03/17/2021   Procedure: COLONOSCOPY WITH PROPOFOL;  Surgeon: Wyline Mood, MD;  Location: [facility]  ENDOSCOPY;  Service: Gastroenterology;  Laterality: N/A;   CRANIOTOMY Left 01/17/2023   Procedure: CRANIOTOMY HEMATOMA EVACUATION SUBDURAL;  Surgeon: Lovenia Kim, MD;  Location: [facility] ORS;  Service: Neurosurgery;  Laterality: Left;   SPLENECTOMY, TOTAL N/A 2000   [facility]    No Known Allergies  Prior to Admission medications   Medication Sig Start Date End Date Taking? Authorizing Provider  amLODipine (NORVASC) 10 MG tablet TAKE 1 TABLET BY MOUTH EVERY DAY Patient not taking: Reported on 10/30/2022 10/28/21   Eulis Foster, FNP  Calcipotriene-Betameth Diprop (ENSTILAR) 0.005-0.064 % FOAM Apply to affected areas qd-bid prn, Avoid applying to face, groin, and axilla. Use as directed. Long-term use can cause thinning of the skin. 10/30/22   Elie Goody, MD  celecoxib (CELEBREX) 200 MG capsule TAKE 1 CAPSULE (200 MG TOTAL) BY MOUTH DAILY AFTER BREAKFAST. 01/21/23   Sherlene Shams, MD  cloNIDine (CATAPRES) 0.1 MG tablet TAKE 1 TABLET (0.1 MG TOTAL) BY MOUTH AT BEDTIME AS NEEDED. 10/01/22   Sherlene Shams, MD  divalproex (DEPAKOTE) 250 MG DR tablet 3 tablets in the morning,  2 tablets in the evening 05/23/22   Sherlene Shams, MD  finasteride (PROPECIA) 1 MG tablet Take 1 mg by mouth daily. 03/14/21   [provider]  omeprazole (PRILOSEC) 20 MG capsule Take 20 mg by mouth daily.    [provider]    Social History   Socioeconomic History   Marital status: Divorced    Spouse name: Not on  file   Number of children: 1   Years of education: Not on file   Highest education level: Not on file  Occupational History   Occupation: business owner  Tobacco Use   Smoking status: Every Day    Current packs/day: 0.50    Average packs/day: 0.5 packs/day for 30.0 years (15.0 ttl pk-yrs)    Types: Cigarettes   Smokeless tobacco: Never  Vaping Use   Vaping status: Never Used  Substance and Sexual Activity   Alcohol use: Yes    Comment: Fifth Per Day   Drug use: Not Currently     Types: Cocaine   Sexual activity: Yes    Partners: Female    Birth control/protection: Condom  Other Topics Concern   Not on file  Social History Narrative   Not on file   Social Determinants of Health   Financial Resource Strain: Not on file  Food Insecurity: Not on file  Transportation Needs: Not on file  Physical Activity: Not on file  Stress: Not on file  Social Connections: Not on file  Intimate Partner Violence: Not on file     Family History  Problem Relation Age of Onset   Hypertension Father    Prostate cancer Father    Hypertension Brother     ROS: Otherwise negative unless mentioned in HPI  Physical Examination  Vitals:   02/01/23 0751 02/01/23 1158  BP: 125/84 111/88  Pulse: 85 80  Resp: 17 16  Temp: 97.9 F (36.6 C) 98 F (36.7 C)  SpO2: 100% 99%   Body mass index is 23.73 kg/m.  General:  WDWN in NAD Gait: Not observed HENT: WNL, normocephalic Pulmonary: normal non-labored breathing, without Rales, rhonchi,  wheezing Cardiac: regular, without  Murmurs, rubs or gallops; without carotid bruits Abdomen: Positive bowel Sounds, soft, NT/ND, no masses Skin: without rashes Vascular Exam/Pulses: Palpable pulses throughout Extremities: without ischemic changes, without Gangrene , without cellulitis; without open wounds;  Musculoskeletal:muscle wasting or atrophy to right lower extremity calf muscle.   Neurologic: A&O X 3;Positive focal weakness to right lower extremity calf and foot.speech is fluent/normal Psychiatric:  The pt has Normal affect. Lymph:  Unremarkable  CBC    Component Value Date/Time   WBC 30.8 (H) 02/01/2023 0522   RBC 2.67 (L) 02/01/2023 0522   HGB 9.2 (L) 02/01/2023 0522   HCT 27.2 (L) 02/01/2023 0522   PLT 1,054 (HH) 02/01/2023 0522   MCV 101.9 (H) 02/01/2023 0522   MCH 34.5 (H) 02/01/2023 0522   MCHC 33.8 02/01/2023 0522   RDW 16.0 (H) 02/01/2023 0522   LYMPHSABS 2.2 01/26/2023 0508   MONOABS 5.0 (H) 01/26/2023 0508    EOSABS 0.1 01/26/2023 0508   BASOSABS 0.3 (H) 01/26/2023 0508    BMET    Component Value Date/Time   NA 131 (L) 02/01/2023 0522   K 3.5 02/01/2023 0522   CL 94 (L) 02/01/2023 0522   CO2 28 02/01/2023 0522   GLUCOSE 117 (H) 02/01/2023 0522   BUN <5 (L) 02/01/2023 0522   CREATININE 0.68 02/01/2023 0522   CREATININE 0.90 05/05/2021 1511   CALCIUM 7.7 (L) 02/01/2023 0522   GFRNONAA >60 02/01/2023 0522    COAGS: Lab Results  Component Value Date   INR 1.1 01/20/2023   INR 1.0 01/19/2023   INR 1.2 01/18/2023     Non-Invasive Vascular Imaging:   EXAM:01/31/23 CT OF THE LOWER RIGHT EXTREMITY WITH CONTRAST   TECHNIQUE: Multidetector CT imaging of the lower right extremity was performed  according to the standard protocol following intravenous contrast administration.   RADIATION DOSE REDUCTION: This exam was performed according to the departmental dose-optimization program which includes automated exposure control, adjustment of the mA and/or kV according to patient size and/or use of iterative reconstruction technique.   CONTRAST:  OMNIPAQUE IOHEXOL 300 MG/ML  SOLN   COMPARISON:  None Available.   FINDINGS: Bones/Joint/Cartilage   No bony destructive findings characteristic of osteomyelitis.   Tricompartmental osteoarthritis of the knee. Multiple small ossific fragments along the posterior margin of the knee joint suspicious for possible free fragments. Additional small free fragments believed to be present in a small Baker's cyst. Small knee joint effusion.   0.5 by 0.9 cm hypodensity compatible with non-fragmented osteochondral lesion along the medial talar dome, image 39 series 7.   Ligaments   Suboptimally assessed by CT.   Muscles and Tendons   Branching tubular hypodensities with enhancing margins in the medial head left gastrocnemius muscle measuring up to about 2.0 by 1.3 by 10.0 cm (volume = 14 cm^3), as shown for example on image 76  of series 5. Possibility of some of this appearance representing expanded and thrombosed intramuscular venous structures as raised given the tubular configuration.   Substantial expansion and edema throughout the medial head gastrocnemius with abnormal edema tracking within and along the margins of the medial head. Edema tracks along the adjacent fascia plane with the soleus muscle and within the superficial posterior compartment.   Soft tissues   Infiltrative edema diffusely along the popliteal space subcutaneous edema in the calf, favoring the posteromedial calf, with edema tracking along the superficial fascia margin of the superficial posterior compartment. No gas in the soft tissues.   IMPRESSION: 1. Branching tubular hypodensities with enhancing margins in the medial head gastrocnemius muscle measuring up to about 2.0 by 1.3 by 10.0 cm (volume = 14 cm^ 3). Possibility of some of this appearance representing expanded and thrombosed intramuscular venous structures as raised given the tubular configuration. Substantial expansion and edema throughout the medial head gastrocnemius with abnormal edema tracking within and along the margins of the medial head. Edema tracks along the adjacent fascia plane with the soleus muscle and within the superficial posterior compartment. Findings are compatible with myositis and intramuscular abscess. Other vessels are patent in the posterior compartment without definitive imaging evidence of compartment syndrome, although careful clinical correlation is suggested. 2. Infiltrative edema diffusely along the popliteal space and subcutaneous edema in the calf, favoring the posteromedial calf, with edema tracking along the superficial fascia margin of the superficial posterior compartment. 3. No bony destructive findings characteristic of osteomyelitis. 4. Tricompartmental osteoarthritis of the knee. Multiple small ossific fragments along the  posterior margin of the knee joint suspicious for free osteochondral fragments. Additional small free fragments believed to be present in a small Baker's cyst. 5. Small knee joint effusion. 6. 0.5 by 0.9 cm non-fragmented osteochondral lesion along the medial talar dome.  Statin:  No. Beta Blocker:  No. Aspirin:  No. ACEI:  No. ARB:  No. CCB use:  Yes Other antiplatelets/anticoagulants:  No.    ASSESSMENT/PLAN: This is a 52 y.o. male who presented to [facility] emergency department after being intoxicated and falling and hitting his head thus needing a craniotomy for subdural bleed.  During this hospitalization on 01/29/2023 he was noted to have right lower calf swelling.  Ultrasound for DVT was negative and also had a CT scan of his right lower extremity which was concerning for possible intra muscular  abscess although the area of concern seem to be tubular branching appearance.  The gastrinomas muscle appeared to represent expanding thrombosed intramuscular venous flow.  There was no abscess or or pocket of any infection noted.  Therefore vascular surgery recommends we continue antibiotics, nafcillin, which will cover any of his bacteremic MRSA and rescan in 48 hours to look for changes suggesting abscess or thrombophlebitis.  Prefer the scan sometime on Sunday, 02/03/2023.   -I discussed the plan in detail with Dr. Levora Dredge MD and he agrees with plan.   Marcie Bal [facility] 02/01/2023 12:21 PM

## 2023-02-01 NOTE — Progress Notes (Signed)
Date of Admission:  01/17/2023     ID: Samuel Riley is a 52 y.o. male Principal Problem:   Subdural hematoma (HCC) Active Problems:   Alcohol abuse   Right sided weakness   Traumatic brain compression with herniation, initial encounter (HCC)   Alcoholic intoxication without complication (HCC)   Alcohol use disorder, severe, dependence (HCC)   Pressure injury of skin   Bacteremia due to methicillin susceptible Staphylococcus aureus (MSSA)   Abscess of right lower extremity    Subjective: Pt has less pain calf Able to walk  Worked with PT  Medications:   vitamin C  500 mg Oral BID   divalproex  500 mg Oral QHS   divalproex  750 mg Oral Daily   enoxaparin (LOVENOX) injection  40 mg Subcutaneous QHS   feeding supplement  237 mL Oral BID BM   folic acid  1 mg Oral Daily   iron polysaccharides  150 mg Oral Daily   metoprolol tartrate  12.5 mg Oral BID   multivitamin with minerals  1 tablet Oral Daily   pantoprazole  40 mg Oral Daily   thiamine  100 mg Oral Daily   Vitamin D (Ergocalciferol)  50,000 Units Oral Q7 days    Objective: Vital signs in last 24 hours: Patient Vitals for the past 24 hrs:  BP Temp Temp src Pulse Resp SpO2 Weight  02/01/23 1934 131/84 97.7 F (36.5 C) -- 87 17 99 % --  02/01/23 1531 130/89 98.5 F (36.9 C) Oral 83 20 100 % --  02/01/23 1158 111/88 98 F (36.7 C) Oral 80 16 99 % --  02/01/23 0751 125/84 97.9 F (36.6 C) Oral 85 17 100 % --  02/01/23 0444 -- -- -- -- -- -- 70.8 kg  02/01/23 0425 129/85 99.3 F (37.4 C) -- 95 17 99 % --  01/31/23 2329 123/83 97.8 F (36.6 C) -- 84 18 99 % --      PHYSICAL EXAM:  General: Alert, cooperative, no distress, appears stated age.  Head: surgical site left hemicranium clean , no discharge , no erythema Eyes: Conjunctivae clear, anicteric sclerae. Pupils are equal ENT Nares normal. No drainage or sinus tenderness. Lips, mucosa, and tongue normal. No Thrush Neck: Supple, symmetrical, no  adenopathy, thyroid: non tender no carotid bruit and no JVD. Back: No CVA tenderness. Lungs: Clear to auscultation bilaterally. No Wheezing or Rhonchi. No rales. Heart: Regular rate and rhythm, no murmur, rub or gallop. Abdomen: Soft, non-tender,not distended. Bowel sounds normal. No masses Extremities: rt leg- calf area less swollen, firm tender area now a small area  Skin: No rashes or lesions. Or bruising Scab on his rt foot- no erytthema Lymph: Cervical, supraclavicular normal. Neurologic: Grossly non-focal  Lab Results    Latest Ref Rng & Units 02/01/2023    5:22 AM 01/31/2023    5:08 AM 01/30/2023    5:54 AM  CBC  WBC 4.0 - 10.5 K/uL 30.8  27.6  19.2   Hemoglobin 13.0 - 17.0 g/dL 9.2  9.8  9.1   Hematocrit 39.0 - 52.0 % 27.2  28.1  25.9   Platelets 150 - 400 K/uL 1,054  1,074  1,013        Latest Ref Rng & Units 02/01/2023    5:22 AM 01/31/2023    5:08 AM 01/30/2023    5:54 AM  CMP  Glucose 70 - 99 mg/dL 604  540  981   BUN 6 - 20 mg/dL <5  <5  <  5   Creatinine 0.61 - 1.24 mg/dL 6.57  8.46  9.62   Sodium 135 - 145 mmol/L 131  132  132   Potassium 3.5 - 5.1 mmol/L 3.5  3.6  3.5   Chloride 98 - 111 mmol/L 94  94  94   CO2 22 - 32 mmol/L 28  28  29    Calcium 8.9 - 10.3 mg/dL 7.7  7.9  7.6       Microbiology: [facility] 01/26/23 MSSa 01/28/23- Bc Studies/Results:  MR TIBIA FIBULA RIGHT W WO CONTRAST  Result Date: 01/31/2023 CLINICAL DATA:  Intramuscular abscess in the left calf EXAM: MRI OF LOWER RIGHT EXTREMITY WITHOUT AND WITH CONTRAST TECHNIQUE: Multiplanar, multisequence MR imaging of the left tibia/fibula was performed both before and after administration of intravenous contrast. CONTRAST:  7mL GADAVIST GADOBUTROL 1 MMOL/ML IV SOLN COMPARISON:  CT scan 01/30/2023 FINDINGS: Bones/Joint/Cartilage No abnormal edema signal or abnormal enhancement in the tibia or fibula to indicate osteomyelitis. Ligaments N/A Muscles and Tendons Diffuse abnormal edema throughout the medial head  gastrocnemius muscle and tracking along adjacent fascia planes including deep and superficial fascia planes. As on recent CT, there are abnormal branching irregular structures with thick enhancing margins tracking along the intramuscular vascular structures of the medial head gastrocnemius. These demonstrate some regions of high precontrast T1 signal, such as along the rim of one of the main portions of the collection on image 15 series 11, an overall the pattern of branching and distribution aligning with vascular structures favors septic thrombophlebitis of the medial head gastrocnemius muscle. Although there is still some adipose tissue interspersed within the medial head gastrocnemius, there is extensive and fairly diffuse intramuscular edema, potentially a pacing the patient at risk of muscle infarct, careful management recommended. Soft tissues Subcutaneous edema along the calf especially medially, cellulitis not excluded. No gas observed in the soft tissues. Subcutaneous edema extends down to the ankle medially and posterolaterally. IMPRESSION: 1. Septic thrombophlebitis of the medial head gastrocnemius muscle. Substantial edema in the medial head gastrocnemius with edema tracking along deep and superficial fascia margins of the muscle. 2. Subcutaneous edema along the calf especially medially, cellulitis not excluded. No gas observed in the soft tissues. 3. No osteomyelitis. Electronically Signed   By: Gaylyn Rong M.D.   On: 01/31/2023 20:19     Assessment/Plan: Pt admitted on 01/17/23 with LEft subdural hematoma following fall Encephalopathy due to the above- rt midline shift- s/p evacuation on 01/17/23 ETOH intoxication   Fever on 11/30 and BC MSSA bacteremia with sepsis- has pyomyositis VS septic phlebitis rt calf- this could be secondary to the skin wounds he had after the fall Improving on Nafcillin- vascular /surgery following- no surgical intervention now- imaging in 48 hrs Repeat  blood culture Neg  2 d echo no valvular vegetation Risk for endocarditis low On day 7 of IV antibiotic- after 2 weeks of IV may switch to PO for another 2 weeks    No evidence of meningitis clinically currently Abnormal CSF - with increased rbc is due to recent surgery/subdural hematoma- csf culture is pending 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

## 2023-02-01 NOTE — Progress Notes (Signed)
Inpatient Rehab Admissions Coordinator:   Medical workup ongoing.  Now with possible septic phlebitis of the gastroc muscle versus pyomyositis.  Surgery following and discussing with vascular surgery for POC.  We will continue to follow.   Estill Dooms, PT, DPT Admissions Coordinator [phone] 02/01/23  10:12 AM

## 2023-02-01 NOTE — Progress Notes (Addendum)
Occupational Therapy Treatment Patient Details Name: Samuel Riley MRN: [id number] DOB: September 26, 1970 Today's Date: 02/01/2023   History of present illness Pt is a 52 year old male presenting to the ED with alcohol intoxication and multiple falls while intoxicated. Pt is admitted with subdural hematoma, status post fall S/p left craniotomy for hematoma evacuation done on 11/21   OT comments  Chart reviewed - MD cleared for mobility prior to tx session. Pt received supine in bed, eager for mobility. Session focused on progression of functional mobility with dual-task and balance challenges, IADL bed making activity and building self-awareness while improving overall frustration tolerance. Pt quickly irritated if a task becomes difficult, but is easily redirectable and motivated to collaborate with OT. CGA - supervision level for mobility, stands to put pillowcases and no LOB while unmaking bed. Functional mobility while discussing favorite holidays during obstacle negotiation, min vcs for safety. See flowsheet for further details. Limitations to efficient occupational performance are decreased insight ino deficits, poor safety awareness and higher level executive functioning deficits. Patient will benefit from intensive inpatient follow up therapy, >3 hours/day. Pt will need 24/7 supervision upon hospital discharge. Pt making progress, will continue to follow POC.       If plan is discharge home, recommend the following:  A little help with bathing/dressing/bathroom;Assistance with cooking/housework;Direct supervision/assist for medications management;Assist for transportation;Supervision due to cognitive status;Help with stairs or ramp for entrance;Direct supervision/assist for financial management   Equipment Recommendations  None recommended by OT    Recommendations for Other Services Rehab consult    Precautions / Restrictions Precautions Precautions: Fall Precaution Comments: impulsive,  poor safety awareness Restrictions Weight Bearing Restrictions: No       Mobility Bed Mobility Overal bed mobility: Independent Bed Mobility: Supine to Sit, Sit to Supine     Supine to sit: Independent Sit to supine: Independent        Transfers Overall transfer level: Needs assistance Equipment used: None Transfers: Sit to/from Stand Sit to Stand: Supervision           General transfer comment: supervision due to impulsivity     Balance Overall balance assessment: Needs assistance Sitting-balance support: No upper extremity supported, Feet supported Sitting balance-Leahy Scale: Normal     Standing balance support: No upper extremity supported Standing balance-Leahy Scale: Good               High level balance activites: Turns, Sudden stops, Head turns High Level Balance Comments: impulsive, limited insight into deficits and limits of stability           ADL either performed or assessed with clinical judgement   ADL Overall ADL's : Needs assistance/impaired                                     Functional mobility during ADLs: Supervision/safety General ADL Comments: Pt performing functional mobility in hallway 2 laps around unit, pushing IV pole, successfully navigating objects (beds, dinomaps, people) with min vcs. IADL bed making task - pt with poor frusteration tolerance; initally stating there was a flat sheet on the countertop (there was not), OT allows pt time to self-monitor error and pt easily cursing and requiring redirection to calm self. Pt sits EOB with brother in room while OT provides full setup, and pt completes bed making task, putting sheet on backwards and becoming frusterated while insisting he can perform alone. OT grades task down and  offers one step directions with pt standing to put pilllowcases on.      Cognition Arousal: Alert Behavior During Therapy: Impulsive Overall Cognitive Status: Impaired/Different from  baseline Area of Impairment: Attention, Memory, Following commands, Safety/judgement, Awareness, Problem solving                   Current Attention Level: Selective Memory: Decreased short-term memory Following Commands: Follows one step commands consistently, Follows multi-step commands inconsistently, Follows multi-step commands with increased time Safety/Judgement: Decreased awareness of safety, Decreased awareness of deficits Awareness: Emergent   General Comments: continues to present with impaired cognition and poor safety awareness. Pt hooked up to IV during tx; requires vcs for safety during mobility and IADL bed making task. Pt denies issues with cognition (specifically mentions he was initally confused after surgery, but he is "perfectly fine" now. Fair to poor frusteration tolerance                   Pertinent Vitals/ Pain       Pain Assessment Pain Assessment: Faces Faces Pain Scale: Hurts a little bit Pain Location: R calf Pain Descriptors / Indicators: Aching Pain Intervention(s): Monitored during session, Limited activity within patient's tolerance   Frequency  Min 1X/week        Progress Toward Goals  OT Goals(current goals can now be found in the care plan section)  Progress towards OT goals: Progressing toward goals  Acute Rehab OT Goals Patient Stated Goal: to go home OT Goal Formulation: With patient Time For Goal Achievement: 02/05/23 Potential to Achieve Goals: Good ADL Goals Pt Will Perform Grooming: with modified independence;sitting Pt Will Perform Lower Body Dressing: with modified independence;sitting/lateral leans Pt Will Transfer to Toilet: with modified independence;ambulating Pt Will Perform Toileting - Clothing Manipulation and hygiene: with modified independence;sit to/from stand  Plan         AM-PAC OT "6 Clicks" Daily Activity     Outcome Measure   Help from another person eating meals?: None Help from another person  taking care of personal grooming?: None Help from another person toileting, which includes using toliet, bedpan, or urinal?: A Little Help from another person bathing (including washing, rinsing, drying)?: A Little Help from another person to put on and taking off regular upper body clothing?: None Help from another person to put on and taking off regular lower body clothing?: A Little 6 Click Score: 21    End of Session Equipment Utilized During Treatment: Gait belt  OT Visit Diagnosis: Other abnormalities of gait and mobility (R26.89);Other symptoms and signs involving cognitive function   Activity Tolerance Patient tolerated treatment well   Patient Left in bed;with call bell/phone within reach;Other (comment) (RN cleared to leave alarm off)   Nurse Communication Mobility status        Time: 1308-6578 OT Time Calculation (min): 21 min  Charges: OT General Charges $OT Visit: 1 Visit OT Treatments $Self Care/Home Management : 8-22 mins  Mickael Mcnutt L. Aj [place], OTR/L  02/01/23, 4:22 PM
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
## 2023-02-01 NOTE — Progress Notes (Signed)
02/01/23  Patient had MRI of his right lower extremity which is suspicious for thrombophlebitis in the area of the proximal medial gastrocnemius muscle.  Given the findings, would defer treatment to Dr. Gilda Crease with vascular surgery who is aware of the MRI results.  General surgery will sign off but we are available if any questions/concerns.  Henrene Dodge, MD

## 2023-02-01 NOTE — TOC Progression Note (Signed)
Transition of Care [facility]) - Progression Note    Patient Details  Name: Samuel Riley MRN: [id number] Date of Birth: 12-20-1970  [facility] CM/SW Contact  Truddie Hidden, RN Phone Number: 02/01/2023, 10:45 AM  Clinical Narrative:    TOC continuing to follow patient's progress throughout discharge planning.   Expected Discharge Plan: Home/Self Care Barriers to Discharge: Continued Medical Work up  Expected Discharge Plan and Services                                               Social Determinants of Health (SDOH) Interventions SDOH Screenings   Depression (PHQ2-9): Low Risk  (08/08/2021)  Tobacco Use: High Risk (02/01/2023)    Readmission Risk Interventions     No data to display
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
## 2023-02-01 NOTE — Progress Notes (Signed)
Physical Therapy Treatment Patient Details Name: Samuel Riley MRN: [id number] DOB: [birth year] Today's Date: 02/01/2023   History of Present Illness Pt is a 52 year old male presenting to the ED with alcohol intoxication and multiple falls while intoxicated. Pt is admitted with subdural hematoma, status post fall S/p left craniotomy for hematoma evacuation done on 11/21    PT Comments  Patient supine in bed upon arrival, agreeable to PT session.  Continue to progress gait distances, and activities to challenge dynamic balance/cognition. Continue to require CGA throughout due to high level balance deficits that persist at this time. Pt with minimal frustration with dual task this date, and continue to remain motivated to participate. This therapist continues to recommend high-intensity, specialized rehab services in post-acute setting to maximize return to PLOF. Will continue to follow acutely.    If plan is discharge home, recommend the following: Assistance with cooking/housework;Assist for transportation;Help with stairs or ramp for entrance;Supervision due to cognitive status   Can travel by private vehicle        Equipment Recommendations  None recommended by PT    Recommendations for Other Services       Precautions / Restrictions Precautions Precautions: Fall Restrictions Weight Bearing Restrictions: No     Mobility  Bed Mobility Overal bed mobility: Independent Bed Mobility: Supine to Sit, Sit to Supine     Supine to sit: Independent Sit to supine: Independent        Transfers Overall transfer level: Needs assistance Equipment used: None Transfers: Sit to/from Stand Sit to Stand: Supervision           General transfer comment: supervision due to impulsivity    Ambulation/Gait Ambulation/Gait assistance: Supervision, Contact guard assist Gait Distance (Feet): 400 Feet Assistive device: None Gait Pattern/deviations: Step-through pattern, Decreased  stride length, Wide base of support Gait velocity: Decreased     General Gait Details: Reciprocal stepping pattern, continue to report  R calf pain.  Completed ambulation with high level balance, including head movement, changes in directions, sudden stop. CGA throughout; higher-level balance deficits persist. Engaged patietn in dual task including naming favorite foods, and ingredients   Stairs             Wheelchair Mobility     Tilt Bed    Modified Rankin (Stroke Patients Only)       Balance Overall balance assessment: Needs assistance Sitting-balance support: No upper extremity supported, Feet supported Sitting balance-Leahy Scale: Normal     Standing balance support: No upper extremity supported Standing balance-Leahy Scale: Good Standing balance comment: Moderate unsteadiness and imbalance with high level balance activities                            Cognition Arousal: Alert Behavior During Therapy: Impulsive Overall Cognitive Status: Impaired/Different from baseline                                          Exercises      General Comments        Pertinent Vitals/Pain Pain Assessment Pain Assessment: No/denies pain    Home Living                          Prior Function            PT Goals (current goals can  now be found in the care plan section) Acute Rehab PT Goals Patient Stated Goal: to go home as soon as possible PT Goal Formulation: With patient Time For Goal Achievement: 02/05/23 Potential to Achieve Goals: Fair Progress towards PT goals: Progressing toward goals    Frequency    Min 1X/week      PT Plan      Co-evaluation              AM-PAC PT "6 Clicks" Mobility   Outcome Measure  Help needed turning from your back to your side while in a flat bed without using bedrails?: None Help needed moving from lying on your back to sitting on the side of a flat bed without using  bedrails?: None Help needed moving to and from a bed to a chair (including a wheelchair)?: None Help needed standing up from a chair using your arms (e.g., wheelchair or bedside chair)?: None Help needed to walk in hospital room?: A Little Help needed climbing 3-5 steps with a railing? : A Little 6 Click Score: 22    End of Session Equipment Utilized During Treatment: Gait belt Activity Tolerance: Patient tolerated treatment well Patient left: in bed;with call bell/phone within reach;with bed alarm set Nurse Communication: Mobility status PT Visit Diagnosis: Muscle weakness (generalized) (M62.81);Unsteadiness on feet (R26.81)     Time: 1350-1401 PT Time Calculation (min) (ACUTE ONLY): 11 min  Charges:    $Neuromuscular Re-education: 8-22 mins PT General Charges $$ ACUTE PT VISIT: 1 Visit                     Creed Copper Fairly, PT, DPT 02/01/23 2:10 PM

## 2023-02-02 DIAGNOSIS — S065XAA Traumatic subdural hemorrhage with loss of consciousness status unknown, initial encounter: Secondary | ICD-10-CM | POA: Diagnosis not present

## 2023-02-02 LAB — CBC
HCT: 28.2 % — ABNORMAL LOW (ref 39.0–52.0)
Hemoglobin: 9.5 g/dL — ABNORMAL LOW (ref 13.0–17.0)
MCH: 33.2 pg (ref 26.0–34.0)
MCHC: 33.7 g/dL (ref 30.0–36.0)
MCV: 98.6 fL (ref 80.0–100.0)
Platelets: 1024 10*3/uL (ref 150–400)
RBC: 2.86 MIL/uL — ABNORMAL LOW (ref 4.22–5.81)
RDW: 16.3 % — ABNORMAL HIGH (ref 11.5–15.5)
WBC: 26.6 10*3/uL — ABNORMAL HIGH (ref 4.0–10.5)
nRBC: 0.4 % — ABNORMAL HIGH (ref 0.0–0.2)

## 2023-02-02 LAB — BASIC METABOLIC PANEL
Anion gap: 8 (ref 5–15)
BUN: <5 mg/dL — ABNORMAL LOW (ref 6–20)
CO2: 28 mmol/L (ref 22–32)
Calcium: 7.9 mg/dL — ABNORMAL LOW (ref 8.9–10.3)
Chloride: 97 mmol/L — ABNORMAL LOW (ref 98–111)
Creatinine, Ser: 0.6 mg/dL — ABNORMAL LOW (ref 0.61–1.24)
GFR, Estimated: >60 mL/min (ref >60)
Glucose, Bld: 113 mg/dL — ABNORMAL HIGH (ref 70–99)
Potassium: 3.9 mmol/L (ref 3.5–5.1)
Sodium: 133 mmol/L — ABNORMAL LOW (ref 135–145)

## 2023-02-02 LAB — DIFFERENTIAL
Abs Immature Granulocytes: 5.52 10*3/uL — ABNORMAL HIGH (ref 0.00–0.07)
Basophils Absolute: 0.5 10*3/uL — ABNORMAL HIGH (ref 0.0–0.1)
Basophils Relative: 2 %
Eosinophils Absolute: 0.2 10*3/uL (ref 0.0–0.5)
Eosinophils Relative: 1 %
Immature Granulocytes: 21 %
Lymphocytes Relative: 13 %
Lymphs Abs: 3.5 10*3/uL (ref 0.7–4.0)
Monocytes Absolute: 2.6 10*3/uL — ABNORMAL HIGH (ref 0.1–1.0)
Monocytes Relative: 10 %
Neutro Abs: 14.4 10*3/uL — ABNORMAL HIGH (ref 1.7–7.7)
Neutrophils Relative %: 53 %
Smear Review: INCREASED

## 2023-02-02 LAB — CULTURE, BLOOD (ROUTINE X 2)
Culture: NO GROWTH
Culture: NO GROWTH
Special Requests: ADEQUATE
Special Requests: ADEQUATE

## 2023-02-02 MED ORDER — HYALURONIDASE HUMAN 150 UNIT/ML IJ SOLN
150.0000 [IU] | Freq: Once | INTRAMUSCULAR | Status: AC
  Administered 2023-02-02: 150 [IU] via SUBCUTANEOUS
  Filled 2023-02-02: qty 1

## 2023-02-02 NOTE — Plan of Care (Signed)
°  Problem: Health Behavior/Discharge Planning: Goal: Ability to manage health-related needs will improve Outcome: Progressing   Problem: Clinical Measurements: Goal: Ability to maintain clinical measurements within normal limits will improve Outcome: Progressing   Problem: Nutrition: Goal: Adequate nutrition will be maintained Outcome: Progressing   Problem: Coping: Goal: Level of anxiety will decrease Outcome: Progressing   

## 2023-02-02 NOTE — Progress Notes (Addendum)
Patient's IV got infiltrated with IV antibiotic (Nafcillin), infusion stopped, removed the IV and  Pharmacy made aware of Samuel Riley in pharmacy), recommended s/c hyaluronidase to be given.
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
## 2023-02-02 NOTE — Progress Notes (Signed)
Subjective: Interval History: none..   Objective: Vital signs in last 24 hours: Temp:  [97.7 F (36.5 C)-99.2 F (37.3 C)] 98.1 F (36.7 C) (12/07 0833) Pulse Rate:  [80-91] 87 (12/07 0833) Resp:  [16-20] 17 (12/07 0446) BP: (111-140)/(84-93) 136/87 (12/07 0833) SpO2:  [98 %-100 %] 99 % (12/07 0833) Weight:  [72 kg] 72 kg (12/07 0500)  Intake/Output from previous day: 12/06 0701 - 12/07 0700 In: 16.3 [IV Piggyback:16.3] Out: -  Intake/Output this shift: No intake/output data recorded.  Extremities: extremities normal, atraumatic, no cyanosis or edema  Lab Results: Recent Labs    02/01/23 0522 02/02/23 0726  WBC 30.8* 26.6*  HGB 9.2* 9.5*  HCT 27.2* 28.2*  PLT 1,054* 1,024*   BMET Recent Labs    02/01/23 0522 02/02/23 0726  NA 131* 133*  K 3.5 3.9  CL 94* 97*  CO2 28 28  GLUCOSE 117* 113*  BUN <5* <5*  CREATININE 0.68 0.60*  CALCIUM 7.7* 7.9*    Studies/Results: MR TIBIA FIBULA RIGHT W WO CONTRAST  Result Date: 01/31/2023 CLINICAL DATA:  Intramuscular abscess in the left calf EXAM: MRI OF LOWER RIGHT EXTREMITY WITHOUT AND WITH CONTRAST TECHNIQUE: Multiplanar, multisequence MR imaging of the left tibia/fibula was performed both before and after administration of intravenous contrast. CONTRAST:  7mL GADAVIST GADOBUTROL 1 MMOL/ML IV SOLN COMPARISON:  CT scan 01/30/2023 FINDINGS: Bones/Joint/Cartilage No abnormal edema signal or abnormal enhancement in the tibia or fibula to indicate osteomyelitis. Ligaments N/A Muscles and Tendons Diffuse abnormal edema throughout the medial head gastrocnemius muscle and tracking along adjacent fascia planes including deep and superficial fascia planes. As on recent CT, there are abnormal branching irregular structures with thick enhancing margins tracking along the intramuscular vascular structures of the medial head gastrocnemius. These demonstrate some regions of high precontrast T1 signal, such as along the rim of one of the main  portions of the collection on image 15 series 11, an overall the pattern of branching and distribution aligning with vascular structures favors septic thrombophlebitis of the medial head gastrocnemius muscle. Although there is still some adipose tissue interspersed within the medial head gastrocnemius, there is extensive and fairly diffuse intramuscular edema, potentially a pacing the patient at risk of muscle infarct, careful management recommended. Soft tissues Subcutaneous edema along the calf especially medially, cellulitis not excluded. No gas observed in the soft tissues. Subcutaneous edema extends down to the ankle medially and posterolaterally. IMPRESSION: 1. Septic thrombophlebitis of the medial head gastrocnemius muscle. Substantial edema in the medial head gastrocnemius with edema tracking along deep and superficial fascia margins of the muscle. 2. Subcutaneous edema along the calf especially medially, cellulitis not excluded. No gas observed in the soft tissues. 3. No osteomyelitis. Electronically Signed   By: Gaylyn Rong M.D.   On: 01/31/2023 20:19   CT TIBIA FIBULA RIGHT W CONTRAST  Result Date: 01/31/2023 CLINICAL DATA:  Soft tissue infection, concern for abscess EXAM: CT OF THE LOWER RIGHT EXTREMITY WITH CONTRAST TECHNIQUE: Multidetector CT imaging of the lower right extremity was performed according to the standard protocol following intravenous contrast administration. RADIATION DOSE REDUCTION: This exam was performed according to the departmental dose-optimization program which includes automated exposure control, adjustment of the mA and/or kV according to patient size and/or use of iterative reconstruction technique. CONTRAST:  OMNIPAQUE IOHEXOL 300 MG/ML  SOLN COMPARISON:  None Available. FINDINGS: Bones/Joint/Cartilage No bony destructive findings characteristic of osteomyelitis. Tricompartmental osteoarthritis of the knee. Multiple small ossific fragments along the posterior  margin  of the knee joint suspicious for possible free fragments. Additional small free fragments believed to be present in a small Baker's cyst. Small knee joint effusion. 0.5 by 0.9 cm hypodensity compatible with non-fragmented osteochondral lesion along the medial talar dome, image 39 series 7. Ligaments Suboptimally assessed by CT. Muscles and Tendons Branching tubular hypodensities with enhancing margins in the medial head left gastrocnemius muscle measuring up to about 2.0 by 1.3 by 10.0 cm (volume = 14 cm^3), as shown for example on image 76 of series 5. Possibility of some of this appearance representing expanded and thrombosed intramuscular venous structures as raised given the tubular configuration. Substantial expansion and edema throughout the medial head gastrocnemius with abnormal edema tracking within and along the margins of the medial head. Edema tracks along the adjacent fascia plane with the soleus muscle and within the superficial posterior compartment. Soft tissues Infiltrative edema diffusely along the popliteal space subcutaneous edema in the calf, favoring the posteromedial calf, with edema tracking along the superficial fascia margin of the superficial posterior compartment. No gas in the soft tissues. IMPRESSION: 1. Branching tubular hypodensities with enhancing margins in the medial head gastrocnemius muscle measuring up to about 2.0 by 1.3 by 10.0 cm (volume = 14 cm^ 3). Possibility of some of this appearance representing expanded and thrombosed intramuscular venous structures as raised given the tubular configuration. Substantial expansion and edema throughout the medial head gastrocnemius with abnormal edema tracking within and along the margins of the medial head. Edema tracks along the adjacent fascia plane with the soleus muscle and within the superficial posterior compartment. Findings are compatible with myositis and intramuscular abscess. Other vessels are patent in the posterior  compartment without definitive imaging evidence of compartment syndrome, although careful clinical correlation is suggested. 2. Infiltrative edema diffusely along the popliteal space and subcutaneous edema in the calf, favoring the posteromedial calf, with edema tracking along the superficial fascia margin of the superficial posterior compartment. 3. No bony destructive findings characteristic of osteomyelitis. 4. Tricompartmental osteoarthritis of the knee. Multiple small ossific fragments along the posterior margin of the knee joint suspicious for free osteochondral fragments. Additional small free fragments believed to be present in a small Baker's cyst. 5. Small knee joint effusion. 6. 0.5 by 0.9 cm non-fragmented osteochondral lesion along the medial talar dome. Electronically Signed   By: Gaylyn Rong M.D.   On: 01/31/2023 09:34   US Venous Img Lower Bilateral (DVT)  Result Date: 01/29/2023 CLINICAL DATA:  Bilateral lower extremity edema and right calf erythema. EXAM: BILATERAL LOWER EXTREMITY VENOUS DOPPLER ULTRASOUND TECHNIQUE: Gray-scale sonography with graded compression, as well as color Doppler and duplex ultrasound were performed to evaluate the lower extremity deep venous systems from the level of the common femoral vein and including the common femoral, femoral, profunda femoral, popliteal and calf veins including the posterior tibial, peroneal and gastrocnemius veins when visible. The superficial great saphenous vein was also interrogated. Spectral Doppler was utilized to evaluate flow at rest and with distal augmentation maneuvers in the common femoral, femoral and popliteal veins. COMPARISON:  None Available. FINDINGS: RIGHT LOWER EXTREMITY Common Femoral Vein: No evidence of thrombus. Normal compressibility, respiratory phasicity and response to augmentation. Saphenofemoral Junction: No evidence of thrombus. Normal compressibility and flow on color Doppler imaging. Profunda Femoral Vein:  No evidence of thrombus. Normal compressibility and flow on color Doppler imaging. Femoral Vein: No evidence of thrombus. Normal compressibility, respiratory phasicity and response to augmentation. Popliteal Vein: No evidence of thrombus. Normal compressibility, respiratory phasicity  and response to augmentation. Calf Veins: No evidence of thrombus. Normal compressibility and flow on color Doppler imaging. Superficial Great Saphenous Vein: No evidence of thrombus. Normal compressibility. Venous Reflux:  None. Other Findings: No evidence of superficial thrombophlebitis or abnormal fluid collection. LEFT LOWER EXTREMITY Common Femoral Vein: No evidence of thrombus. Normal compressibility, respiratory phasicity and response to augmentation. Saphenofemoral Junction: No evidence of thrombus. Normal compressibility and flow on color Doppler imaging. Profunda Femoral Vein: No evidence of thrombus. Normal compressibility and flow on color Doppler imaging. Femoral Vein: No evidence of thrombus. Normal compressibility, respiratory phasicity and response to augmentation. Popliteal Vein: No evidence of thrombus. Normal compressibility, respiratory phasicity and response to augmentation. Calf Veins: No evidence of thrombus. Normal compressibility and flow on color Doppler imaging. Superficial Great Saphenous Vein: No evidence of thrombus. Normal compressibility. Venous Reflux:  None. Other Findings: No evidence of superficial thrombophlebitis or abnormal fluid collection. IMPRESSION: No evidence of deep venous thrombosis in either lower extremity. Electronically Signed   By: Irish Lack M.D.   On: 01/29/2023 16:55   ECHOCARDIOGRAM COMPLETE  Result Date: 01/28/2023    ECHOCARDIOGRAM REPORT   Patient Name:   HOLLISTER BEAZLEY Date of Exam: 01/28/2023 Medical Rec #:  [id number]         Height:       68.0 in Accession #:    [id number]        Weight:       151.7 lb Date of Birth:  Jan 24, 1971         BSA:          1.817 m  Patient Age:    52 years          BP:           013/80 mmHg Patient Gender: M                 HR:           103 bpm. Exam Location:  [facility] Procedure: 2D Echo, Cardiac Doppler and Color Doppler Indications:     Bacteremia  History:         Patient has no prior history of Echocardiogram examinations.                  Signs/Symptoms:Bacteremia; Risk Factors:Hypertension,                  Dyslipidemia and Current Smoker. ETOH abuse.  Sonographer:     Mikki Harbor Referring Phys:  [id number] Sunnie Nielsen Diagnosing Phys: Lorine Bears MD IMPRESSIONS  1. Left ventricular ejection fraction, by estimation, is 65 to 70%. The left ventricle has normal function. The left ventricle has no regional wall motion abnormalities. There is mild left ventricular hypertrophy. Left ventricular diastolic parameters were normal.  2. Right ventricular systolic function is normal. The right ventricular size is normal. There is normal pulmonary artery systolic pressure.  3. The mitral valve is normal in structure. Mild mitral valve regurgitation. No evidence of mitral stenosis.  4. The aortic valve is normal in structure. Aortic valve regurgitation is not visualized. Aortic valve sclerosis is present, with no evidence of aortic valve stenosis.  5. The inferior vena cava is normal in size with greater than 50% respiratory variability, suggesting right atrial pressure of 3 mmHg. Conclusion(s)/Recommendation(s): No evidence of valvular vegetations on this transthoracic echocardiogram. FINDINGS  Left Ventricle: Left ventricular ejection fraction, by estimation, is 65 to 70%. The left ventricle has normal function. The left ventricle has no  regional wall motion abnormalities. The left ventricular internal cavity size was normal in size. There is  mild left ventricular hypertrophy. Left ventricular diastolic parameters were normal. Right Ventricle: The right ventricular size is normal. No increase in right ventricular wall thickness. Right  ventricular systolic function is normal. There is normal pulmonary artery systolic pressure. The tricuspid regurgitant velocity is 1.95 m/s, and  with an assumed right atrial pressure of 3 mmHg, the estimated right ventricular systolic pressure is 18.2 mmHg. Left Atrium: Left atrial size was normal in size. Right Atrium: Right atrial size was normal in size. Pericardium: There is no evidence of pericardial effusion. Mitral Valve: The mitral valve is normal in structure. Mild mitral valve regurgitation. No evidence of mitral valve stenosis. MV peak gradient, 3.9 mmHg. The mean mitral valve gradient is 2.0 mmHg. Tricuspid Valve: The tricuspid valve is normal in structure. Tricuspid valve regurgitation is trivial. No evidence of tricuspid stenosis. Aortic Valve: The aortic valve is normal in structure. Aortic valve regurgitation is not visualized. Aortic valve sclerosis is present, with no evidence of aortic valve stenosis. Aortic valve mean gradient measures 9.0 mmHg. Aortic valve peak gradient measures 17.6 mmHg. Aortic valve area, by VTI measures 3.04 cm. Pulmonic Valve: The pulmonic valve was normal in structure. Pulmonic valve regurgitation is not visualized. No evidence of pulmonic stenosis. Aorta: The aortic root is normal in size and structure. Venous: The inferior vena cava is normal in size with greater than 50% respiratory variability, suggesting right atrial pressure of 3 mmHg. IAS/Shunts: No atrial level shunt detected by color flow Doppler.  LEFT VENTRICLE PLAX 2D LVIDd:         5.10 cm   Diastology LVIDs:         3.10 cm   LV e' medial:    11.00 cm/s LV PW:         0.90 cm   LV E/e' medial:  7.9 LV IVS:        1.20 cm   LV e' lateral:   13.50 cm/s LVOT diam:     2.00 cm   LV E/e' lateral: 6.4 LV SV:         113 LV SV Index:   62 LVOT Area:     3.14 cm  RIGHT VENTRICLE RV Basal diam:  3.65 cm RV Mid diam:    3.10 cm RV S prime:     20.40 cm/s TAPSE (M-mode): 3.0 cm LEFT ATRIUM             Index         RIGHT ATRIUM           Index LA diam:        3.50 cm 1.93 cm/m   RA Area:     16.00 cm LA Vol (A2C):   66.7 ml 36.70 ml/m  RA Volume:   40.90 ml  22.51 ml/m LA Vol (A4C):   42.3 ml 23.28 ml/m LA Biplane Vol: 58.0 ml 31.92 ml/m  AORTIC VALVE                     PULMONIC VALVE AV Area (Vmax):    2.69 cm      PV Vmax:       1.15 m/s AV Area (Vmean):   3.21 cm      PV Peak grad:  5.3 mmHg AV Area (VTI):     3.04 cm AV Vmax:  210.00 cm/s AV Vmean:          132.000 cm/s AV VTI:            0.372 m AV Peak Grad:      17.6 mmHg AV Mean Grad:      9.0 mmHg LVOT Vmax:         180.00 cm/s LVOT Vmean:        135.000 cm/s LVOT VTI:          0.360 m LVOT/AV VTI ratio: 0.97  AORTA Ao Root diam: 3.70 cm MITRAL VALVE               TRICUSPID VALVE MV Area (PHT): 3.05 cm    TR Peak grad:   15.2 mmHg MV Area VTI:   4.96 cm    TR Vmax:        195.00 cm/s MV Peak grad:  3.9 mmHg MV Mean grad:  2.0 mmHg    SHUNTS MV Vmax:       0.98 m/s    Systemic VTI:  0.36 m MV Vmean:      67.7 cm/s   Systemic Diam: 2.00 cm MV Decel Time: 249 msec MV E velocity: 87.00 cm/s MV A velocity: 69.30 cm/s MV E/A ratio:  1.26 Lorine Bears MD Electronically signed by Lorine Bears MD Signature Date/Time: 01/28/2023/1:00:11 PM    Final    CT HEAD WO CONTRAST ( )  Result Date: 01/27/2023 CLINICAL DATA:  Meningitis/CNS infection suspected. EXAM: CT HEAD WITHOUT CONTRAST TECHNIQUE: Contiguous axial images were obtained from the base of the skull through the vertex without intravenous contrast. RADIATION DOSE REDUCTION: This exam was performed according to the departmental dose-optimization program which includes automated exposure control, adjustment of the mA and/or kV according to patient size and/or use of iterative reconstruction technique. COMPARISON:  Yesterday FINDINGS: Brain: Subdural hematoma with evacuation on the left. Maximal thickness is at the frontal convexity anteriorly, measuring 12 mm. Local cerebral mass effect with  midline shift of 7 mm, unchanged. No new hemorrhage. No hydrocephalus, infarct, or mass. Stable pneumocephalus. Vascular: No hyperdense vessel or unexpected calcification. Skull: Unremarkable left-sided craniotomy Sinuses/Orbits: Negative IMPRESSION: Left subdural hematoma with recent evacuation. Unchanged residual blood clot and 7 mm of midline shift. Electronically Signed   By: Tiburcio Pea M.D.   On: 01/27/2023 04:59   CT HEAD WO CONTRAST ( )  Result Date: 01/26/2023 CLINICAL DATA:  Head trauma, moderate to severe. Follow-up craniotomy for subdural hemorrhage. EXAM: CT HEAD WITHOUT CONTRAST TECHNIQUE: Contiguous axial images were obtained from the base of the skull through the vertex without intravenous contrast. RADIATION DOSE REDUCTION: This exam was performed according to the departmental dose-optimization program which includes automated exposure control, adjustment of the mA and/or kV according to patient size and/or use of iterative reconstruction technique. COMPARISON:  Seven days prior FINDINGS: Brain: Subdural hematoma on the left shows decrease in pneumocephalus. A drain has also been removed. There is residual high-density blood clot measuring up to 11 mm in thickness along the frontal convexity. Midline shift is 7 mm towards the right. No entrapment or infarct. Vascular: No hyperdense vessel or unexpected calcification. Skull: Unremarkable recent craniotomy site. Sinuses/Orbits: Unremarkable IMPRESSION: Drain removal and resolving postoperative changes at the left subdural hematoma evacuation site. Maximal clot thickness is along the left frontal lobe at 11 mm. Midline shift is similar to prior at 7 mm. Electronically Signed   By: Tiburcio Pea M.D.   On: 01/26/2023 10:19   EEG adult  Result Date: 01/20/2023 Jefferson Fuel, MD     01/20/2023  5:16 PM Routine EEG Report Xzavien Santora is a 52 y.o. male with a history of subdural hematoma and altered mental status who is undergoing  an EEG to evaluate for seizures. Report: This EEG was acquired with electrodes placed according to the International 10-20 electrode system (including Fp1, Fp2, F3, F4, C3, C4, P3, P4, O1, O2, T3, T4, T5, T6, A1, A2, Fz, Cz, Pz). The following electrodes were missing or displaced: none. The occipital dominant rhythm was 5-6 Hz. This activity is reactive to stimulation. Drowsiness was manifested by background fragmentation; deeper stages of sleep were identified by K complexes and sleep spindles. There was focal slowing over the right hemisphere. There were no interictal epileptiform discharges. There were no electrographic seizures identified. There was no abnormal response to photic stimulation or hyperventilation. Impression and clinical correlation: This EEG was obtained while awake and asleep and is abnormal due to mild diffuse slowing indicative of global cerebral dysfunction and focal slowing indicating focal cerebral dysfunction over the right hemisphere. Epileptiform abnormalities were not seen during this recording. Bing Neighbors, MD [facility] [phone] If 7pm- 7am, please page neurology on call as listed in AMION.   CT HEAD WO CONTRAST ( )  Result Date: 01/19/2023 CLINICAL DATA:  Narrow deficit, acute, stroke suspected. Previous craniotomy for subdural evacuation. Mental status changes over the last day. EXAM: CT HEAD WITHOUT CONTRAST TECHNIQUE: Contiguous axial images were obtained from the base of the skull through the vertex without intravenous contrast. RADIATION DOSE REDUCTION: This exam was performed according to the departmental dose-optimization program which includes automated exposure control, adjustment of the mA and/or kV according to patient size and/or use of iterative reconstruction technique. COMPARISON:  01/18/2023 FINDINGS: Brain: No posterior fossa finding of significance. Right cerebral hemisphere show some chronic volume loss in the frontal lobe but no other  finding. Previous left craniotomy for subdural hematoma evacuation with subdural drain placement. Diminishing amount subdural blood and air on the left. No increased or new bleeding. Mass effect appears similar with left-to-right shift of 7-8 mm. Vascular: No acute vascular finding. Skull: Otherwise negative Sinuses/Orbits: Clear/normal Other: None IMPRESSION: Previous left craniotomy for subdural hematoma evacuation with subdural drain placement. Diminishing amount of subdural blood and air on the left. No increased or new bleeding. Mass effect appears similar with left-to-right shift of 7-8 mm. Electronically Signed   By: Paulina Fusi M.D.   On: 01/19/2023 15:17   CT HEAD WO CONTRAST ( )  Result Date: 01/18/2023 CLINICAL DATA:  Altered mental status, nontraumatic (Ped 0-17y). Head trauma. Left subdural hematoma. Postoperative from left-sided craniotomy. EXAM: CT HEAD WITHOUT CONTRAST TECHNIQUE: Contiguous axial images were obtained from the base of the skull through the vertex without intravenous contrast. RADIATION DOSE REDUCTION: This exam was performed according to the departmental dose-optimization program which includes automated exposure control, adjustment of the mA and/or kV according to patient size and/or use of iterative reconstruction technique. COMPARISON:  CT head from today. FINDINGS: Brain: No substantial change in size of a left subdural hemorrhage with intermixed gas, measuring approximately 1.1 cm. Left-sided craniotomy with drain in place. Similar rightward midline shift, approximately 8 mm. Small volume of intraventricular hemorrhage in the right lateral ventricle. No evidence of acute large vascular territory infarct, midline shift or hydrocephalus. Vascular: No hyperdense vessel. Skull: No acute fracture. Left-sided craniotomy with drain in place. Sinuses/Orbits: Mostly clear sinuses.  No acute orbital findings. IMPRESSION: 1. No substantial change in  size of a 1.1 cm left subdural  hemorrhage with similar 8 mm rightward midline shift 2. Similar small volume of intraventricular hemorrhage. Electronically Signed   By: Feliberto Harts M.D.   On: 01/18/2023 12:03   US Abdomen Limited RUQ (LIVER/GB)  Result Date: 01/18/2023 CLINICAL DATA:  Elevated liver function tests EXAM: ULTRASOUND ABDOMEN LIMITED RIGHT UPPER QUADRANT COMPARISON:  01/18/2021 ultrasound FINDINGS: Gallbladder: Gallbladder is distended. No shadowing stones, wall thickening or adjacent fluid. Common bile duct: Diameter: 5 mm Liver: Diffusely echogenic hepatic parenchyma consistent with fatty liver infiltration. With this level of echogenicity evaluation underlying mass lesion is limited and if needed follow-up contrast CT or MRI as clinically appropriate. Portal vein is patent on color Doppler imaging with normal direction of blood flow towards the liver. Other: Trace free fluid. IMPRESSION: Distended gallbladder but no stones.  No duct dilatation. Fatty liver infiltration.  Trace abdominal free fluid. Electronically Signed   By: Karen Kays M.D.   On: 01/18/2023 11:55   CT HEAD WO CONTRAST ( )  Result Date: 01/18/2023 CLINICAL DATA:  52 year old male status post fall with head trauma. Left subdural hematoma. Postoperative day 1 left side craniotomy and hematoma evacuation. EXAM: CT HEAD WITHOUT CONTRAST TECHNIQUE: Contiguous axial images were obtained from the base of the skull through the vertex without intravenous contrast. RADIATION DOSE REDUCTION: This exam was performed according to the departmental dose-optimization program which includes automated exposure control, adjustment of the mA and/or kV according to patient size and/or use of iterative reconstruction technique. COMPARISON:  Preoperative CT yesterday. FINDINGS: Brain: Left subdural drain now in place. Decreased left side subdural hematoma. Largely hyperdense residual blood products plus some postoperative pneumocephalus measuring 5-6 mm thickness at  most levels, but up to 11 mm along the left anterior frontal convexity (decreased from 16 mm at the same level preoperatively. Small volume of para falcine and left tentorial blood also. Unresolved mass effect on the brain. Rightward midline shift is 7-8 mm now versus 15 mm preoperatively. Ventricle size and configuration not significantly changed. Small volume of layering intraventricular blood now. And trace right hemisphere subarachnoid blood also (sagittal image 24). Possible small hemorrhagic contusion in the contralateral right temporal lobe on coronal image 38. Alternatively this could be trace right hemisphere extra-axial hemorrhage, regardless is stable. Basilar cisterns remain patent. No cortically based acute infarct identified. Vascular: Calcified atherosclerosis at the skull base. No suspicious intracranial vascular hyperdensity. Skull: New left side craniotomy.  Otherwise stable and intact. Sinuses/Orbits: Scattered paranasal sinus mucosal thickening and polypoid opacity but Visualized paranasal sinuses and mastoids are stable and well aerated. There is an anterior nasal septal defect. Other: Postoperative changes to the scalp now superimposed on anterior forehead scalp hematoma. Ongoing periorbital soft tissue swelling. Globes and intraorbital soft tissues appear to remain intact. IMPRESSION: 1. Decreased left side subdural hematoma on postoperative day 1. Residual hyperdense SDH with some pneumocephalus mostly ranges from 6-11 mm thickness. Small volume intraventricular and right hemisphere subarachnoid hemorrhage now. And tiny right temporal lobe region hemorrhagic contusion versus extra-axial blood, stable. 2. Decreased Rightward midline shift, now 7-8 mm. No ventriculomegaly. Basilar cisterns remain patent. Electronically Signed   By: Odessa Fleming M.D.   On: 01/18/2023 05:44   DG Abd 1 View  Result Date: 01/17/2023 CLINICAL DATA:  Orogastric tube placement. EXAM: ABDOMEN - 1 VIEW COMPARISON:  None  Available. FINDINGS: Tip and side port of the enteric tube below the diaphragm in the stomach. Normal bowel gas pattern without obstruction. No radiopaque  calculi. IMPRESSION: Tip and side port of the enteric tube below the diaphragm in the stomach. Electronically Signed   By: Narda Rutherford M.D.   On: 01/17/2023 21:33   DG Chest Port 1 View  Result Date: 01/17/2023 CLINICAL DATA:  Leukocytosis. Intubation. Orogastric tube placement. EXAM: PORTABLE CHEST 1 VIEW COMPARISON:  Chest radiograph 10/13/2021 FINDINGS: Endotracheal tube tip 6.4 cm from the carina. Tip and side port of the enteric tube below the diaphragm in the stomach. Mild elevation of right hemidiaphragm. No focal airspace disease. Normal heart size with stable mediastinal contours. Mild aortic atherosclerosis. No pneumothorax, pulmonary edema or large pleural effusion. Remote left rib fractures. IMPRESSION: 1. Endotracheal tube tip 6.4 cm from the carina. 2. Enteric tube tip and side port below the diaphragm in the stomach. 3. No acute pulmonary process. Electronically Signed   By: Narda Rutherford M.D.   On: 01/17/2023 21:27   CT Head Wo Contrast  Result Date: 01/17/2023 CLINICAL DATA:  Head trauma, skull fracture or hematoma (Age 84-64y) FALL; FALL EXAM: CT HEAD WITHOUT CONTRAST CT CERVICAL SPINE WITHOUT CONTRAST TECHNIQUE: Multidetector CT imaging of the head and cervical spine was performed following the standard protocol without intravenous contrast. Multiplanar CT image reconstructions of the cervical spine were also generated. RADIATION DOSE REDUCTION: This exam was performed according to the departmental dose-optimization program which includes automated exposure control, adjustment of the mA and/or kV according to patient size and/or use of iterative reconstruction technique. COMPARISON:  None Available. FINDINGS: CT HEAD FINDINGS Brain: Acute/hyperdense 1.3 cm thick left cerebral convexity subdural hemorrhage which also tracks  along the falx. Significant (1.5 cm) rightward midline shift. No evidence of acute large vascular territory infarct, mass lesion, or hydrocephalus. Effacement of the left lateral ventricle. Vascular: Calcific atherosclerosis. Skull: No acute fracture.  Left posterior scalp Sinuses/Orbits: Clear sinuses.  No acute orbital findings. Other: No mastoid effusions. CT CERVICAL SPINE FINDINGS Significantly motion limited.  Within this limitation: Six Alignment: No definite malalignment within limitation of motion. Skull base and vertebrae: No definite fracture within limitation of motion. Corticated bony fragment at the craniocervical junction appears remote. Soft tissues and spinal canal: No prevertebral fluid or swelling. No visible large canal hematoma. Upper chest: Visualized lung apices are clear. IMPRESSION: 1. Acute large (1.3 cm thick) left cerebral convexity subdural hemorrhage. 2. Significant (1.5 cm) rightward midline shift. 3. Severely motion limited CT of the cervical spine without obvious acute fracture or traumatic malalignment. Findings discussed with Dr. Modesto Charon via telephone at 3:40 p.m. Electronically Signed   By: Feliberto Harts M.D.   On: 01/17/2023 15:48   CT Cervical Spine Wo Contrast  Result Date: 01/17/2023 CLINICAL DATA:  Head trauma, skull fracture or hematoma (Age 50-64y) FALL; FALL EXAM: CT HEAD WITHOUT CONTRAST CT CERVICAL SPINE WITHOUT CONTRAST TECHNIQUE: Multidetector CT imaging of the head and cervical spine was performed following the standard protocol without intravenous contrast. Multiplanar CT image reconstructions of the cervical spine were also generated. RADIATION DOSE REDUCTION: This exam was performed according to the departmental dose-optimization program which includes automated exposure control, adjustment of the mA and/or kV according to patient size and/or use of iterative reconstruction technique. COMPARISON:  None Available. FINDINGS: CT HEAD FINDINGS Brain:  Acute/hyperdense 1.3 cm thick left cerebral convexity subdural hemorrhage which also tracks along the falx. Significant (1.5 cm) rightward midline shift. No evidence of acute large vascular territory infarct, mass lesion, or hydrocephalus. Effacement of the left lateral ventricle. Vascular: Calcific atherosclerosis. Skull: No acute fracture.  Left posterior scalp Sinuses/Orbits: Clear sinuses.  No acute orbital findings. Other: No mastoid effusions. CT CERVICAL SPINE FINDINGS Significantly motion limited.  Within this limitation: Six Alignment: No definite malalignment within limitation of motion. Skull base and vertebrae: No definite fracture within limitation of motion. Corticated bony fragment at the craniocervical junction appears remote. Soft tissues and spinal canal: No prevertebral fluid or swelling. No visible large canal hematoma. Upper chest: Visualized lung apices are clear. IMPRESSION: 1. Acute large (1.3 cm thick) left cerebral convexity subdural hemorrhage. 2. Significant (1.5 cm) rightward midline shift. 3. Severely motion limited CT of the cervical spine without obvious acute fracture or traumatic malalignment. Findings discussed with Dr. Modesto Charon via telephone at 3:40 p.m. Electronically Signed   By: Feliberto Harts M.D.   On: 01/17/2023 15:48   Anti-infectives: Anti-infectives (From admission, onward)    Start     Dose/Rate Route Frequency Ordered Stop   01/27/23 1800  nafcillin 2 g in sodium chloride 0.9 % 100 mL IVPB        2 g 216 mL/hr over 30 Minutes Intravenous  Once 01/27/23 1713 01/27/23 1953   01/27/23 1700  nafcillin 12 g in sodium chloride 0.9 % 500 mL continuous infusion        12 g 20.8 mL/hr over 24 Hours Intravenous Every 24 hours 01/27/23 1603     01/27/23 1700  nafcillin injection 2 g  Status:  Discontinued        2 g Intravenous STAT 01/27/23 1604 01/27/23 1713   01/27/23 0600  ceFAZolin (ANCEF) IVPB 2g/100 mL premix  Status:  Discontinued        2 g 200 mL/hr over 30  Minutes Intravenous Every 8 hours 01/27/23 0317 01/27/23 1603   01/26/23 2200  vancomycin (VANCOREADY) IVPB 1250 mg/250 mL  Status:  Discontinued        1,250 mg 166.7 mL/hr over 90 Minutes Intravenous Every 12 hours 01/26/23 1304 01/27/23 0318   01/26/23 1200  ceFEPIme (MAXIPIME) 2 g in sodium chloride 0.9 % 100 mL IVPB  Status:  Discontinued        2 g 200 mL/hr over 30 Minutes Intravenous Every 8 hours 01/26/23 1017 01/27/23 0318   01/26/23 1115  vancomycin (VANCOREADY) IVPB 1500 mg/300 mL        1,500 mg 150 mL/hr over 120 Minutes Intravenous  Once 01/26/23 1017 01/27/23 0700       Assessment/Plan: s/p Procedure(s): CRANIOTOMY HEMATOMA EVACUATION SUBDURAL (Left) At this point in time, he really does not have any significant process that is painful in the right calf on clinical examination. He wants to go home.  Clinically he appears to not have significant infection or inflammatory process.  He says his right calf subjectively to him is much better.  I would not object to him going home and following up at a later date.  Otherwise if he stays then CT scan of the right calf can be done looking for coalescence of any process here, but is not clear that anything should be done if this is seen as he is clinically essentially markedly improved, almost asymptomatic.   LOS: 16 days   Learta Codding 02/02/2023, 8:55 AM

## 2023-02-03 ENCOUNTER — Encounter: Payer: Self-pay | Admitting: Pulmonary Disease

## 2023-02-03 ENCOUNTER — Inpatient Hospital Stay: Payer: BC Managed Care – PPO

## 2023-02-03 DIAGNOSIS — S065XAA Traumatic subdural hemorrhage with loss of consciousness status unknown, initial encounter: Secondary | ICD-10-CM | POA: Diagnosis not present

## 2023-02-03 LAB — BASIC METABOLIC PANEL
Anion gap: 9 (ref 5–15)
BUN: <5 mg/dL — ABNORMAL LOW (ref 6–20)
CO2: 26 mmol/L (ref 22–32)
Calcium: 8.1 mg/dL — ABNORMAL LOW (ref 8.9–10.3)
Chloride: 96 mmol/L — ABNORMAL LOW (ref 98–111)
Creatinine, Ser: 0.63 mg/dL (ref 0.61–1.24)
GFR, Estimated: >60 mL/min (ref >60)
Glucose, Bld: 101 mg/dL — ABNORMAL HIGH (ref 70–99)
Potassium: 4.1 mmol/L (ref 3.5–5.1)
Sodium: 131 mmol/L — ABNORMAL LOW (ref 135–145)

## 2023-02-03 LAB — CBC
HCT: 28.7 % — ABNORMAL LOW (ref 39.0–52.0)
Hemoglobin: 9.7 g/dL — ABNORMAL LOW (ref 13.0–17.0)
MCH: 34.3 pg — ABNORMAL HIGH (ref 26.0–34.0)
MCHC: 33.8 g/dL (ref 30.0–36.0)
MCV: 101.4 fL — ABNORMAL HIGH (ref 80.0–100.0)
Platelets: 1032 10*3/uL (ref 150–400)
RBC: 2.83 MIL/uL — ABNORMAL LOW (ref 4.22–5.81)
RDW: 16.6 % — ABNORMAL HIGH (ref 11.5–15.5)
WBC: 24.5 10*3/uL — ABNORMAL HIGH (ref 4.0–10.5)
nRBC: 0.3 % — ABNORMAL HIGH (ref 0.0–0.2)

## 2023-02-03 MED ORDER — IOHEXOL 300 MG/ML  SOLN
100.0000 mL | Freq: Once | INTRAMUSCULAR | Status: AC | PRN
  Administered 2023-02-03: 100 mL via INTRAVENOUS
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
## 2023-02-03 NOTE — Plan of Care (Signed)
Patient alert and oriented. No distress noted. Complaints of a headache, relieved by ordered Tylenol. Will continue to monitor.   Problem: Health Behavior/Discharge Planning: Goal: Ability to manage health-related needs will improve Outcome: Progressing   Problem: Clinical Measurements: Goal: Ability to maintain clinical measurements within normal limits will improve Outcome: Progressing Goal: Will remain free from infection Outcome: Progressing Goal: Diagnostic test results will improve Outcome: Progressing Goal: Respiratory complications will improve Outcome: Progressing Goal: Cardiovascular complication will be avoided Outcome: Progressing   Problem: Activity: Goal: Risk for activity intolerance will decrease Outcome: Progressing   Problem: Nutrition: Goal: Adequate nutrition will be maintained Outcome: Progressing   Problem: Coping: Goal: Level of anxiety will decrease Outcome: Progressing   Problem: Elimination: Goal: Will not experience complications related to bowel motility Outcome: Progressing Goal: Will not experience complications related to urinary retention Outcome: Progressing   Problem: Pain Management: Goal: General experience of comfort will improve Outcome: Progressing   Problem: Safety: Goal: Ability to remain free from injury will improve Outcome: Progressing   Problem: Skin Integrity: Goal: Risk for impaired skin integrity will decrease Outcome: Progressing   Problem: Education: Goal: Ability to describe self-care measures that may prevent or decrease complications (Diabetes Survival Skills Education) will improve Outcome: Progressing Goal: Individualized Educational Video(s) Outcome: Progressing   Problem: Coping: Goal: Ability to adjust to condition or change in health will improve Outcome: Progressing   Problem: Fluid Volume: Goal: Ability to maintain a balanced intake and output will improve Outcome: Progressing   Problem: Health  Behavior/Discharge Planning: Goal: Ability to identify and utilize available resources and services will improve Outcome: Progressing Goal: Ability to manage health-related needs will improve Outcome: Progressing   Problem: Metabolic: Goal: Ability to maintain appropriate glucose levels will improve Outcome: Progressing   Problem: Nutritional: Goal: Maintenance of adequate nutrition will improve Outcome: Progressing Goal: Progress toward achieving an optimal weight will improve Outcome: Progressing   Problem: Skin Integrity: Goal: Risk for impaired skin integrity will decrease Outcome: Progressing   Problem: Tissue Perfusion: Goal: Adequacy of tissue perfusion will improve Outcome: Progressing   Problem: Activity: Goal: Ability to tolerate increased activity will improve Outcome: Progressing   Problem: Respiratory: Goal: Ability to maintain a clear airway and adequate ventilation will improve Outcome: Progressing   Problem: Role Relationship: Goal: Method of communication will improve Outcome: Progressing

## 2023-02-03 NOTE — Plan of Care (Signed)
  Problem: Clinical Measurements: Goal: Will remain free from infection Outcome: Progressing Note: No signs of infection noted. Patient has been afebrile.    Problem: Safety: Goal: Ability to remain free from injury will improve Outcome: Progressing Note: Patient independently ambulate in room.

## 2023-02-03 NOTE — Progress Notes (Signed)
Subjectively, no clinical complaints voiced this morning other than frustrated being here.  Tmax 99 2, vital signs stable. White count slowly coming down, still 24.  Minimal calf tenderness on the right currently.    Have ordered CT of the right fibula with contrast for follow-up looking for any kind of coalescence or drainable collection.  Will be reviewed by vascular in a.m.

## 2023-02-04 ENCOUNTER — Other Ambulatory Visit: Payer: Self-pay | Admitting: Pharmacist

## 2023-02-04 ENCOUNTER — Other Ambulatory Visit: Payer: Self-pay

## 2023-02-04 DIAGNOSIS — B9561 Methicillin susceptible Staphylococcus aureus infection as the cause of diseases classified elsewhere: Secondary | ICD-10-CM | POA: Diagnosis not present

## 2023-02-04 DIAGNOSIS — R7881 Bacteremia: Secondary | ICD-10-CM | POA: Diagnosis not present

## 2023-02-04 DIAGNOSIS — L02415 Cutaneous abscess of right lower limb: Secondary | ICD-10-CM | POA: Diagnosis not present

## 2023-02-04 DIAGNOSIS — S065XAA Traumatic subdural hemorrhage with loss of consciousness status unknown, initial encounter: Secondary | ICD-10-CM | POA: Diagnosis not present

## 2023-02-04 LAB — BASIC METABOLIC PANEL
Anion gap: 11 (ref 5–15)
BUN: <5 mg/dL — ABNORMAL LOW (ref 6–20)
CO2: 22 mmol/L (ref 22–32)
Calcium: 8.1 mg/dL — ABNORMAL LOW (ref 8.9–10.3)
Chloride: 98 mmol/L (ref 98–111)
Creatinine, Ser: 0.65 mg/dL (ref 0.61–1.24)
GFR, Estimated: >60 mL/min (ref >60)
Glucose, Bld: 110 mg/dL — ABNORMAL HIGH (ref 70–99)
Potassium: 4.1 mmol/L (ref 3.5–5.1)
Sodium: 131 mmol/L — ABNORMAL LOW (ref 135–145)

## 2023-02-04 LAB — HEPATIC FUNCTION PANEL
ALT: 18 U/L (ref 0–44)
AST: 19 U/L (ref 15–41)
Albumin: 2.4 g/dL — ABNORMAL LOW (ref 3.5–5.0)
Alkaline Phosphatase: 83 U/L (ref 38–126)
Bilirubin, Direct: <0.1 mg/dL (ref 0.0–0.2)
Total Bilirubin: 0.7 mg/dL (ref <1.2)
Total Protein: 5.8 g/dL — ABNORMAL LOW (ref 6.5–8.1)

## 2023-02-04 LAB — CBC
HCT: 29.1 % — ABNORMAL LOW (ref 39.0–52.0)
Hemoglobin: 9.8 g/dL — ABNORMAL LOW (ref 13.0–17.0)
MCH: 34 pg (ref 26.0–34.0)
MCHC: 33.7 g/dL (ref 30.0–36.0)
MCV: 101 fL — ABNORMAL HIGH (ref 80.0–100.0)
Platelets: 1069 10*3/uL (ref 150–400)
RBC: 2.88 MIL/uL — ABNORMAL LOW (ref 4.22–5.81)
RDW: 17.1 % — ABNORMAL HIGH (ref 11.5–15.5)
WBC: 22.1 10*3/uL — ABNORMAL HIGH (ref 4.0–10.5)
nRBC: 0.2 % (ref 0.0–0.2)

## 2023-02-04 MED ORDER — ACETAMINOPHEN 325 MG PO TABS: 650.0000 mg | ORAL_TABLET | Freq: Four times a day (QID) | ORAL | Status: DC | PRN

## 2023-02-04 MED ORDER — CEPHALEXIN 500 MG PO CAPS
1000.0000 mg | ORAL_CAPSULE | Freq: Once | ORAL | Status: AC
  Administered 2023-02-04: 1000 mg via ORAL
  Filled 2023-02-04: qty 2

## 2023-02-04 MED ORDER — VITAMIN B-1 100 MG PO TABS
100.0000 mg | ORAL_TABLET | Freq: Every day | ORAL | 0 refills | Status: DC
  Filled 2023-02-04: qty 30, 30d supply, fill #0

## 2023-02-04 MED ORDER — ASCORBIC ACID 500 MG PO TABS
500.0000 mg | ORAL_TABLET | Freq: Every day | ORAL | 2 refills | Status: DC
  Filled 2023-02-04: qty 30, 30d supply, fill #0

## 2023-02-04 MED ORDER — VITAMIN D (ERGOCALCIFEROL) 1.25 MG (50000 UNIT) PO CAPS
50000.0000 [IU] | ORAL_CAPSULE | ORAL | 0 refills | Status: DC
  Filled 2023-02-04: qty 12, 84d supply, fill #0

## 2023-02-04 MED ORDER — CEPHALEXIN 500 MG PO CAPS
1000.0000 mg | ORAL_CAPSULE | Freq: Four times a day (QID) | ORAL | 0 refills | Status: AC
  Filled 2023-02-04: qty 152, 19d supply, fill #0

## 2023-02-04 MED ORDER — FOLIC ACID 1 MG PO TABS
1.0000 mg | ORAL_TABLET | Freq: Every day | ORAL | 2 refills | Status: DC
  Filled 2023-02-04: qty 30, 30d supply, fill #0

## 2023-02-04 MED ORDER — METOPROLOL TARTRATE 25 MG PO TABS
12.5000 mg | ORAL_TABLET | Freq: Two times a day (BID) | ORAL | 11 refills | Status: DC
  Filled 2023-02-04: qty 30, 30d supply, fill #0

## 2023-02-04 MED ORDER — ASPIRIN 81 MG PO TBEC
81.0000 mg | DELAYED_RELEASE_TABLET | Freq: Every day | ORAL | 11 refills | Status: DC
  Filled 2023-02-04: qty 30, 30d supply, fill #0

## 2023-02-04 MED ORDER — POLYSACCHARIDE IRON COMPLEX 150 MG PO CAPS
150.0000 mg | ORAL_CAPSULE | Freq: Every day | ORAL | 2 refills | Status: DC
  Filled 2023-02-04: qty 30, 30d supply, fill #0

## 2023-02-04 NOTE — TOC Progression Note (Signed)
Transition of Care [facility]) - Progression Note    Patient Details  Name: Samuel Riley MRN: [id number] Date of Birth: 1970-06-12  Transition of Care [facility]) CM/SW Contact  Truddie Hidden, RN Phone Number: 02/04/2023, 10:13 AM  Clinical Narrative:    TOC continuing to follow patient's progress throughout discharge planning.    Expected Discharge Plan: Home/Self Care Barriers to Discharge: Continued Medical Work up  Expected Discharge Plan and Services                                               Social Determinants of Health (SDOH) Interventions SDOH Screenings   Depression (PHQ2-9): Low Risk  (08/08/2021)  Tobacco Use: High Risk (02/01/2023)    Readmission Risk Interventions     No data to display

## 2023-02-04 NOTE — Progress Notes (Signed)
Progress Note    02/04/2023 11:38 AM 18 Days Post-Op  Subjective:  Samuel Riley is a 52 yo male now S/P craniotomy for subdural hematoma after falling on 01/17/23.  During his hospitalization he developed what appeared to be a thrombophlebitis of the right medial head gastrocnemius muscle.  He had pain and swelling to his right lower extremity.  He underwent an ultrasound which was negative for DVT.  He also underwent a CT of the right lower extremity which showed branching tubular hypodensities of the medial head of the left gastrocnemius muscle with possible thrombosis of the vascular venous system.  Vascular surgery was consulted last week to evaluate.  We recommended the patient follow-up with another CT scan of his right lower extremity on Sunday.  This was completed which showed no worsening at this point.   Vitals:   02/04/23 0852 02/04/23 1126  BP: 136/88 128/83  Pulse: 86 97  Resp: 16 16  Temp: 97.7 F (36.5 C) 98.2 F (36.8 C)  SpO2: 100% 96%   Physical Exam: Cardiac:  RRR, normal S1, S2.  No murmurs appreciated. Lungs: Clear on auscultation throughout, nonlabored breathing without rales rhonchi or wheezing. Incisions: Craniotomy incision.  Healing well Extremities: All extremities with palpable pulses.  Right lower extremity with sagging and atrophied calf muscle.  No appreciation for any abscess or fluid collection.  Right lower extremity is warm to touch.  On auscultation patient states he does not have any pain Abdomen: Positive bowel sounds throughout, soft, nontender nondistended. Neurologic: Alert and oriented x 4, answers all questions and follows commands appropriately.  CBC    Component Value Date/Time   WBC 22.1 (H) 02/04/2023 0540   RBC 2.88 (L) 02/04/2023 0540   HGB 9.8 (L) 02/04/2023 0540   HCT 29.1 (L) 02/04/2023 0540   PLT 1,069 (HH) 02/04/2023 0540   MCV 101.0 (H) 02/04/2023 0540   MCH 34.0 02/04/2023 0540   MCHC 33.7 02/04/2023 0540   RDW 17.1  (H) 02/04/2023 0540   LYMPHSABS 3.5 02/02/2023 0726   MONOABS 2.6 (H) 02/02/2023 0726   EOSABS 0.2 02/02/2023 0726   BASOSABS 0.5 (H) 02/02/2023 0726    BMET    Component Value Date/Time   NA 131 (L) 02/04/2023 0540   K 4.1 02/04/2023 0540   CL 98 02/04/2023 0540   CO2 22 02/04/2023 0540   GLUCOSE 110 (H) 02/04/2023 0540   BUN <5 (L) 02/04/2023 0540   CREATININE 0.65 02/04/2023 0540   CREATININE 0.90 05/05/2021 1511   CALCIUM 8.1 (L) 02/04/2023 0540   GFRNONAA >60 02/04/2023 0540    INR    Component Value Date/Time   INR 1.1 01/20/2023 0529     Intake/Output Summary (Last 24 hours) at 02/04/2023 1138 Last data filed at 02/04/2023 0348 Gross per 24 hour  Intake 1428.8 ml  Output 350 ml  Net 1078.8 ml     Assessment/Plan:  52 y.o. male is s/p craniotomy for subdural hematoma on 01/17/2023.  Upon workup patient found to have questionable abscess/thrombophlebitis to the right gastrocnemius calf muscle.  18 Days Post-Op   Per vascular surgery in reviewing the right lower extremity CT scan there seems to be improvement.  The less edema noted in the surrounding tissues.  Although there are unchanged tubular branching structures within the medial head of the gastric nevus muscle the patient is not verbalizing any difficulties or any pain to his right lower extremity.  Patient actually endorses doing 40 laps around the nurses station this  weekend.  Patient demonstrates he is not having any difficulties as he is doing jumping jacks in the room for me this morning.  Therefore vascular surgery agrees with patient discharge today.  No further intervention is needed.  Patient will be scheduled to follow-up in vein and vascular's clinic on Wednesday, 02/06/2023 for a follow-up ultrasound of his right lower extremity to follow his suspected thrombophlebitis and intramuscular branch venous thrombosis.  Patient verbalizes understanding of what he needs to do on follow-up with vein and  vascular.  I discussed the plan in detail after reviewing the scans with Dr. Levora Dredge MD and he agrees with the plan.   Marcie Bal [facility] 02/04/2023 11:38 AM

## 2023-02-04 NOTE — Discharge Summary (Signed)
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       151.7 lb Date of Birth:  Apr 16, 1970         BSA:          1.817 m Patient Age:    52 years          BP:           013/80 mmHg Patient Gender: M                 HR:           103 bpm. Exam Location:  [facility] Procedure: 2D Echo, Cardiac Doppler and Color Doppler Indications:     Bacteremia  History:         Patient has no prior history of Echocardiogram examinations.                  Signs/Symptoms:Bacteremia; Risk Factors:Hypertension,                  Dyslipidemia and Current Smoker. ETOH abuse.  Sonographer:     Mikki Harbor Referring Phys:  [id number] Sunnie Nielsen Diagnosing Phys: Lorine Bears MD IMPRESSIONS  1. Left ventricular ejection fraction, by estimation, is 65 to 70%. The left ventricle has normal function. The left ventricle has no regional wall motion abnormalities. There is mild left ventricular hypertrophy. Left ventricular diastolic parameters were normal.  2. Right ventricular systolic  function is normal. The right ventricular size is normal. There is normal pulmonary artery systolic pressure.  3. The mitral valve is normal in structure. Mild mitral valve regurgitation. No evidence of mitral stenosis.  4. The aortic valve is normal in structure. Aortic valve regurgitation is not visualized. Aortic valve sclerosis is present, with no evidence of aortic valve stenosis.  5.  The inferior vena cava is normal in size with greater than 50% respiratory variability, suggesting right atrial pressure of 3 mmHg. Conclusion(s)/Recommendation(s): No evidence of valvular vegetations on this transthoracic echocardiogram. FINDINGS  Left Ventricle: Left ventricular ejection fraction, by estimation, is 65 to 70%. The left ventricle has normal function. The left ventricle has no regional wall motion abnormalities. The left ventricular internal cavity size was normal in size. There is  mild left ventricular hypertrophy. Left ventricular diastolic parameters were normal. Right Ventricle: The right ventricular size is normal. No increase in right ventricular wall thickness. Right ventricular systolic function is normal. There is normal pulmonary artery systolic pressure. The tricuspid regurgitant velocity is 1.95 m/s, and  with an assumed right atrial pressure of 3 mmHg, the estimated right ventricular systolic pressure is 18.2 mmHg. Left Atrium: Left atrial size was normal in size. Right Atrium: Right atrial size was normal in size. Pericardium: There is no evidence of pericardial effusion. Mitral Valve: The mitral valve is normal in structure. Mild mitral valve regurgitation. No evidence of mitral valve stenosis. MV peak gradient, 3.9 mmHg. The mean mitral valve gradient is 2.0 mmHg. Tricuspid Valve: The tricuspid valve is normal in structure. Tricuspid valve regurgitation is trivial. No evidence of tricuspid stenosis. Aortic Valve: The aortic valve is normal in structure. Aortic valve regurgitation is not visualized.  Aortic valve sclerosis is present, with no evidence of aortic valve stenosis. Aortic valve mean gradient measures 9.0 mmHg. Aortic valve peak gradient measures 17.6 mmHg. Aortic valve area, by VTI measures 3.04 cm. Pulmonic Valve: The pulmonic valve was normal in structure. Pulmonic valve regurgitation is not visualized. No evidence of pulmonic stenosis. Aorta: The aortic root is normal in size and structure. Venous: The inferior vena cava is normal in size with greater than 50% respiratory variability, suggesting right atrial pressure of 3 mmHg. IAS/Shunts: No atrial level shunt detected by color flow Doppler.  LEFT VENTRICLE PLAX 2D LVIDd:         5.10 cm   Diastology LVIDs:         3.10 cm   LV e' medial:    11.00 cm/s LV PW:         0.90 cm   LV E/e' medial:  7.9 LV IVS:        1.20 cm   LV e' lateral:   13.50 cm/s LVOT diam:     2.00 cm   LV E/e' lateral: 6.4 LV SV:         113 LV SV Index:   62 LVOT Area:     3.14 cm  RIGHT VENTRICLE RV Basal diam:  3.65 cm RV Mid diam:    3.10 cm RV S prime:     20.40 cm/s TAPSE (M-mode): 3.0 cm LEFT ATRIUM             Index        RIGHT ATRIUM           Index LA diam:        3.50 cm 1.93 cm/m   RA Area:     16.00 cm LA Vol (A2C):   66.7 ml 36.70 ml/m  RA Volume:   40.90 ml  22.51 ml/m LA Vol (A4C):   42.3 ml 23.28 ml/m LA Biplane Vol: 58.0 ml 31.92 ml/m  AORTIC VALVE                     PULMONIC VALVE AV Area (Vmax):    2.69  cm      PV Vmax:       1.15 m/s AV Area (Vmean):   3.21 cm      PV Peak grad:  5.3 mmHg AV Area (VTI):     3.04 cm AV Vmax:           210.00 cm/s AV Vmean:          132.000 cm/s AV VTI:            0.372 m AV Peak Grad:      17.6 mmHg AV Mean Grad:      9.0 mmHg LVOT Vmax:         180.00 cm/s LVOT Vmean:        135.000 cm/s LVOT VTI:          0.360 m LVOT/AV VTI ratio: 0.97  AORTA Ao Root diam: 3.70 cm MITRAL VALVE               TRICUSPID VALVE MV Area (PHT): 3.05 cm    TR Peak grad:   15.2 mmHg MV Area VTI:   4.96 cm    TR Vmax:         195.00 cm/s MV Peak grad:  3.9 mmHg MV Mean grad:  2.0 mmHg    SHUNTS MV Vmax:       0.98 m/s    Systemic VTI:  0.36 m MV Vmean:      67.7 cm/s   Systemic Diam: 2.00 cm MV Decel Time: 249 msec MV E velocity: 87.00 cm/s MV A velocity: 69.30 cm/s MV E/A ratio:  1.26 Lorine Bears MD Electronically signed by Lorine Bears MD Signature Date/Time: 01/28/2023/1:00:11 PM    Final    CT HEAD WO CONTRAST ( )  Result Date: 01/27/2023 CLINICAL DATA:  Meningitis/CNS infection suspected. EXAM: CT HEAD WITHOUT CONTRAST TECHNIQUE: Contiguous axial images were obtained from the base of the skull through the vertex without intravenous contrast. RADIATION DOSE REDUCTION: This exam was performed according to the departmental dose-optimization program which includes automated exposure control, adjustment of the mA and/or kV according to patient size and/or use of iterative reconstruction technique. COMPARISON:  Yesterday FINDINGS: Brain: Subdural hematoma with evacuation on the left. Maximal thickness is at the frontal convexity anteriorly, measuring 12 mm. Local cerebral mass effect with midline shift of 7 mm, unchanged. No new hemorrhage. No hydrocephalus, infarct, or mass. Stable pneumocephalus. Vascular: No hyperdense vessel or unexpected calcification. Skull: Unremarkable left-sided craniotomy Sinuses/Orbits: Negative IMPRESSION: Left subdural hematoma with recent evacuation. Unchanged residual blood clot and 7 mm of midline shift. Electronically Signed   By: Tiburcio Pea M.D.   On: 01/27/2023 04:59   CT HEAD WO CONTRAST ( )  Result Date: 01/26/2023 CLINICAL DATA:  Head trauma, moderate to severe. Follow-up craniotomy for subdural hemorrhage. EXAM: CT HEAD WITHOUT CONTRAST TECHNIQUE: Contiguous axial images were obtained from the base of the skull through the vertex without intravenous contrast. RADIATION DOSE REDUCTION: This exam was performed according to the departmental dose-optimization program which includes  automated exposure control, adjustment of the mA and/or kV according to patient size and/or use of iterative reconstruction technique. COMPARISON:  Seven days prior FINDINGS: Brain: Subdural hematoma on the left shows decrease in pneumocephalus. A drain has also been removed. There is residual high-density blood clot measuring up to 11 mm in thickness along the frontal convexity. Midline shift is 7 mm towards the right. No entrapment or infarct. Vascular: No hyperdense vessel or unexpected calcification. Skull: Unremarkable recent craniotomy  site. Sinuses/Orbits: Unremarkable IMPRESSION: Drain removal and resolving postoperative changes at the left subdural hematoma evacuation site. Maximal clot thickness is along the left frontal lobe at 11 mm. Midline shift is similar to prior at 7 mm. Electronically Signed   By: Tiburcio Pea M.D.   On: 01/26/2023 10:19   EEG adult  Result Date: 01/20/2023 Jefferson Fuel, MD     01/20/2023  5:16 PM Routine EEG Report Everado Mackin is a 52 y.o. male with a history of subdural hematoma and altered mental status who is undergoing an EEG to evaluate for seizures. Report: This EEG was acquired with electrodes placed according to the International 10-20 electrode system (including Fp1, Fp2, F3, F4, C3, C4, P3, P4, O1, O2, T3, T4, T5, T6, A1, A2, Fz, Cz, Pz). The following electrodes were missing or displaced: none. The occipital dominant rhythm was 5-6 Hz. This activity is reactive to stimulation. Drowsiness was manifested by background fragmentation; deeper stages of sleep were identified by K complexes and sleep spindles. There was focal slowing over the right hemisphere. There were no interictal epileptiform discharges. There were no electrographic seizures identified. There was no abnormal response to photic stimulation or hyperventilation. Impression and clinical correlation: This EEG was obtained while awake and asleep and is abnormal due to mild diffuse slowing  indicative of global cerebral dysfunction and focal slowing indicating focal cerebral dysfunction over the right hemisphere. Epileptiform abnormalities were not seen during this recording. Bing Neighbors, MD [facility] [phone] If 7pm- 7am, please page neurology on call as listed in AMION.   CT HEAD WO CONTRAST ( )  Result Date: 01/19/2023 CLINICAL DATA:  Narrow deficit, acute, stroke suspected. Previous craniotomy for subdural evacuation. Mental status changes over the last day. EXAM: CT HEAD WITHOUT CONTRAST TECHNIQUE: Contiguous axial images were obtained from the base of the skull through the vertex without intravenous contrast. RADIATION DOSE REDUCTION: This exam was performed according to the departmental dose-optimization program which includes automated exposure control, adjustment of the mA and/or kV according to patient size and/or use of iterative reconstruction technique. COMPARISON:  01/18/2023 FINDINGS: Brain: No posterior fossa finding of significance. Right cerebral hemisphere show some chronic volume loss in the frontal lobe but no other finding. Previous left craniotomy for subdural hematoma evacuation with subdural drain placement. Diminishing amount subdural blood and air on the left. No increased or new bleeding. Mass effect appears similar with left-to-right shift of 7-8 mm. Vascular: No acute vascular finding. Skull: Otherwise negative Sinuses/Orbits: Clear/normal Other: None IMPRESSION: Previous left craniotomy for subdural hematoma evacuation with subdural drain placement. Diminishing amount of subdural blood and air on the left. No increased or new bleeding. Mass effect appears similar with left-to-right shift of 7-8 mm. Electronically Signed   By: Paulina Fusi M.D.   On: 01/19/2023 15:17   CT HEAD WO CONTRAST ( )  Result Date: 01/18/2023 CLINICAL DATA:  Altered mental status, nontraumatic (Ped 0-17y). Head trauma. Left subdural hematoma. Postoperative from  left-sided craniotomy. EXAM: CT HEAD WITHOUT CONTRAST TECHNIQUE: Contiguous axial images were obtained from the base of the skull through the vertex without intravenous contrast. RADIATION DOSE REDUCTION: This exam was performed according to the departmental dose-optimization program which includes automated exposure control, adjustment of the mA and/or kV according to patient size and/or use of iterative reconstruction technique. COMPARISON:  CT head from today. FINDINGS: Brain: No substantial change in size of a left subdural hemorrhage with intermixed gas, measuring approximately 1.1 cm. Left-sided craniotomy with drain in place. Similar  rightward midline shift, approximately 8 mm. Small volume of intraventricular hemorrhage in the right lateral ventricle. No evidence of acute large vascular territory infarct, midline shift or hydrocephalus. Vascular: No hyperdense vessel. Skull: No acute fracture. Left-sided craniotomy with drain in place. Sinuses/Orbits: Mostly clear sinuses.  No acute orbital findings. IMPRESSION: 1. No substantial change in size of a 1.1 cm left subdural hemorrhage with similar 8 mm rightward midline shift 2. Similar small volume of intraventricular hemorrhage. Electronically Signed   By: Feliberto Harts M.D.   On: 01/18/2023 12:03   US Abdomen Limited RUQ (LIVER/GB)  Result Date: 01/18/2023 CLINICAL DATA:  Elevated liver function tests EXAM: ULTRASOUND ABDOMEN LIMITED RIGHT UPPER QUADRANT COMPARISON:  01/18/2021 ultrasound FINDINGS: Gallbladder: Gallbladder is distended. No shadowing stones, wall thickening or adjacent fluid. Common bile duct: Diameter: 5 mm Liver: Diffusely echogenic hepatic parenchyma consistent with fatty liver infiltration. With this level of echogenicity evaluation underlying mass lesion is limited and if needed follow-up contrast CT or MRI as clinically appropriate. Portal vein is patent on color Doppler imaging with normal direction of blood flow towards the  liver. Other: Trace free fluid. IMPRESSION: Distended gallbladder but no stones.  No duct dilatation. Fatty liver infiltration.  Trace abdominal free fluid. Electronically Signed   By: Karen Kays M.D.   On: 01/18/2023 11:55   CT HEAD WO CONTRAST ( )  Result Date: 01/18/2023 CLINICAL DATA:  52 year old male status post fall with head trauma. Left subdural hematoma. Postoperative day 1 left side craniotomy and hematoma evacuation. EXAM: CT HEAD WITHOUT CONTRAST TECHNIQUE: Contiguous axial images were obtained from the base of the skull through the vertex without intravenous contrast. RADIATION DOSE REDUCTION: This exam was performed according to the departmental dose-optimization program which includes automated exposure control, adjustment of the mA and/or kV according to patient size and/or use of iterative reconstruction technique. COMPARISON:  Preoperative CT yesterday. FINDINGS: Brain: Left subdural drain now in place. Decreased left side subdural hematoma. Largely hyperdense residual blood products plus some postoperative pneumocephalus measuring 5-6 mm thickness at most levels, but up to 11 mm along the left anterior frontal convexity (decreased from 16 mm at the same level preoperatively. Small volume of para falcine and left tentorial blood also. Unresolved mass effect on the brain. Rightward midline shift is 7-8 mm now versus 15 mm preoperatively. Ventricle size and configuration not significantly changed. Small volume of layering intraventricular blood now. And trace right hemisphere subarachnoid blood also (sagittal image 24). Possible small hemorrhagic contusion in the contralateral right temporal lobe on coronal image 38. Alternatively this could be trace right hemisphere extra-axial hemorrhage, regardless is stable. Basilar cisterns remain patent. No cortically based acute infarct identified. Vascular: Calcified atherosclerosis at the skull base. No suspicious intracranial vascular  hyperdensity. Skull: New left side craniotomy.  Otherwise stable and intact. Sinuses/Orbits: Scattered paranasal sinus mucosal thickening and polypoid opacity but Visualized paranasal sinuses and mastoids are stable and well aerated. There is an anterior nasal septal defect. Other: Postoperative changes to the scalp now superimposed on anterior forehead scalp hematoma. Ongoing periorbital soft tissue swelling. Globes and intraorbital soft tissues appear to remain intact. IMPRESSION: 1. Decreased left side subdural hematoma on postoperative day 1. Residual hyperdense SDH with some pneumocephalus mostly ranges from 6-11 mm thickness. Small volume intraventricular and right hemisphere subarachnoid hemorrhage now. And tiny right temporal lobe region hemorrhagic contusion versus extra-axial blood, stable. 2. Decreased Rightward midline shift, now 7-8 mm. No ventriculomegaly. Basilar cisterns remain patent. Electronically Signed   By: Rexene Edison  Margo Aye M.D.   On: 01/18/2023 05:44   DG Abd 1 View  Result Date: 01/17/2023 CLINICAL DATA:  Orogastric tube placement. EXAM: ABDOMEN - 1 VIEW COMPARISON:  None Available. FINDINGS: Tip and side port of the enteric tube below the diaphragm in the stomach. Normal bowel gas pattern without obstruction. No radiopaque calculi. IMPRESSION: Tip and side port of the enteric tube below the diaphragm in the stomach. Electronically Signed   By: Narda Rutherford M.D.   On: 01/17/2023 21:33   DG Chest Port 1 View  Result Date: 01/17/2023 CLINICAL DATA:  Leukocytosis. Intubation. Orogastric tube placement. EXAM: PORTABLE CHEST 1 VIEW COMPARISON:  Chest radiograph 10/13/2021 FINDINGS: Endotracheal tube tip 6.4 cm from the carina. Tip and side port of the enteric tube below the diaphragm in the stomach. Mild elevation of right hemidiaphragm. No focal airspace disease. Normal heart size with stable mediastinal contours. Mild aortic atherosclerosis. No pneumothorax, pulmonary edema or large pleural  effusion. Remote left rib fractures. IMPRESSION: 1. Endotracheal tube tip 6.4 cm from the carina. 2. Enteric tube tip and side port below the diaphragm in the stomach. 3. No acute pulmonary process. Electronically Signed   By: Narda Rutherford M.D.   On: 01/17/2023 21:27   CT Head Wo Contrast  Result Date: 01/17/2023 CLINICAL DATA:  Head trauma, skull fracture or hematoma (Age 42-64y) FALL; FALL EXAM: CT HEAD WITHOUT CONTRAST CT CERVICAL SPINE WITHOUT CONTRAST TECHNIQUE: Multidetector CT imaging of the head and cervical spine was performed following the standard protocol without intravenous contrast. Multiplanar CT image reconstructions of the cervical spine were also generated. RADIATION DOSE REDUCTION: This exam was performed according to the departmental dose-optimization program which includes automated exposure control, adjustment of the mA and/or kV according to patient size and/or use of iterative reconstruction technique. COMPARISON:  None Available. FINDINGS: CT HEAD FINDINGS Brain: Acute/hyperdense 1.3 cm thick left cerebral convexity subdural hemorrhage which also tracks along the falx. Significant (1.5 cm) rightward midline shift. No evidence of acute large vascular territory infarct, mass lesion, or hydrocephalus. Effacement of the left lateral ventricle. Vascular: Calcific atherosclerosis. Skull: No acute fracture.  Left posterior scalp Sinuses/Orbits: Clear sinuses.  No acute orbital findings. Other: No mastoid effusions. CT CERVICAL SPINE FINDINGS Significantly motion limited.  Within this limitation: Six Alignment: No definite malalignment within limitation of motion. Skull base and vertebrae: No definite fracture within limitation of motion. Corticated bony fragment at the craniocervical junction appears remote. Soft tissues and spinal canal: No prevertebral fluid or swelling. No visible large canal hematoma. Upper chest: Visualized lung apices are clear. IMPRESSION: 1. Acute large (1.3 cm  thick) left cerebral convexity subdural hemorrhage. 2. Significant (1.5 cm) rightward midline shift. 3. Severely motion limited CT of the cervical spine without obvious acute fracture or traumatic malalignment. Findings discussed with Dr. Modesto Charon via telephone at 3:40 p.m. Electronically Signed   By: Feliberto Harts M.D.   On: 01/17/2023 15:48   CT Cervical Spine Wo Contrast  Result Date: 01/17/2023 CLINICAL DATA:  Head trauma, skull fracture or hematoma (Age 25-64y) FALL; FALL EXAM: CT HEAD WITHOUT CONTRAST CT CERVICAL SPINE WITHOUT CONTRAST TECHNIQUE: Multidetector CT imaging of the head and cervical spine was performed following the standard protocol without intravenous contrast. Multiplanar CT image reconstructions of the cervical spine were also generated. RADIATION DOSE REDUCTION: This exam was performed according to the departmental dose-optimization program which includes automated exposure control, adjustment of the mA and/or kV according to patient size and/or use of iterative reconstruction technique. COMPARISON:  None Available. FINDINGS: CT HEAD FINDINGS Brain: Acute/hyperdense 1.3 cm thick left cerebral convexity subdural hemorrhage which also tracks along the falx. Significant (1.5 cm) rightward midline shift. No evidence of acute large vascular territory infarct, mass lesion, or hydrocephalus. Effacement of the left lateral ventricle. Vascular: Calcific atherosclerosis. Skull: No acute fracture.  Left posterior scalp Sinuses/Orbits: Clear sinuses.  No acute orbital findings. Other: No mastoid effusions. CT CERVICAL SPINE FINDINGS Significantly motion limited.  Within this limitation: Six Alignment: No definite malalignment within limitation of motion. Skull base and vertebrae: No definite fracture within limitation of motion. Corticated bony fragment at the craniocervical junction appears remote. Soft tissues and spinal canal: No prevertebral fluid or swelling. No visible large canal hematoma.  Upper chest: Visualized lung apices are clear. IMPRESSION: 1. Acute large (1.3 cm thick) left cerebral convexity subdural hemorrhage. 2. Significant (1.5 cm) rightward midline shift. 3. Severely motion limited CT of the cervical spine without obvious acute fracture or traumatic malalignment. Findings discussed with Dr. Modesto Charon via telephone at 3:40 p.m. Electronically Signed   By: Feliberto Harts M.D.   On: 01/17/2023 15:48    Microbiology: Recent Results (from the past 240 hour(s))  CSF culture w Gram Stain     Status: None   Collection Time: 01/26/23  9:35 AM   Specimen: CSF; Cerebrospinal Fluid  Result Value Ref Range Status   Specimen Description   Final    CSF Performed at [facility], [address] [place], [place] [address]    Special Requests   Final    NONE Performed at [facility], [address] [place], [place] [address]    Gram Stain   Final    NO ORGANISMS SEEN WBC SEEN RBC SEEN CYTOSPIN SMEAR Performed at [facility], [address] [place], [place] [address]    Culture   Final    NO GROWTH 3 DAYS Performed at [facility], [address], [place], [place] [address]    Report Status 01/29/2023 FINAL  Final  Anaerobic culture w Gram Stain     Status: None   Collection Time: 01/26/23  9:35 AM   Specimen: CSF; Cerebrospinal Fluid  Result Value Ref Range Status   Specimen Description   Final    CSF Performed at [facility], [address] [place], [place] [address]    Special Requests   Final    NONE Performed at [facility], [address] [place], [place] [address]    Gram Stain NO ORGANISMS SEEN WBC SEEN CYTOSPIN SMEAR   Final   Culture   Final    NO ANAEROBES ISOLATED Performed at [facility], [address], [place], [place] [address]    Report Status 01/31/2023 FINAL  Final  Culture, fungus without smear     Status: None (Preliminary result)   Collection Time: 01/26/23  9:36 AM    Specimen: CSF; Cerebrospinal Fluid  Result Value Ref Range Status   Specimen Description   Final    CSF Performed at [facility], [address], [place], [place] [address]    Special Requests   Final    NONE Performed at [facility], [address], [place], [place] [address]    Culture   Final    NO FUNGUS ISOLATED AFTER 8 DAYS Performed at [facility], [address], [place], [place] [address]    Report Status PENDING  Incomplete  Culture, blood (Routine X  2) w Reflex to ID Panel     Status: Abnormal   Collection Time: 01/26/23  9:50 AM   Specimen: BLOOD  Result Value Ref Range Status   Specimen Description   Final    BLOOD RAC Performed at [facility], [address] [place], [place] [address]    Special Requests   Final    BOTTLES DRAWN AEROBIC AND ANAEROBIC Blood Culture results may not be optimal due to an inadequate volume of blood received in culture bottles Performed at [facility], [address] [place], [place] [address]    Culture  Setup Time   Final    GRAM POSITIVE COCCI IN BOTH AEROBIC AND ANAEROBIC BOTTLES STAPHYLOCOCCUS AUREUS CRITICAL RESULT CALLED TO, READ BACK BY AND VERIFIED WITHDawayne Cirri @ 0013 01/27/23 [facility] Performed at [facility], [address] [place], [place] [address]    Culture (A)  Final    STAPHYLOCOCCUS AUREUS SUSCEPTIBILITIES PERFORMED ON PREVIOUS CULTURE WITHIN THE LAST 5 DAYS. Performed at [facility] [facility], [address], [place], [place] [address]    Report Status 01/29/2023 FINAL  Final  Culture, blood (Routine X 2) w Reflex to ID Panel     Status: Abnormal   Collection Time: 01/26/23  9:55 AM   Specimen: BLOOD RIGHT HAND  Result Value Ref Range Status   Specimen Description   Final    BLOOD RIGHT HAND Performed at [facility], [address] [place], [place] [address]    Special Requests   Final    BOTTLES DRAWN AEROBIC AND ANAEROBIC Blood  Culture results may not be optimal due to an inadequate volume of blood received in culture bottles Performed at [facility], [address] [place], [place] [address]    Culture  Setup Time   Final    GRAM POSITIVE COCCI Organism ID to follow IN BOTH AEROBIC AND ANAEROBIC BOTTLES STAPHYLOCOCCUS AUREUS CRITICAL RESULT CALLED TO, READ BACK BY AND VERIFIED WITHDawayne Cirri @ 0013 01/27/23 [facility] Performed at [facility], [address] [place], [place] [address]    Culture STAPHYLOCOCCUS AUREUS (A)  Final   Report Status 01/29/2023 FINAL  Final   Organism ID, Bacteria STAPHYLOCOCCUS AUREUS  Final      Susceptibility   Staphylococcus aureus - MIC*    CIPROFLOXACIN <=0.5 SENSITIVE Sensitive     ERYTHROMYCIN <=0.25 SENSITIVE Sensitive     GENTAMICIN <=0.5 SENSITIVE Sensitive     OXACILLIN 0.5 SENSITIVE Sensitive     TETRACYCLINE <=1 SENSITIVE Sensitive     VANCOMYCIN <=0.5 SENSITIVE Sensitive     TRIMETH/SULFA <=10 SENSITIVE Sensitive     CLINDAMYCIN <=0.25 SENSITIVE Sensitive     RIFAMPIN <=0.5 SENSITIVE Sensitive     Inducible Clindamycin NEGATIVE Sensitive     LINEZOLID 2 SENSITIVE Sensitive     * STAPHYLOCOCCUS AUREUS  Blood Culture ID Panel (Reflexed)     Status: Abnormal   Collection Time: 01/26/23  9:55 AM  Result Value Ref Range Status   Enterococcus faecalis NOT DETECTED NOT DETECTED Final   Enterococcus Faecium NOT DETECTED NOT DETECTED Final   Listeria monocytogenes NOT DETECTED NOT DETECTED Final   Staphylococcus species DETECTED (A) NOT DETECTED Final    Comment: CRITICAL RESULT CALLED TO, READ BACK BY AND VERIFIED WITH: NATHAN BELUE @ 0013 01/27/23 [facility]    Staphylococcus aureus (BCID) DETECTED (A) NOT DETECTED Final    Comment: CRITICAL RESULT CALLED TO, READ BACK BY AND VERIFIED WITH:  NATHAN BELUE @ 0013 01/27/23 [facility]    Staphylococcus epidermidis NOT DETECTED NOT DETECTED Final   Staphylococcus lugdunensis NOT DETECTED NOT DETECTED Final    Streptococcus species NOT DETECTED NOT DETECTED Final   Streptococcus agalactiae NOT DETECTED NOT DETECTED Final   Streptococcus pneumoniae NOT DETECTED NOT DETECTED Final   Streptococcus pyogenes NOT DETECTED NOT DETECTED Final   A.calcoaceticus-baumannii NOT DETECTED NOT DETECTED Final   Bacteroides fragilis NOT DETECTED NOT DETECTED Final   Enterobacterales NOT DETECTED NOT DETECTED Final   Enterobacter cloacae complex NOT DETECTED NOT DETECTED Final   Escherichia coli NOT DETECTED NOT DETECTED Final   Klebsiella aerogenes NOT DETECTED NOT DETECTED Final   Klebsiella oxytoca NOT DETECTED NOT DETECTED Final   Klebsiella pneumoniae NOT DETECTED NOT DETECTED Final   Proteus species NOT DETECTED NOT DETECTED Final   Salmonella species NOT DETECTED NOT DETECTED Final   Serratia marcescens NOT DETECTED NOT DETECTED Final   Haemophilus influenzae NOT DETECTED NOT DETECTED Final   Neisseria meningitidis NOT DETECTED NOT DETECTED Final   Pseudomonas aeruginosa NOT DETECTED NOT DETECTED Final   Stenotrophomonas maltophilia NOT DETECTED NOT DETECTED Final   Candida albicans NOT DETECTED NOT DETECTED Final   Candida auris NOT DETECTED NOT DETECTED Final   Candida glabrata NOT DETECTED NOT DETECTED Final   Candida krusei NOT DETECTED NOT DETECTED Final   Candida parapsilosis NOT DETECTED NOT DETECTED Final   Candida tropicalis NOT DETECTED NOT DETECTED Final   Cryptococcus neoformans/gattii NOT DETECTED NOT DETECTED Final   Meth resistant mecA/C and MREJ NOT DETECTED NOT DETECTED Final    Comment: Performed at [facility], [address] [place], [place] [address]  Culture, blood (Routine X 2) w Reflex to ID Panel     Status: None   Collection Time: 01/28/23 11:32 AM   Specimen: BLOOD  Result Value Ref Range Status   Specimen Description BLOOD LEFT ANTECUBITAL  Final   Special Requests   Final    BOTTLES DRAWN AEROBIC AND ANAEROBIC Blood Culture adequate volume   Culture   Final     NO GROWTH 5 DAYS Performed at [facility], [address] [place], [place] [address]    Report Status 02/02/2023 FINAL  Final  Culture, blood (Routine X 2) w Reflex to ID Panel     Status: None   Collection Time: 01/28/23 11:40 AM   Specimen: BLOOD  Result Value Ref Range Status   Specimen Description BLOOD BLOOD LEFT HAND  Final   Special Requests   Final    BOTTLES DRAWN AEROBIC AND ANAEROBIC Blood Culture adequate volume   Culture   Final    NO GROWTH 5 DAYS Performed at [facility], [address] [place], [place] [address]    Report Status 02/02/2023 FINAL  Final     Labs: CBC: Recent Labs  Lab 01/31/23 0508 02/01/23 0522 02/02/23 0726 02/03/23 0600 02/04/23 0540  WBC 27.6* 30.8* 26.6* 24.5* 22.1*  NEUTROABS  --   --  14.4*  --   --   HGB 9.8* 9.2* 9.5* 9.7* 9.8*  HCT 28.1* 27.2* 28.2* 28.7* 29.1*  MCV 97.2 101.9* 98.6 101.4* 101.0*  PLT 1,074* 1,054* 1,024* 1,032* 1,069*   Basic Metabolic Panel: Recent Labs  Lab 01/29/23 0541 01/30/23 0554 01/31/23 0508 02/01/23 0522 02/02/23 0726 02/03/23 0600 02/04/23 0540  NA 129* 132* 132* 131* 133* 131* 131*  K 2.9* 3.5 3.6 3.5 3.9 4.1 4.1  CL 89* 94* 94* 94* 97* 96*  98  CO2 32 29 28 28 28 26 22   GLUCOSE 100* 157* 106* 117* 113* 101* 110*  BUN <5* <5* <5* <5* <5* <5* <5*  CREATININE 0.65 0.73 0.66 0.68 0.60* 0.63 0.65  CALCIUM 7.6* 7.6* 7.9* 7.7* 7.9* 8.1* 8.1*  MG 2.2 2.3 2.3  --   --   --   --    Liver Function Tests: Recent Labs  Lab 02/04/23 0540  AST 19  ALT 18  ALKPHOS 83  BILITOT 0.7  PROT 5.8*  ALBUMIN 2.4*   No results for input(s): "LIPASE", "AMYLASE" in the last 168 hours. No results for input(s): "AMMONIA" in the last 168 hours. Cardiac Enzymes: No results for input(s): "CKTOTAL", "CKMB", "CKMBINDEX", "TROPONINI" in the last 168 hours. BNP (last 3 results) No results for input(s): "BNP" in the last 8760 hours. CBG: No results for input(s): "GLUCAP" in the last  168 hours.  Time spent: 35 minutes  Signed:  Gillis Santa  [facility] 02/04/2023 12:28 PM

## 2023-02-04 NOTE — Progress Notes (Signed)
Date of Admission:  01/17/2023     ID: Samuel Riley is a 52 y.o. male Principal Problem:   Subdural hematoma (HCC) Active Problems:   Alcohol abuse   Right sided weakness   Traumatic brain compression with herniation, initial encounter (HCC)   Alcoholic intoxication without complication (HCC)   Alcohol use disorder, severe, dependence (HCC)   Pressure injury of skin   Bacteremia due to methicillin susceptible Staphylococcus aureus (MSSA)   Abscess of right lower extremity    Subjective: Doing much better Pain calf resolved      Objective: Vital signs in last 24 hours: Patient Vitals for the past 24 hrs:  BP Temp Temp src Pulse Resp SpO2  02/04/23 1126 128/83 98.2 F (36.8 C) Oral 97 16 96 %  02/04/23 0852 136/88 97.7 F (36.5 C) Oral 86 16 100 %  02/04/23 0347 130/89 98.8 F (37.1 C) Oral 86 18 98 %  02/03/23 2346 132/87 98.9 F (37.2 C) Oral 80 20 99 %  02/03/23 2220 123/83 -- -- 88 -- --      PHYSICAL EXAM:  General: Alert, cooperative, no distress, appears stated age.  Head: surgical site left hemicranium clean , no discharge , no erythema Eyes: Conjunctivae clear, anicteric sclerae. Pupils are equal ENT Nares normal. No drainage or sinus tenderness. Lips, mucosa, and tongue normal. No Thrush Neck: Supple, symmetrical, no adenopathy, thyroid: non tender no carotid bruit and no JVD. Back: No CVA tenderness. Lungs: Clear to auscultation bilaterally. No Wheezing or Rhonchi. No rales. Heart: Regular rate and rhythm, no murmur, rub or gallop. Abdomen: Soft, non-tender,not distended. Bowel sounds normal. No masses Extremities: rt leg- calf area swelling resolved- area of induration( very small)  Skin: No rashes or lesions. Or bruising Scab on his rt foot- no erytthema Lymph: Cervical, supraclavicular normal. Neurologic: Grossly non-focal  Lab Results    Latest Ref Rng & Units 02/04/2023    5:40 AM 02/03/2023    6:00 AM 02/02/2023    7:26 AM  CBC   WBC 4.0 - 10.5 K/uL 22.1  24.5  26.6   Hemoglobin 13.0 - 17.0 g/dL 9.8  9.7  9.5   Hematocrit 39.0 - 52.0 % 29.1  28.7  28.2   Platelets 150 - 400 K/uL 1,069  1,032  1,024        Latest Ref Rng & Units 02/04/2023    5:40 AM 02/03/2023    6:00 AM 02/02/2023    7:26 AM  CMP  Glucose 70 - 99 mg/dL 841  660  630   BUN 6 - 20 mg/dL <5  <5  <5   Creatinine 0.61 - 1.24 mg/dL 1.60  1.09  3.23   Sodium 135 - 145 mmol/L 131  131  133   Potassium 3.5 - 5.1 mmol/L 4.1  4.1  3.9   Chloride 98 - 111 mmol/L 98  96  97   CO2 22 - 32 mmol/L 22  26  28    Calcium 8.9 - 10.3 mg/dL 8.1  8.1  7.9   Total Protein 6.5 - 8.1 g/dL 5.8     Total Bilirubin <1.2 mg/dL 0.7     Alkaline Phos 38 - 126 U/L 83     AST 15 - 41 U/L 19     ALT 0 - 44 U/L 18         Microbiology: [facility] 01/26/23 MSSa 01/28/23- Bc Studies/Results:  CT TIBIA FIBULA RIGHT W CONTRAST  Result Date: 02/03/2023 CLINICAL DATA:  Soft tissue infection suspected. EXAM: CT OF THE LOWER RIGHT EXTREMITY WITH CONTRAST TECHNIQUE: Multidetector CT imaging of the lower right leg was performed according to the standard protocol following intravenous contrast administration. RADIATION DOSE REDUCTION: This exam was performed according to the departmental dose-optimization program which includes automated exposure control, adjustment of the mA and/or kV according to patient size and/or use of iterative reconstruction technique. CONTRAST:  OMNIPAQUE IOHEXOL 300 MG/ML  SOLN COMPARISON:  MRI 01/31/2023. CT 01/30/2023. Bilateral lower extremity Doppler ultrasound 01/29/2023. FINDINGS: Bones/Joint/Cartilage No evidence of acute fracture, dislocation or osteomyelitis. 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 No acute findings at the ankle. Stable osteochondral lesion involving the central aspect of the talar dome. Ligaments Suboptimally  assessed by CT. Muscles and Tendons Unchanged tubular branching structures within the medial head of the gastrocnemius muscle, measuring up to 1.2 x 0.9 cm transverse. No new or enlarging fluid collections are identified. Intramuscular and surrounding subcutaneous edema have slightly improved. The visualized quadriceps and patellar tendons are intact. The visualized ankle tendons are intact without tenosynovitis; their distal insertions are incompletely visualized. Soft tissues Slight improvement in generalized subcutaneous edema within the right lower leg. No new or enlarging fluid collections are identified. No evidence of deep vein thrombosis. IMPRESSION: 1. Slight improvement in generalized subcutaneous edema within the right lower leg. No new or enlarging fluid collections are identified. 2. Unchanged tubular branching structures within the medial head of the gastrocnemius muscle compatible with intramuscular branch venous thrombosis as correlated with prior studies. 3. No evidence of osteomyelitis. 4. 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 Electronically Signed   By: Carey Bullocks M.D.   On: 02/03/2023 11:52     Assessment/Plan: Pt admitted on 01/17/23 with LEft subdural hematoma following fall Encephalopathy due to the above- rt midline shift- s/p evacuation on 01/17/23 ETOH intoxication   Fever on 11/30 and BC MSSA bacteremia with sepsis- resolved   septic thrombophlebitis gastrocnemius rt calf- this could be secondary to the skin wounds he had after the fall Improvd on Nafcillin- vascular /surgery following- no surgical intervention now- imaging in 48 hrs improved Repeat blood culture Neg  2 d echo no valvular vegetation Risk for endocarditis low On day 10 of IV antibiotic- acan change to PO kelfex 1 gram Q6 + dlabavancin weekly X 2 doses    No evidence of meningitis clinically  currently Abnormal CSF - with increased rbc is due to recent surgery/subdural hematoma- csf culture is pending 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

## 2023-02-04 NOTE — Telephone Encounter (Signed)
Currently in ICU

## 2023-02-05 ENCOUNTER — Ambulatory Visit
Admission: RE | Admit: 2023-02-05 | Discharge: 2023-02-05 | Disposition: A | Discharge status: Home / Self Care | Payer: BC Managed Care – PPO | Source: Ambulatory Visit | Attending: Infectious Diseases | Admitting: Infectious Diseases

## 2023-02-05 ENCOUNTER — Other Ambulatory Visit: Payer: Self-pay | Admitting: *Deleted

## 2023-02-05 ENCOUNTER — Telehealth: Payer: Self-pay

## 2023-02-05 DIAGNOSIS — R7881 Bacteremia: Secondary | ICD-10-CM | POA: Diagnosis not present

## 2023-02-05 MED ORDER — DEXTROSE 5 % IV SOLN
1500.0000 mg | Freq: Once | INTRAVENOUS | Status: AC
  Administered 2023-02-05: 1500 mg via INTRAVENOUS
  Filled 2023-02-05: qty 75

## 2023-02-05 NOTE — Transitions of Care (Post Inpatient/ED Visit) (Signed)
02/05/2023  Name: Samuel Riley MRN: [id number] DOB: 1970/10/21  Today's TOC FU Call Status: Today's TOC FU Call Status:: Successful TOC FU Call Completed TOC FU Call Complete Date: 02/05/23 Patient's Name and Date of Birth confirmed.  Transition Care Management Follow-up Telephone Call Date of Discharge: 02/04/23 Discharge Facility: [facility]) Type of Discharge: Inpatient Admission Primary Inpatient Discharge Diagnosis:: subdural hemorrhage How have you been since you were released from the hospital?: Better Any questions or concerns?: No  Items Reviewed: Did you receive and understand the discharge instructions provided?: Yes Medications obtained,verified, and reconciled?: Yes (Medications Reviewed) Any new allergies since your discharge?: No Dietary orders reviewed?: Yes Do you have support at home?: No  Medications Reviewed Today: Medications Reviewed Today     Reviewed by Karena Addison, LPN (Licensed Practical Nurse) on 02/05/23 at [phone]  Med List Status: <None>   Medication Order Taking? Sig Documenting Provider Last Dose Status Informant  acetaminophen (TYLENOL) 325 MG tablet [id number] Yes Take 2 tablets (650 mg total) by mouth every 6 (six) hours as needed for mild pain (pain score 1-3), fever or headache. Gillis Santa, MD Taking Active   ascorbic acid (VITAMIN C) 500 MG tablet [id number] No Take 1 tablet (500 mg total) by mouth daily.  Patient not taking: Reported on 02/05/2023   Gillis Santa, MD Not Taking Active   aspirin EC 81 MG tablet [id number] Yes Take 1 tablet (81 mg total) by mouth daily. Swallow whole. Gillis Santa, MD Taking Active   Calcipotriene-Betameth Diprop (ENSTILAR) 0.005-0.064 % FOAM [id number] Yes Apply to affected areas qd-bid prn, Avoid applying to face, groin, and axilla. Use as directed. Long-term use can cause thinning of the skin. Elie Goody, MD Taking Active   cephALEXin [facility]) 500 MG capsule  [id number] Yes Take 2 capsules (1,000 mg total) by mouth 4 (four) times daily for 19 days. Gillis Santa, MD Taking Active   divalproex (DEPAKOTE) 250 MG DR tablet [id number] Yes 3 tablets in the morning,  2 tablets in the evening Sherlene Shams, MD Taking Active   finasteride (PROPECIA) 1 MG tablet [id number] Yes Take 1 mg by mouth daily. [provider] Taking Active Self  folic acid (FOLVITE) 1 MG tablet [id number]  Take 1 tablet (1 mg total) by mouth daily. Gillis Santa, MD  Active   iron polysaccharides (NIFEREX) 150 MG capsule [id number] No Take 1 capsule (150 mg total) by mouth daily.  Patient not taking: Reported on 02/05/2023   Gillis Santa, MD Not Taking Active   metoprolol tartrate (LOPRESSOR) 25 MG tablet [id number] Yes Take 0.5 tablets (12.5 mg total) by mouth 2 (two) times daily. Gillis Santa, MD Taking Active   omeprazole (PRILOSEC) 20 MG capsule [id number] Yes Take 20 mg by mouth daily. [provider] Taking Active Self  thiamine (VITAMIN B-1) 100 MG tablet [id number] No Take 1 tablet (100 mg total) by mouth daily.  Patient not taking: Reported on 02/05/2023   Gillis Santa, MD Not Taking Active   Vitamin D, Ergocalciferol, (DRISDOL) 1.25 MG (50000 UNIT) CAPS capsule [id number] No Take 1 capsule (50,000 Units total) by mouth every 7 (seven) days.  Patient not taking: Reported on 02/05/2023   Gillis Santa, MD Not Taking Active             Home Care and Equipment/Supplies: Were Home Health Services Ordered?: NA Any new equipment or medical supplies ordered?: NA  Functional Questionnaire: Do you need assistance with bathing/showering or dressing?: No Do you need  assistance with meal preparation?: No Do you need assistance with eating?: No Do you have difficulty maintaining continence: No Do you have difficulty managing or taking your medications?: No  Follow up appointments reviewed: PCP Follow-up appointment confirmed?: Yes Date of PCP follow-up  appointment?: 02/07/23 Follow-up Provider: [facility] Follow-up appointment confirmed?: NA Do you need transportation to your follow-up appointment?: No Do you understand care options if your condition(s) worsen?: Yes-patient verbalized understanding    SIGNATURE Karena Addison, LPN [facility] Nurse Health Advisor Direct Dial [phone]

## 2023-02-06 ENCOUNTER — Other Ambulatory Visit: Payer: Self-pay | Admitting: Internal Medicine

## 2023-02-06 NOTE — Telephone Encounter (Signed)
Not in pt's current medication list.  Last OV: 06/01/2022 Next OV: 02/07/2023

## 2023-02-07 ENCOUNTER — Encounter: Payer: Self-pay | Admitting: Internal Medicine

## 2023-02-07 ENCOUNTER — Telehealth: Payer: Self-pay

## 2023-02-07 ENCOUNTER — Ambulatory Visit: Payer: BC Managed Care – PPO | Admitting: Internal Medicine

## 2023-02-07 ENCOUNTER — Inpatient Hospital Stay: Admission: RE | Admit: 2023-02-07 | Payer: BC Managed Care – PPO | Source: Ambulatory Visit

## 2023-02-07 VITALS — BP 116/78 | HR 91 | Ht 68.0 in | Wt 156.2 lb

## 2023-02-07 DIAGNOSIS — D75839 Thrombocytosis, unspecified: Secondary | ICD-10-CM

## 2023-02-07 DIAGNOSIS — L02415 Cutaneous abscess of right lower limb: Secondary | ICD-10-CM

## 2023-02-07 DIAGNOSIS — E871 Hypo-osmolality and hyponatremia: Secondary | ICD-10-CM

## 2023-02-07 DIAGNOSIS — F1010 Alcohol abuse, uncomplicated: Secondary | ICD-10-CM | POA: Diagnosis not present

## 2023-02-07 DIAGNOSIS — D75838 Other thrombocytosis: Secondary | ICD-10-CM | POA: Insufficient documentation

## 2023-02-07 DIAGNOSIS — F1020 Alcohol dependence, uncomplicated: Secondary | ICD-10-CM | POA: Diagnosis not present

## 2023-02-07 DIAGNOSIS — D62 Acute posthemorrhagic anemia: Secondary | ICD-10-CM | POA: Insufficient documentation

## 2023-02-07 DIAGNOSIS — I82461 Acute embolism and thrombosis of right calf muscular vein: Secondary | ICD-10-CM

## 2023-02-07 DIAGNOSIS — Z79899 Other long term (current) drug therapy: Secondary | ICD-10-CM | POA: Diagnosis not present

## 2023-02-07 DIAGNOSIS — S06A1XA Traumatic brain compression with herniation, initial encounter: Secondary | ICD-10-CM

## 2023-02-07 DIAGNOSIS — B9561 Methicillin susceptible Staphylococcus aureus infection as the cause of diseases classified elsewhere: Secondary | ICD-10-CM

## 2023-02-07 DIAGNOSIS — F901 Attention-deficit hyperactivity disorder, predominantly hyperactive type: Secondary | ICD-10-CM

## 2023-02-07 DIAGNOSIS — I10 Essential (primary) hypertension: Secondary | ICD-10-CM

## 2023-02-07 DIAGNOSIS — Z09 Encounter for follow-up examination after completed treatment for conditions other than malignant neoplasm: Secondary | ICD-10-CM | POA: Insufficient documentation

## 2023-02-07 DIAGNOSIS — S065XAA Traumatic subdural hemorrhage with loss of consciousness status unknown, initial encounter: Secondary | ICD-10-CM

## 2023-02-07 LAB — CBC WITH DIFFERENTIAL/PLATELET
Basophils Absolute: 0.2 10*3/uL — ABNORMAL HIGH (ref 0.0–0.1)
Basophils Relative: 1 % (ref 0.0–3.0)
Eosinophils Absolute: 0.1 10*3/uL (ref 0.0–0.7)
Eosinophils Relative: 0.6 % (ref 0.0–5.0)
HCT: 33.6 % — ABNORMAL LOW (ref 39.0–52.0)
Hemoglobin: 11.2 g/dL — ABNORMAL LOW (ref 13.0–17.0)
Lymphocytes Relative: 12.6 % (ref 12.0–46.0)
Lymphs Abs: 2 10*3/uL (ref 0.7–4.0)
MCHC: 33.3 g/dL (ref 30.0–36.0)
MCV: 104.7 fL — ABNORMAL HIGH (ref 78.0–100.0)
Monocytes Absolute: 1.3 10*3/uL — ABNORMAL HIGH (ref 0.1–1.0)
Monocytes Relative: 8.3 % (ref 3.0–12.0)
Neutro Abs: 12.5 10*3/uL — ABNORMAL HIGH (ref 1.4–7.7)
Neutrophils Relative %: 77.5 % — ABNORMAL HIGH (ref 43.0–77.0)
Platelets: 1094 10*3/uL (ref 150.0–400.0)
RBC: 3.2 Mil/uL — ABNORMAL LOW (ref 4.22–5.81)
RDW: 17.2 % — ABNORMAL HIGH (ref 11.5–15.5)
WBC: 16.1 10*3/uL — ABNORMAL HIGH (ref 4.0–10.5)

## 2023-02-07 LAB — B12 AND FOLATE PANEL
Folate: 19.5 ng/mL (ref >5.9)
Vitamin B-12: 882 pg/mL (ref 211–911)

## 2023-02-07 LAB — COMPREHENSIVE METABOLIC PANEL
ALT: 20 U/L (ref 0–53)
AST: 24 U/L (ref 0–37)
Albumin: 3.5 g/dL (ref 3.5–5.2)
Alkaline Phosphatase: 85 U/L (ref 39–117)
BUN: 11 mg/dL (ref 6–23)
CO2: 28 meq/L (ref 19–32)
Calcium: 9.1 mg/dL (ref 8.4–10.5)
Chloride: 97 meq/L (ref 96–112)
Creatinine, Ser: 0.66 mg/dL (ref 0.40–1.50)
GFR: 106.91 mL/min (ref >60.00)
Glucose, Bld: 110 mg/dL — ABNORMAL HIGH (ref 70–99)
Potassium: 4.8 meq/L (ref 3.5–5.1)
Sodium: 133 meq/L — ABNORMAL LOW (ref 135–145)
Total Bilirubin: 0.3 mg/dL (ref 0.2–1.2)
Total Protein: 7 g/dL (ref 6.0–8.3)

## 2023-02-07 LAB — MAGNESIUM: Magnesium: 2.1 mg/dL (ref 1.5–2.5)

## 2023-02-07 LAB — IBC + FERRITIN
Ferritin: 253.6 ng/mL (ref 22.0–322.0)
Iron: 43 ug/dL (ref 42–165)
Saturation Ratios: 15.3 % — ABNORMAL LOW (ref 20.0–50.0)
TIBC: 281.4 ug/dL (ref 250.0–450.0)
Transferrin: 201 mg/dL — ABNORMAL LOW (ref 212.0–360.0)

## 2023-02-07 MED ORDER — TOPIRAMATE 25 MG PO TABS: 25.0000 mg | ORAL_TABLET | Freq: Every day | ORAL | 0 refills | Status: DC

## 2023-02-07 NOTE — Assessment & Plan Note (Signed)
RESOLVED   HE HAS NOT TAKEN  METOPROLOL SINCE DISCHARGE
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
## 2023-02-07 NOTE — Telephone Encounter (Signed)
Pt gave a verbal understanding and stated that he feels fine.

## 2023-02-07 NOTE — Assessment & Plan Note (Signed)
Attributed to alcoholism and polydipsia.  Repeat level is pending . He is restricting fluids  Lab Results  Component Value Date   NA 131 (L) 02/04/2023   K 4.1 02/04/2023   CL 98 02/04/2023   CO2 22 02/04/2023

## 2023-02-07 NOTE — Telephone Encounter (Signed)
CRITICAL VALUE STICKER  CRITICAL VALUE: Platelet count: [phone]  RECEIVER (on-site recipient of call): Shanda Bumps, CMA  DATE & TIME NOTIFIED: 02/07/2023 @ 1:47pm  MESSENGER (representative from lab): Mariea Clonts  MD NOTIFIED: Duncan Dull, MD  TIME OF NOTIFICATION: 1:47pm  RESPONSE:

## 2023-02-07 NOTE — Assessment & Plan Note (Addendum)
PATIENT HAS REMOTE H/O SPLENECTOMY , BUT PLATELETS WERE NORMAL UNTIL NOV 2024 HOSPITALIZATION.  COUNT CONTINUES TO RISE,  NOW  > 1000k,   REFERRING TO DR ZHOU YU (SHE SAW HIM IN 2019 FOR ERYTHROCYTOSIS WORKUP WHICH WAS NEGATIVE).  CONTINUE ASA   Lab Results  Component Value Date   PLT 1,069 (HH) 02/04/2023    Lab Results  Component Value Date   WBC 22.1 (H) 02/04/2023   HGB 9.8 (L) 02/04/2023   HCT 29.1 (L) 02/04/2023   MCV 101.0 (H) 02/04/2023   PLT 1,069 (HH) 02/04/2023

## 2023-02-07 NOTE — Assessment & Plan Note (Addendum)
WORKUP FOR MENINGITIS AND ENDOCARDITIS NEGATIVE WHILE IN HOUSE .  HE WAS PRESCRIBED CEPHALEXIN AT DISCHARGE AND GIVEN AN  IV INFUSION OF DALBAVANCIN TO BE REPEATED ONE WEE POST D/C.  REPEAT BLOOD CULTURES NEGATIVE DEC 2

## 2023-02-07 NOTE — Assessment & Plan Note (Signed)
Patient was discharged on Dec 9 following admission nov 21 .  All images,  labs,  surgeries reviewed with patient  Vascular surgery follow up advised,  referral made.  Repeat labs to monitor sodium . platelets,  and depakote leve have been ordered
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
## 2023-02-07 NOTE — Assessment & Plan Note (Signed)
SECONDARY TO SDH . HGB WAS NORMAL IN MARCH PRIOR TO ADMISSION .  CHECKING IRON B12 FOLATE FOR  CONTINUED NEED AS HE HAS STOPPED ALL SUPPLEMENTS SINCE DISCHARGE  Lab Results  Component Value Date   WBC 22.1 (H) 02/04/2023   HGB 9.8 (L) 02/04/2023   HCT 29.1 (L) 02/04/2023   MCV 101.0 (H) 02/04/2023   PLT 1,069 (HH) 02/04/2023

## 2023-02-07 NOTE — Assessment & Plan Note (Signed)
He has a sponsor from [employer].  He has had multiple  admissions for relapses  over he past year.  Adding topirimate  given failure of naltrexone to ensure alcohol abstinence and due to increased risk of seizures due to SDH. starting dose 25 mg at bedtime , increase weekly by 25 mg.  Rtc one month

## 2023-02-07 NOTE — Assessment & Plan Note (Signed)
S/p craniotomy Noc 22 for drainage of SDH.  The drain was  removed during hospitalization

## 2023-02-07 NOTE — Assessment & Plan Note (Signed)
Noted on CT .  Unclear if fluid collection is mass or clot.  Anticoagulation not an option given recent SDH.  Referring to Vascular per Gen Surg consult in house.

## 2023-02-07 NOTE — Assessment & Plan Note (Signed)
Taking depalkote since d/c.  No straterra

## 2023-02-07 NOTE — Patient Instructions (Addendum)
I'm starting you on Topiramate instead of naltrexone to help you remain alcohol abstinent  Starting dose is 25 mg at bedtime.  Increase your dose  Each week by 25 mg until you reach 100 mg at week 4.  Let me know if you do not tolerate any dose increase   Your appointments are also listed  I will make the referral to Dr Gilda Crease (Vascular) to  follow up on the issue in  your right calf

## 2023-02-08 LAB — VALPROIC ACID LEVEL: Valproic Acid Lvl: 47.7 mg/L — ABNORMAL LOW (ref 50.0–100.0)

## 2023-02-11 ENCOUNTER — Ambulatory Visit
Admission: RE | Admit: 2023-02-11 | Discharge: 2023-02-11 | Disposition: A | Discharge status: Home / Self Care | Payer: BC Managed Care – PPO | Source: Ambulatory Visit | Attending: Infectious Diseases | Admitting: Infectious Diseases

## 2023-02-11 DIAGNOSIS — L02415 Cutaneous abscess of right lower limb: Secondary | ICD-10-CM

## 2023-02-11 DIAGNOSIS — B9561 Methicillin susceptible Staphylococcus aureus infection as the cause of diseases classified elsewhere: Secondary | ICD-10-CM

## 2023-02-11 MED ORDER — DEXTROSE 5 % IV SOLN
1500.0000 mg | Freq: Once | INTRAVENOUS | Status: DC
  Filled 2023-02-11: qty 75

## 2023-02-11 NOTE — Telephone Encounter (Signed)
CT 12/19 Postop 12/30

## 2023-02-11 NOTE — Progress Notes (Signed)
Patient rescheduled for Tuesday - will call with time.

## 2023-02-12 ENCOUNTER — Ambulatory Visit
Admission: RE | Admit: 2023-02-12 | Discharge: 2023-02-12 | Disposition: A | Discharge status: Home / Self Care | Payer: BC Managed Care – PPO | Source: Ambulatory Visit | Attending: Infectious Diseases | Admitting: Infectious Diseases

## 2023-02-12 ENCOUNTER — Ambulatory Visit: Admission: RE | Admit: 2023-02-12 | Payer: BC Managed Care – PPO | Source: Ambulatory Visit

## 2023-02-12 DIAGNOSIS — R7881 Bacteremia: Secondary | ICD-10-CM | POA: Diagnosis not present

## 2023-02-12 DIAGNOSIS — B9561 Methicillin susceptible Staphylococcus aureus infection as the cause of diseases classified elsewhere: Secondary | ICD-10-CM | POA: Insufficient documentation

## 2023-02-12 DIAGNOSIS — L02415 Cutaneous abscess of right lower limb: Secondary | ICD-10-CM | POA: Insufficient documentation

## 2023-02-12 LAB — CBC WITH DIFFERENTIAL/PLATELET
Abs Immature Granulocytes: 0.17 10*3/uL — ABNORMAL HIGH (ref 0.00–0.07)
Basophils Absolute: 0.2 10*3/uL — ABNORMAL HIGH (ref 0.0–0.1)
Basophils Relative: 2 %
Eosinophils Absolute: 0.6 10*3/uL — ABNORMAL HIGH (ref 0.0–0.5)
Eosinophils Relative: 7 %
HCT: 36.6 % — ABNORMAL LOW (ref 39.0–52.0)
Hemoglobin: 12 g/dL — ABNORMAL LOW (ref 13.0–17.0)
Immature Granulocytes: 2 %
Lymphocytes Relative: 31 %
Lymphs Abs: 2.5 10*3/uL (ref 0.7–4.0)
MCH: 34.3 pg — ABNORMAL HIGH (ref 26.0–34.0)
MCHC: 32.8 g/dL (ref 30.0–36.0)
MCV: 104.6 fL — ABNORMAL HIGH (ref 80.0–100.0)
Monocytes Absolute: 1.2 10*3/uL — ABNORMAL HIGH (ref 0.1–1.0)
Monocytes Relative: 14 %
Neutro Abs: 3.7 10*3/uL (ref 1.7–7.7)
Neutrophils Relative %: 44 %
Platelets: 778 10*3/uL — ABNORMAL HIGH (ref 150–400)
RBC: 3.5 MIL/uL — ABNORMAL LOW (ref 4.22–5.81)
RDW: 16.6 % — ABNORMAL HIGH (ref 11.5–15.5)
WBC: 8.2 10*3/uL (ref 4.0–10.5)
nRBC: 0 % (ref 0.0–0.2)

## 2023-02-12 LAB — COMPREHENSIVE METABOLIC PANEL
ALT: 20 U/L (ref 0–44)
AST: 17 U/L (ref 15–41)
Albumin: 3.5 g/dL (ref 3.5–5.0)
Alkaline Phosphatase: 72 U/L (ref 38–126)
Anion gap: 11 (ref 5–15)
BUN: 7 mg/dL (ref 6–20)
CO2: 27 mmol/L (ref 22–32)
Calcium: 9 mg/dL (ref 8.9–10.3)
Chloride: 100 mmol/L (ref 98–111)
Creatinine, Ser: 0.71 mg/dL (ref 0.61–1.24)
GFR, Estimated: >60 mL/min (ref >60)
Glucose, Bld: 86 mg/dL (ref 70–99)
Potassium: 3.4 mmol/L — ABNORMAL LOW (ref 3.5–5.1)
Sodium: 138 mmol/L (ref 135–145)
Total Bilirubin: 0.5 mg/dL (ref <1.2)
Total Protein: 7.3 g/dL (ref 6.5–8.1)

## 2023-02-12 LAB — SEDIMENTATION RATE: Sed Rate: 47 mm/h — ABNORMAL HIGH (ref 0–20)

## 2023-02-12 LAB — C-REACTIVE PROTEIN: CRP: 0.7 mg/dL (ref <1.0)

## 2023-02-12 MED ORDER — DEXTROSE 5 % IV SOLN
1500.0000 mg | Freq: Once | INTRAVENOUS | Status: AC
  Administered 2023-02-12: 1500 mg via INTRAVENOUS
  Filled 2023-02-12: qty 75

## 2023-02-13 ENCOUNTER — Inpatient Hospital Stay: Payer: BC Managed Care – PPO | Attending: Oncology | Admitting: Oncology

## 2023-02-13 ENCOUNTER — Encounter: Payer: Self-pay | Admitting: Oncology

## 2023-02-13 VITALS — BP 126/90 | HR 91 | Temp 96.7°F | Resp 18 | Wt 158.4 lb

## 2023-02-13 DIAGNOSIS — F17210 Nicotine dependence, cigarettes, uncomplicated: Secondary | ICD-10-CM | POA: Diagnosis not present

## 2023-02-13 DIAGNOSIS — D72821 Monocytosis (symptomatic): Secondary | ICD-10-CM | POA: Diagnosis not present

## 2023-02-13 DIAGNOSIS — F1010 Alcohol abuse, uncomplicated: Secondary | ICD-10-CM | POA: Insufficient documentation

## 2023-02-13 DIAGNOSIS — R7881 Bacteremia: Secondary | ICD-10-CM

## 2023-02-13 DIAGNOSIS — I809 Phlebitis and thrombophlebitis of unspecified site: Secondary | ICD-10-CM | POA: Insufficient documentation

## 2023-02-13 DIAGNOSIS — Z9081 Acquired absence of spleen: Secondary | ICD-10-CM | POA: Diagnosis not present

## 2023-02-13 DIAGNOSIS — D75838 Other thrombocytosis: Secondary | ICD-10-CM | POA: Insufficient documentation

## 2023-02-13 DIAGNOSIS — B9561 Methicillin susceptible Staphylococcus aureus infection as the cause of diseases classified elsewhere: Secondary | ICD-10-CM

## 2023-02-13 NOTE — Assessment & Plan Note (Signed)
Previous peripheral blood  flowcytometry showed A slight absolute monocytosis is present. No significant phenotypic aberrancies of monocytes detected. However, if an absolute monocytosis of 1000 cells/uL or more is present for longer than 3 months, could consider evaluation for chronic myelomonocytic leukemia (CMML). Analysis of the leukocyte population shows: granulocytes 77%, monocytes 7%, lymphocytes 16%, blasts <0.1%, B cells 1%, T cells 14%, NK cells 1%   Recent monocytosis can be due to inflammation. Repeat in 3 months.

## 2023-02-13 NOTE — Assessment & Plan Note (Signed)
Likely contribute to his thrombocytosis. Observation.

## 2023-02-13 NOTE — Assessment & Plan Note (Signed)
Finish course of antibiotics and follow up with ID

## 2023-02-13 NOTE — Assessment & Plan Note (Signed)
He was seen by vascular surgeon during recent admission. Recommend him to follow up and repeat US per recommendation.

## 2023-02-13 NOTE — Assessment & Plan Note (Signed)
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  Patient has had work-up done in the past which showed JAK2 V617F mutation negative, with reflex to other mutations CALR, MPL, JAK 2 Ex 12-15 mutations negative. BCR ABL 1 FISH was negative.  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  This is likely a consequence of previous splenectomy and the acute increase of count is likely due to acute inflammation due to bacteremia, thrombophlebitis and surgery.  I recommend observation I will hold off additional work-up at this point Follow up in 3 months.

## 2023-02-13 NOTE — Progress Notes (Signed)
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 Ventricle: Left ventricular ejection fraction, by estimation, is 65 to 70%. The left ventricle has normal function. The left ventricle has no regional wall motion abnormalities. The  left ventricular internal cavity size was normal in size. There is  mild left ventricular hypertrophy. Left ventricular diastolic parameters were normal. Right Ventricle: The right ventricular size is normal. No increase in right ventricular wall thickness. Right ventricular systolic function is normal. There is normal pulmonary artery systolic pressure. The tricuspid regurgitant velocity is 1.95 m/s, and  with an assumed right atrial pressure of 3 mmHg, the estimated right ventricular systolic pressure is 18.2 mmHg. Left Atrium: Left atrial size was normal in size. Right Atrium: Right atrial size was normal in size. Pericardium: There is no evidence of pericardial effusion. Mitral Valve: The mitral valve is normal in structure. Mild mitral valve regurgitation. No evidence of mitral valve stenosis. MV peak gradient, 3.9  mmHg. The mean mitral valve gradient is 2.0 mmHg. Tricuspid Valve: The tricuspid valve is normal in structure. Tricuspid valve regurgitation is trivial. No evidence of tricuspid stenosis. Aortic Valve: The aortic valve is normal in structure. Aortic valve regurgitation is not visualized. Aortic valve sclerosis is present, with no evidence of aortic valve stenosis. Aortic valve mean gradient measures 9.0 mmHg. Aortic valve peak gradient measures 17.6 mmHg. Aortic valve area, by VTI measures 3.04 cm. Pulmonic Valve: The pulmonic valve was normal in structure. Pulmonic valve regurgitation is not visualized. No evidence of pulmonic stenosis. Aorta: The aortic root is normal in size and structure. Venous: The inferior vena cava is normal in size with greater than 50% respiratory variability, suggesting right atrial pressure of 3 mmHg. IAS/Shunts: No atrial level shunt detected by color flow Doppler.  LEFT VENTRICLE PLAX 2D LVIDd:         5.10 cm   Diastology LVIDs:         3.10 cm   LV e' medial:    11.00 cm/s LV PW:         0.90 cm   LV E/e' medial:  7.9 LV IVS:        1.20 cm   LV e' lateral:   13.50 cm/s LVOT diam:     2.00 cm   LV E/e' lateral: 6.4 LV SV:         113 LV SV Index:   62 LVOT Area:     3.14 cm  RIGHT VENTRICLE RV Basal diam:  3.65 cm RV Mid diam:    3.10 cm RV S prime:     20.40 cm/s TAPSE (M-mode): 3.0 cm LEFT ATRIUM             Index        RIGHT ATRIUM           Index LA diam:        3.50 cm 1.93 cm/m   RA Area:     16.00 cm LA Vol (A2C):   66.7 ml 36.70 ml/m  RA Volume:   40.90 ml  22.51 ml/m LA Vol (A4C):   42.3 ml 23.28 ml/m LA Biplane Vol: 58.0 ml 31.92 ml/m  AORTIC VALVE                     PULMONIC VALVE AV Area (Vmax):    2.69 cm      PV Vmax:       1.15 m/s AV Area (Vmean):   3.21 cm      PV Peak grad:  5.3 mmHg AV Area (VTI):     3.04 cm AV Vmax:           210.00 cm/s AV Vmean:  132.000 cm/s AV VTI:            0.372 m AV Peak Grad:      17.6 mmHg AV Mean Grad:      9.0  mmHg LVOT Vmax:         180.00 cm/s LVOT Vmean:        135.000 cm/s LVOT VTI:          0.360 m LVOT/AV VTI ratio: 0.97  AORTA Ao Root diam: 3.70 cm MITRAL VALVE               TRICUSPID VALVE MV Area (PHT): 3.05 cm    TR Peak grad:   15.2 mmHg MV Area VTI:   4.96 cm    TR Vmax:        195.00 cm/s MV Peak grad:  3.9 mmHg MV Mean grad:  2.0 mmHg    SHUNTS MV Vmax:       0.98 m/s    Systemic VTI:  0.36 m MV Vmean:      67.7 cm/s   Systemic Diam: 2.00 cm MV Decel Time: 249 msec MV E velocity: 87.00 cm/s MV A velocity: 69.30 cm/s MV E/A ratio:  1.26 Lorine Bears MD Electronically signed by Lorine Bears MD Signature Date/Time: 01/28/2023/1:00:11 PM    Final    CT HEAD WO CONTRAST ( ) Result Date: 01/27/2023 CLINICAL DATA:  Meningitis/CNS infection suspected. EXAM: CT HEAD WITHOUT CONTRAST TECHNIQUE: Contiguous axial images were obtained from the base of the skull through the vertex without intravenous contrast. RADIATION DOSE REDUCTION: This exam was performed according to the departmental dose-optimization program which includes automated exposure control, adjustment of the mA and/or kV according to patient size and/or use of iterative reconstruction technique. COMPARISON:  Yesterday FINDINGS: Brain: Subdural hematoma with evacuation on the left. Maximal thickness is at the frontal convexity anteriorly, measuring 12 mm. Local cerebral mass effect with midline shift of 7 mm, unchanged. No new hemorrhage. No hydrocephalus, infarct, or mass. Stable pneumocephalus. Vascular: No hyperdense vessel or unexpected calcification. Skull: Unremarkable left-sided craniotomy Sinuses/Orbits: Negative IMPRESSION: Left subdural hematoma with recent evacuation. Unchanged residual blood clot and 7 mm of midline shift. Electronically Signed   By: Tiburcio Pea M.D.   On: 01/27/2023 04:59   CT HEAD WO CONTRAST ( ) Result Date: 01/26/2023 CLINICAL DATA:  Head trauma, moderate to severe. Follow-up craniotomy for subdural  hemorrhage. EXAM: CT HEAD WITHOUT CONTRAST TECHNIQUE: Contiguous axial images were obtained from the base of the skull through the vertex without intravenous contrast. RADIATION DOSE REDUCTION: This exam was performed according to the departmental dose-optimization program which includes automated exposure control, adjustment of the mA and/or kV according to patient size and/or use of iterative reconstruction technique. COMPARISON:  Seven days prior FINDINGS: Brain: Subdural hematoma on the left shows decrease in pneumocephalus. A drain has also been removed. There is residual high-density blood clot measuring up to 11 mm in thickness along the frontal convexity. Midline shift is 7 mm towards the right. No entrapment or infarct. Vascular: No hyperdense vessel or unexpected calcification. Skull: Unremarkable recent craniotomy site. Sinuses/Orbits: Unremarkable IMPRESSION: Drain removal and resolving postoperative changes at the left subdural hematoma evacuation site. Maximal clot thickness is along the left frontal lobe at 11 mm. Midline shift is similar to prior at 7 mm. Electronically Signed   By: Tiburcio Pea M.D.   On: 01/26/2023 10:19   EEG adult Result Date: 01/20/2023 Jefferson Fuel, MD     01/20/2023  5:16  PM Routine EEG Report Samuel Riley is a 52 y.o. male with a history of subdural hematoma and altered mental status who is undergoing an EEG to evaluate for seizures. Report: This EEG was acquired with electrodes placed according to the International 10-20 electrode system (including Fp1, Fp2, F3, F4, C3, C4, P3, P4, O1, O2, T3, T4, T5, T6, A1, A2, Fz, Cz, Pz). The following electrodes were missing or displaced: none. The occipital dominant rhythm was 5-6 Hz. This activity is reactive to stimulation. Drowsiness was manifested by background fragmentation; deeper stages of sleep were identified by K complexes and sleep spindles. There was focal slowing over the right hemisphere. There were no  interictal epileptiform discharges. There were no electrographic seizures identified. There was no abnormal response to photic stimulation or hyperventilation. Impression and clinical correlation: This EEG was obtained while awake and asleep and is abnormal due to mild diffuse slowing indicative of global cerebral dysfunction and focal slowing indicating focal cerebral dysfunction over the right hemisphere. Epileptiform abnormalities were not seen during this recording. Bing Neighbors, MD [facility] [phone] If 7pm- 7am, please page neurology on call as listed in [facility].   CT HEAD WO CONTRAST ( ) Result Date: 01/19/2023 CLINICAL DATA:  Narrow deficit, acute, stroke suspected. Previous craniotomy for subdural evacuation. Mental status changes over the last day. EXAM: CT HEAD WITHOUT CONTRAST TECHNIQUE: Contiguous axial images were obtained from the base of the skull through the vertex without intravenous contrast. RADIATION DOSE REDUCTION: This exam was performed according to the departmental dose-optimization program which includes automated exposure control, adjustment of the mA and/or kV according to patient size and/or use of iterative reconstruction technique. COMPARISON:  01/18/2023 FINDINGS: Brain: No posterior fossa finding of significance. Right cerebral hemisphere show some chronic volume loss in the frontal lobe but no other finding. Previous left craniotomy for subdural hematoma evacuation with subdural drain placement. Diminishing amount subdural blood and air on the left. No increased or new bleeding. Mass effect appears similar with left-to-right shift of 7-8 mm. Vascular: No acute vascular finding. Skull: Otherwise negative Sinuses/Orbits: Clear/normal Other: None IMPRESSION: Previous left craniotomy for subdural hematoma evacuation with subdural drain placement. Diminishing amount of subdural blood and air on the left. No increased or new bleeding. Mass effect appears similar  with left-to-right shift of 7-8 mm. Electronically Signed   By: Paulina Fusi M.D.   On: 01/19/2023 15:17   CT HEAD WO CONTRAST ( ) Result Date: 01/18/2023 CLINICAL DATA:  Altered mental status, nontraumatic (Ped 0-17y). Head trauma. Left subdural hematoma. Postoperative from left-sided craniotomy. EXAM: CT HEAD WITHOUT CONTRAST TECHNIQUE: Contiguous axial images were obtained from the base of the skull through the vertex without intravenous contrast. RADIATION DOSE REDUCTION: This exam was performed according to the departmental dose-optimization program which includes automated exposure control, adjustment of the mA and/or kV according to patient size and/or use of iterative reconstruction technique. COMPARISON:  CT head from today. FINDINGS: Brain: No substantial change in size of a left subdural hemorrhage with intermixed gas, measuring approximately 1.1 cm. Left-sided craniotomy with drain in place. Similar rightward midline shift, approximately 8 mm. Small volume of intraventricular hemorrhage in the right lateral ventricle. No evidence of acute large vascular territory infarct, midline shift or hydrocephalus. Vascular: No hyperdense vessel. Skull: No acute fracture. Left-sided craniotomy with drain in place. Sinuses/Orbits: Mostly clear sinuses.  No acute orbital findings. IMPRESSION: 1. No substantial change in size of a 1.1 cm left subdural hemorrhage with similar 8 mm rightward midline shift 2.  Similar small volume of intraventricular hemorrhage. Electronically Signed   By: Feliberto Harts M.D.   On: 01/18/2023 12:03   US Abdomen Limited RUQ (LIVER/GB) Result Date: 01/18/2023 CLINICAL DATA:  Elevated liver function tests EXAM: ULTRASOUND ABDOMEN LIMITED RIGHT UPPER QUADRANT COMPARISON:  01/18/2021 ultrasound FINDINGS: Gallbladder: Gallbladder is distended. No shadowing stones, wall thickening or adjacent fluid. Common bile duct: Diameter: 5 mm Liver: Diffusely echogenic hepatic parenchyma  consistent with fatty liver infiltration. With this level of echogenicity evaluation underlying mass lesion is limited and if needed follow-up contrast CT or MRI as clinically appropriate. Portal vein is patent on color Doppler imaging with normal direction of blood flow towards the liver. Other: Trace free fluid. IMPRESSION: Distended gallbladder but no stones.  No duct dilatation. Fatty liver infiltration.  Trace abdominal free fluid. Electronically Signed   By: Karen Kays M.D.   On: 01/18/2023 11:55   CT HEAD WO CONTRAST ( ) Result Date: 01/18/2023 CLINICAL DATA:  52 year old male status post fall with head trauma. Left subdural hematoma. Postoperative day 1 left side craniotomy and hematoma evacuation. EXAM: CT HEAD WITHOUT CONTRAST TECHNIQUE: Contiguous axial images were obtained from the base of the skull through the vertex without intravenous contrast. RADIATION DOSE REDUCTION: This exam was performed according to the departmental dose-optimization program which includes automated exposure control, adjustment of the mA and/or kV according to patient size and/or use of iterative reconstruction technique. COMPARISON:  Preoperative CT yesterday. FINDINGS: Brain: Left subdural drain now in place. Decreased left side subdural hematoma. Largely hyperdense residual blood products plus some postoperative pneumocephalus measuring 5-6 mm thickness at most levels, but up to 11 mm along the left anterior frontal convexity (decreased from 16 mm at the same level preoperatively. Small volume of para falcine and left tentorial blood also. Unresolved mass effect on the brain. Rightward midline shift is 7-8 mm now versus 15 mm preoperatively. Ventricle size and configuration not significantly changed. Small volume of layering intraventricular blood now. And trace right hemisphere subarachnoid blood also (sagittal image 24). Possible small hemorrhagic contusion in the contralateral right temporal lobe on coronal image  38. Alternatively this could be trace right hemisphere extra-axial hemorrhage, regardless is stable. Basilar cisterns remain patent. No cortically based acute infarct identified. Vascular: Calcified atherosclerosis at the skull base. No suspicious intracranial vascular hyperdensity. Skull: New left side craniotomy.  Otherwise stable and intact. Sinuses/Orbits: Scattered paranasal sinus mucosal thickening and polypoid opacity but Visualized paranasal sinuses and mastoids are stable and well aerated. There is an anterior nasal septal defect. Other: Postoperative changes to the scalp now superimposed on anterior forehead scalp hematoma. Ongoing periorbital soft tissue swelling. Globes and intraorbital soft tissues appear to remain intact. IMPRESSION: 1. Decreased left side subdural hematoma on postoperative day 1. Residual hyperdense SDH with some pneumocephalus mostly ranges from 6-11 mm thickness. Small volume intraventricular and right hemisphere subarachnoid hemorrhage now. And tiny right temporal lobe region hemorrhagic contusion versus extra-axial blood, stable. 2. Decreased Rightward midline shift, now 7-8 mm. No ventriculomegaly. Basilar cisterns remain patent. Electronically Signed   By: Odessa Fleming M.D.   On: 01/18/2023 05:44   DG Abd 1 View Result Date: 01/17/2023 CLINICAL DATA:  Orogastric tube placement. EXAM: ABDOMEN - 1 VIEW COMPARISON:  None Available. FINDINGS: Tip and side port of the enteric tube below the diaphragm in the stomach. Normal bowel gas pattern without obstruction. No radiopaque calculi. IMPRESSION: Tip and side port of the enteric tube below the diaphragm in the stomach. Electronically Signed  By: Narda Rutherford M.D.   On: 01/17/2023 21:33   DG Chest Port 1 View Result Date: 01/17/2023 CLINICAL DATA:  Leukocytosis. Intubation. Orogastric tube placement. EXAM: PORTABLE CHEST 1 VIEW COMPARISON:  Chest radiograph 10/13/2021 FINDINGS: Endotracheal tube tip 6.4 cm from the carina. Tip  and side port of the enteric tube below the diaphragm in the stomach. Mild elevation of right hemidiaphragm. No focal airspace disease. Normal heart size with stable mediastinal contours. Mild aortic atherosclerosis. No pneumothorax, pulmonary edema or large pleural effusion. Remote left rib fractures. IMPRESSION: 1. Endotracheal tube tip 6.4 cm from the carina. 2. Enteric tube tip and side port below the diaphragm in the stomach. 3. No acute pulmonary process. Electronically Signed   By: Narda Rutherford M.D.   On: 01/17/2023 21:27   CT Head Wo Contrast Result Date: 01/17/2023 CLINICAL DATA:  Head trauma, skull fracture or hematoma (Age 34-64y) FALL; FALL EXAM: CT HEAD WITHOUT CONTRAST CT CERVICAL SPINE WITHOUT CONTRAST TECHNIQUE: Multidetector CT imaging of the head and cervical spine was performed following the standard protocol without intravenous contrast. Multiplanar CT image reconstructions of the cervical spine were also generated. RADIATION DOSE REDUCTION: This exam was performed according to the departmental dose-optimization program which includes automated exposure control, adjustment of the mA and/or kV according to patient size and/or use of iterative reconstruction technique. COMPARISON:  None Available. FINDINGS: CT HEAD FINDINGS Brain: Acute/hyperdense 1.3 cm thick left cerebral convexity subdural hemorrhage which also tracks along the falx. Significant (1.5 cm) rightward midline shift. No evidence of acute large vascular territory infarct, mass lesion, or hydrocephalus. Effacement of the left lateral ventricle. Vascular: Calcific atherosclerosis. Skull: No acute fracture.  Left posterior scalp Sinuses/Orbits: Clear sinuses.  No acute orbital findings. Other: No mastoid effusions. CT CERVICAL SPINE FINDINGS Significantly motion limited.  Within this limitation: Six Alignment: No definite malalignment within limitation of motion. Skull base and vertebrae: No definite fracture within limitation of  motion. Corticated bony fragment at the craniocervical junction appears remote. Soft tissues and spinal canal: No prevertebral fluid or swelling. No visible large canal hematoma. Upper chest: Visualized lung apices are clear. IMPRESSION: 1. Acute large (1.3 cm thick) left cerebral convexity subdural hemorrhage. 2. Significant (1.5 cm) rightward midline shift. 3. Severely motion limited CT of the cervical spine without obvious acute fracture or traumatic malalignment. Findings discussed with Dr. Modesto Charon via telephone at 3:40 p.m. Electronically Signed   By: Feliberto Harts M.D.   On: 01/17/2023 15:48   CT Cervical Spine Wo Contrast Result Date: 01/17/2023 CLINICAL DATA:  Head trauma, skull fracture or hematoma (Age 80-64y) FALL; FALL EXAM: CT HEAD WITHOUT CONTRAST CT CERVICAL SPINE WITHOUT CONTRAST TECHNIQUE: Multidetector CT imaging of the head and cervical spine was performed following the standard protocol without intravenous contrast. Multiplanar CT image reconstructions of the cervical spine were also generated. RADIATION DOSE REDUCTION: This exam was performed according to the departmental dose-optimization program which includes automated exposure control, adjustment of the mA and/or kV according to patient size and/or use of iterative reconstruction technique. COMPARISON:  None Available. FINDINGS: CT HEAD FINDINGS Brain: Acute/hyperdense 1.3 cm thick left cerebral convexity subdural hemorrhage which also tracks along the falx. Significant (1.5 cm) rightward midline shift. No evidence of acute large vascular territory infarct, mass lesion, or hydrocephalus. Effacement of the left lateral ventricle. Vascular: Calcific atherosclerosis. Skull: No acute fracture.  Left posterior scalp Sinuses/Orbits: Clear sinuses.  No acute orbital findings. Other: No mastoid effusions. CT CERVICAL SPINE FINDINGS Significantly motion limited.  Within this limitation: Six Alignment: No definite malalignment within limitation  of motion. Skull base and vertebrae: No definite fracture within limitation of motion. Corticated bony fragment at the craniocervical junction appears remote. Soft tissues and spinal canal: No prevertebral fluid or swelling. No visible large canal hematoma. Upper chest: Visualized lung apices are clear. IMPRESSION: 1. Acute large (1.3 cm thick) left cerebral convexity subdural hemorrhage. 2. Significant (1.5 cm) rightward midline shift. 3. Severely motion limited CT of the cervical spine without obvious acute fracture or traumatic malalignment. Findings discussed with Dr. Modesto Charon via telephone at 3:40 p.m. Electronically Signed   By: Feliberto Harts M.D.   On: 01/17/2023 15:48

## 2023-02-14 ENCOUNTER — Ambulatory Visit
Admission: RE | Admit: 2023-02-14 | Discharge: 2023-02-14 | Disposition: A | Discharge status: Home / Self Care | Payer: BC Managed Care – PPO | Source: Ambulatory Visit | Attending: Neurosurgery | Admitting: Neurosurgery

## 2023-02-14 DIAGNOSIS — S065XAA Traumatic subdural hemorrhage with loss of consciousness status unknown, initial encounter: Secondary | ICD-10-CM

## 2023-02-14 DIAGNOSIS — I6200 Nontraumatic subdural hemorrhage, unspecified: Secondary | ICD-10-CM | POA: Diagnosis not present

## 2023-02-14 LAB — VITAMIN B1: Vitamin B1 (Thiamine): 21 nmol/L (ref 8–30)

## 2023-02-17 LAB — CULTURE, FUNGUS WITHOUT SMEAR

## 2023-02-21 ENCOUNTER — Encounter: Payer: Self-pay | Admitting: Infectious Diseases

## 2023-02-21 ENCOUNTER — Ambulatory Visit: Payer: BC Managed Care – PPO | Attending: Infectious Diseases | Admitting: Infectious Diseases

## 2023-02-21 VITALS — BP 138/89 | HR 82 | Temp 96.8°F | Ht 68.0 in | Wt 161.0 lb

## 2023-02-21 DIAGNOSIS — F17210 Nicotine dependence, cigarettes, uncomplicated: Secondary | ICD-10-CM | POA: Insufficient documentation

## 2023-02-21 DIAGNOSIS — B9561 Methicillin susceptible Staphylococcus aureus infection as the cause of diseases classified elsewhere: Secondary | ICD-10-CM | POA: Insufficient documentation

## 2023-02-21 DIAGNOSIS — S065XAA Traumatic subdural hemorrhage with loss of consciousness status unknown, initial encounter: Secondary | ICD-10-CM | POA: Diagnosis not present

## 2023-02-21 DIAGNOSIS — I82890 Acute embolism and thrombosis of other specified veins: Secondary | ICD-10-CM | POA: Diagnosis not present

## 2023-02-21 DIAGNOSIS — R7881 Bacteremia: Secondary | ICD-10-CM | POA: Insufficient documentation

## 2023-02-21 DIAGNOSIS — Z9081 Acquired absence of spleen: Secondary | ICD-10-CM | POA: Insufficient documentation

## 2023-02-21 DIAGNOSIS — D75839 Thrombocytosis, unspecified: Secondary | ICD-10-CM | POA: Diagnosis not present

## 2023-02-21 NOTE — Progress Notes (Signed)
NAME: Samuel Riley  DOB: Jan 12, 1971  MRN: [id number]  Date/Time: 02/21/2023 10:01 AM   Subjective:   ? Samuel Riley is a 52 y.o. with a history  of ETOH use,was recently in the hospital after  fall, Subdural hematoma on the left, s/p evacuation Staph aureus bacteremia and right gastrocnemius muscle thrombosis, presents for follow-up. He was hospitalized after a fall, which resulted in a subdural hematoma and multiple skin wounds. During his hospital stay, he developed a fever and blood cultures revealed Staph aureus bacteremia. He also developed a tender, hard area in the right gastrocnemius muscle, which was suspected to be an abscess. However, with continuous IV antibiotics (nafcillin), the area started to decrease in size and did not require surgical intervention.  After 10 days of IV antibiotics, the patient was discharged with oral antibiotics (Keflex), which he is still taking. HE also received 2 weekly dose of Dalbavancin ( 12/10 & 12/17) He reports no current symptoms such as fever, chills, or pain. He also reports that his wounds have healed well.  Past Medical History:  Diagnosis Date   Alcohol abuse    Anxiety    Atypical mole 02/10/2015   Right supra auricular scalp. Mild atypia, deep margin involved.    Basal cell carcinoma 03/14/2006   Mid vertex scalp.    Basal cell carcinoma 03/26/2007   Vertex scalp.    Basal cell carcinoma 01/11/2010   Scalp.    Basal cell carcinoma 11/05/2013   Left anterior deltoid. Superficial. EDC.   Basal cell carcinoma 10/20/2014   Left superior pectoral. Nodular.   Basal cell carcinoma 10/30/2022   Right vertex scalp. Nodular, infiltrative pattern. Mohs pending   Dyslipidemia    Dysplastic nevus 03/06/2017   Right proximal ant. lat. thigh. Mild atypia, limited margins free.   Dysplastic nevus 03/06/2017   Right mid ant. thigh. Mild atypia, limited margins free.   Dysplastic nevus 03/24/2019   Left low back paraspinal. Moderate  atypia, close to margin.   Monocytosis     Past Surgical History:  Procedure Laterality Date   COLONOSCOPY WITH PROPOFOL N/A 03/17/2021   Procedure: COLONOSCOPY WITH PROPOFOL;  Surgeon: Wyline Mood, MD;  Location: [facility] ENDOSCOPY;  Service: Gastroenterology;  Laterality: N/A;   CRANIOTOMY Left 01/17/2023   Procedure: CRANIOTOMY HEMATOMA EVACUATION SUBDURAL;  Surgeon: Lovenia Kim, MD;  Location: [facility] ORS;  Service: Neurosurgery;  Laterality: Left;   SPLENECTOMY, TOTAL N/A 2000   [facility]    Social History   Socioeconomic History   Marital status: Divorced    Spouse name: Not on file   Number of children: 1   Years of education: Not on file   Highest education level: Not on file  Occupational History   Occupation: business owner  Tobacco Use   Smoking status: Every Day    Current packs/day: 0.50    Average packs/day: 0.5 packs/day for 30.0 years (15.0 ttl pk-yrs)    Types: Cigarettes   Smokeless tobacco: Never  Vaping Use   Vaping status: Never Used  Substance and Sexual Activity   Alcohol use: Yes    Comment: Fifth Per Day   Drug use: Not Currently    Types: Cocaine   Sexual activity: Yes    Partners: Female    Birth control/protection: Condom  Other Topics Concern   Not on file  Social History Narrative   Not on file   Social Drivers of Health   Financial Resource Strain: Not on file  Food Insecurity: Not on file  Transportation Needs: Not on file  Physical Activity: Not on file  Stress: Not on file  Social Connections: Not on file  Intimate Partner Violence: Not on file    Family History  Problem Relation Age of Onset   Hypertension Father    Prostate cancer Father    Hypertension Brother    No Known Allergies I? Current Outpatient Medications  Medication Sig Dispense Refill   acetaminophen (TYLENOL) 325 MG tablet Take 2 tablets (650 mg total) by mouth every 6 (six) hours as needed for mild pain (pain score 1-3), fever or headache.     ascorbic acid  (VITAMIN C) 500 MG tablet Take 1 tablet (500 mg total) by mouth daily. 30 tablet 2   aspirin EC 81 MG tablet Take 1 tablet (81 mg total) by mouth daily. Swallow whole. 30 tablet 11   Calcipotriene-Betameth Diprop (ENSTILAR) 0.005-0.064 % FOAM Apply to affected areas qd-bid prn, Avoid applying to face, groin, and axilla. Use as directed. Long-term use can cause thinning of the skin. 60 g 6   cephALEXin (KEFLEX) 500 MG capsule Take 2 capsules (1,000 mg total) by mouth 4 (four) times daily for 19 days. 152 capsule 0   divalproex (DEPAKOTE) 250 MG DR tablet 3 tablets in the morning,  2 tablets in the evening 150 tablet 5   finasteride (PROPECIA) 1 MG tablet Take 1 mg by mouth daily.     omeprazole (PRILOSEC) 20 MG capsule Take 20 mg by mouth daily.     topiramate (TOPAMAX) 25 MG tablet Take 1 tablet (25 mg total) by mouth at bedtime. Increase weekly by 1 tablet to ma xdose of 100 mg 90 tablet 0   No current facility-administered medications for this visit.     Abtx:  Anti-infectives (From admission, onward)    None       REVIEW OF SYSTEMS:  Const: negative fever, negative chills, negative weight loss Eyes: negative diplopia or visual changes, negative eye pain ENT: negative coryza, negative sore throat Resp: negative cough, hemoptysis, dyspnea Cards: negative for chest pain, palpitations, lower extremity edema GU: negative for frequency, dysuria and hematuria GI: Negative for abdominal pain, diarrhea, bleeding, constipation Skin: negative for rash and pruritus Heme: negative for easy bruising and gum/nose bleeding MS: negative for myalgias, arthralgias, back pain and muscle weakness Neurolo:negative for headaches, dizziness, vertigo, memory problems  Psych: negative for feelings of anxiety, depression  Endocrine: negative for thyroid, diabetes Allergy/Immunology- negative for any medication or food allergies ? Objective:  VITALS:  BP 138/89   Pulse 82   Temp (!) 96.8 F (36 C)  (Temporal)   Ht 5\' 8"  (1.727 m)   Wt 161 lb (73 kg)   BMI 24.48 kg/m   PHYSICAL EXAM:  General: Alert, cooperative, no distress, appears stated age. Looks well Head: left scalp curvilinear incision- healed well Eyes: Conjunctivae clear, anicteric sclerae. Pupils are equal ENT Nares normal. No drainage or sinus tenderness. Lips, mucosa, and tongue normal. No Thrush Neck: Supple, symmetrical, no adenopathy, thyroid: non tender no carotid bruit and no JVD. Back: No CVA tenderness. Lungs: Clear to auscultation bilaterally. No Wheezing or Rhonchi. No rales. Heart: Regular rate and rhythm, no murmur, rub or gallop. Abdomen: Soft, non-tender,not distended. Bowel sounds normal. No masses Extremities: atraumatic, no cyanosis. No edema. No clubbing Skin: No rashes or lesions. Or bruising Lymph: Cervical, supraclavicular normal. Neurologic: Grossly non-focal Pertinent Labs Lab Results CBC    Component Value Date/Time   WBC 8.2 02/12/2023 1716  RBC 3.50 (L) 02/12/2023 1716   HGB 12.0 (L) 02/12/2023 1716   HCT 36.6 (L) 02/12/2023 1716   PLT 778 (H) 02/12/2023 1716   MCV 104.6 (H) 02/12/2023 1716   MCH 34.3 (H) 02/12/2023 1716   MCHC 32.8 02/12/2023 1716   RDW 16.6 (H) 02/12/2023 1716   LYMPHSABS 2.5 02/12/2023 1716   MONOABS 1.2 (H) 02/12/2023 1716   EOSABS 0.6 (H) 02/12/2023 1716   BASOSABS 0.2 (H) 02/12/2023 1716       Latest Ref Rng & Units 02/12/2023    5:16 PM 02/07/2023   11:43 AM 02/04/2023    5:40 AM  CMP  Glucose 70 - 99 mg/dL 86  409  811   BUN 6 - 20 mg/dL 7  11  <5   Creatinine 0.61 - 1.24 mg/dL 9.14  7.82  9.56   Sodium 135 - 145 mmol/L 138  133  131   Potassium 3.5 - 5.1 mmol/L 3.4  4.8  4.1   Chloride 98 - 111 mmol/L 100  97  98   CO2 22 - 32 mmol/L 27  28  22    Calcium 8.9 - 10.3 mg/dL 9.0  9.1  8.1   Total Protein 6.5 - 8.1 g/dL 7.3  7.0  5.8   Total Bilirubin <1.2 mg/dL 0.5  0.3  0.7   Alkaline Phos 38 - 126 U/L 72  85  83   AST 15 - 41 U/L 17  24  19     ALT 0 - 44 U/L 20  20  18       IMAGING RESULTS: I have personally reviewed the films ? Impression/Recommendation ?Staphylococcus aureus bacteremia and right gastrocnemius muscle thrombosis Successfully treated with IV Nafcillin and Dalbavancin, currently on oral Keflex. No current signs of infection or abscess formation. -Continue Keflex 500mg  (2 capsules every 6 hours, can reduce to 1 if unable to tolete 2) until completion on December 30th.  Recent blood test leucocytsis has resolved Seen heme onc  H/o splenectomy Was contributing to leucocytosis and thrombocytosis   Subdural hematoma post evacuation No current neurological symptoms reported. -Follow up with neurosurgeon on December 30th.  Alcohol cessation Patient reports complete cessation of alcohol consumption. -Encourage continued abstinence from alcohol.  No further follow-up required with this provider at this time. ? ? ________________________________________________ Discussed with patient,

## 2023-02-21 NOTE — Patient Instructions (Addendum)
you were seen today for a follow-up visit after your recent hospitalization due to a fall, which resulted in a subdural hematoma and multiple skin wounds. During your hospital stay, you developed a Staph aureus bacteremia and a right gastrocnemius muscle thrombosis, both of which have been treated successfully. You are currently on oral antibiotics and report no symptoms such as fever, chills, or pain. Your wounds have healed well.  YOUR PLAN:  -STAPHYLOCOCCUS AUREUS BACTEREMIA AND RIGHT GASTROCNEMIUS MUSCLE THROMBOSIS: Staphylococcus aureus bacteremia is a blood infection caused by the Staph bacteria, and the right gastrocnemius muscle thrombosis is a blood clot in the calf muscle. You were treated with IV antibiotics and are now on oral Keflex. Continue taking Keflex 500mg  (2 capsules every 6 hours) until December 28th. We will check your blood tests today to monitor your progress.  -SUBDURAL HEMATOMA POST EVACUATION: A subdural hematoma is a collection of blood outside the brain, usually caused by a head injury. You had surgery to remove it and currently have no neurological symptoms. Please follow up with your neurosurgeon on December 30th.  -ALCOHOL CESSATION: You have successfully stopped drinking alcohol. Please continue to abstain from alcohol to maintain your health.  INSTRUCTIONS:  Continue taking Keflex 500mg  (2 capsules every 6 hours) until December 28th. Follow up with your neurosurgeon on December 30th.  your blood tests were improved last week and so no new test is needed

## 2023-02-25 ENCOUNTER — Encounter: Payer: Self-pay | Admitting: Neurosurgery

## 2023-02-25 ENCOUNTER — Other Ambulatory Visit: Payer: Self-pay | Admitting: Internal Medicine

## 2023-02-25 ENCOUNTER — Ambulatory Visit (INDEPENDENT_AMBULATORY_CARE_PROVIDER_SITE_OTHER): Payer: BC Managed Care – PPO | Admitting: Neurosurgery

## 2023-02-25 VITALS — BP 126/84 | Ht 68.0 in | Wt 161.0 lb

## 2023-02-25 DIAGNOSIS — Z09 Encounter for follow-up examination after completed treatment for conditions other than malignant neoplasm: Secondary | ICD-10-CM

## 2023-02-25 DIAGNOSIS — G5623 Lesion of ulnar nerve, bilateral upper limbs: Secondary | ICD-10-CM | POA: Insufficient documentation

## 2023-02-25 DIAGNOSIS — S065XAA Traumatic subdural hemorrhage with loss of consciousness status unknown, initial encounter: Secondary | ICD-10-CM

## 2023-02-25 DIAGNOSIS — S065XAD Traumatic subdural hemorrhage with loss of consciousness status unknown, subsequent encounter: Secondary | ICD-10-CM

## 2023-02-25 MED ORDER — CELECOXIB 200 MG PO CAPS: 200.0000 mg | ORAL_CAPSULE | Freq: Two times a day (BID) | ORAL | 1 refills | Status: DC

## 2023-02-25 NOTE — Telephone Encounter (Signed)
Copied from CRM [id number]. Topic: Clinical - Medication Refill >> Feb 25, 2023 11:51 AM Efraim Kaufmann C wrote: Most Recent Primary Care Visit:  Provider: Sherlene Shams  Department: [facility]  Visit Type: HOSPITAL FU  Date: 02/07/2023  Medication: celecoxib (CELEBREX) 200 MG  Has the patient contacted their pharmacy? Yes (Agent: If no, request that the patient contact the pharmacy for the refill. If patient does not wish to contact the pharmacy document the reason why and proceed with request.) (Agent: If yes, when and what did the pharmacy advise?)  Is this the correct pharmacy for this prescription? Yes If no, delete pharmacy and type the correct one.  This is the patient's preferred pharmacy:  [id number] [place], [place] - [address] [place] [place] [address] Phone: [phone] Fax: [phone]     Has the prescription been filled recently? No  Is the patient out of the medication? Yes  Has the patient been seen for an appointment in the last year OR does the patient have an upcoming appointment? Yes  Can we respond through MyChart? Yes  Agent: Please be advised that Rx refills may take up to 3 business days. We ask that you follow-up with your pharmacy.

## 2023-02-25 NOTE — Progress Notes (Signed)
DOS: 01/17/2023  HISTORY OF PRESENT ILLNESS: 02/25/2023  Mr. Samuel Riley is status post left-sided craniotomy for subdural hematoma evacuation.  He had an extended hospitalization due to his level of illness.  He has since been discharged and has been regaining weight and improving his physical function.  He has a known history of bilateral cubital tunnel syndrome, has also had a trigger finger release.  He is ordered his own therapy devices foot to treat his ulnar neuropathy.  PHYSICAL EXAMINATION:   Vitals:   02/25/23 1442  BP: 126/84   General: Patient is well developed, well nourished, calm, collected, and in no apparent distress.  NEUROLOGICAL:  General: In no acute distress.  Awake, alert, oriented to person, place, and time. Pupils equal round and reactive to light.   Strength: He has notable interosseous wasting bilaterally, has a positive Wartenberg's on the left with a positive Froment's sign as well.  Has a worsened Wartenberg's on the right and worsened intrinsic hand weakness on the left.  His median distribution is preserved.  He has a positive Tinel at the cubital tunnel bilaterally.  Right worse than left. His Incision c/d/i   ROS (Neurologic): Negative except as noted above  IMAGING:  Narrative & Impression  CLINICAL DATA:  Subdural hematoma   EXAM: CT HEAD WITHOUT CONTRAST   TECHNIQUE: Contiguous axial images were obtained from the base of the skull through the vertex without intravenous contrast.   RADIATION DOSE REDUCTION: This exam was performed according to the departmental dose-optimization program which includes automated exposure control, adjustment of the mA and/or kV according to patient size and/or use of iterative reconstruction technique.   COMPARISON:  Head CT 01/27/23   FINDINGS: Brain: Evolving left frontal convexity subdural hematoma which now predominantly hypodense. There is a hyperdense3 component abutting the craniotomy  site, which could represent dural thickening or resolving hyperdense blood products. The overall size of the subdural hematoma now measures up to 10 mm, previously 11 mm. There is 5 mm rightward midline shift, previously 8 mm. No mass lesion. No CT evidence of an acute cortical infarct. No hydrocephalus.   Vascular: No hyperdense vessel or unexpected calcification.   Skull: Postsurgical changes from a left pterional craniotomy   Sinuses/Orbits: No middle ear or mastoid effusion. Mucosal thickening bilateral ethmoid right sphenoid sinus. Orbits are unremarkable.   Other: None.   IMPRESSION: Evolving left frontal convexity subdural hematoma, which is now predominantly hypodense in appearance. There is slight interval decrease in size with decreased rightward midline shift, as above.     Electronically Signed   By: Lorenza Cambridge M.D.   On: 02/14/2023 11:20    ASSESSMENT/PLAN:  Samuel Riley is doing well after his craniotomy for subdural hematoma evacuation.  His headaches are better.  He is becoming more aware.  He states that he has been gaining weight back and has been improving his health status after his hospitalization.  He is also complaining of some bilateral ulnar neuropathy right worse than left.  He feels like his right hand is getting progressively worse at a much faster rate.  On physical examination he has interosseous wasting as well as first dorsal webspace wasting he also has a positive Tinel sign at both elbows.  He has been previously diagnosed with cubital tunnel syndrome.  Plan to reach out to his hand surgeon who has treated his Dupuytren's contracture.  I can also offer ulnar nerve decompression and given his severe weakness and wasting would consider a AIN  to ulnar end to side transfer.  He would like to try a home therapy regimen prior to any intervention.   Lovenia Kim, MD/MSCR Department of Neurosurgery

## 2023-03-02 ENCOUNTER — Other Ambulatory Visit: Payer: Self-pay | Admitting: Internal Medicine

## 2023-03-07 ENCOUNTER — Encounter (INDEPENDENT_AMBULATORY_CARE_PROVIDER_SITE_OTHER): Payer: BC Managed Care – PPO | Admitting: Vascular Surgery

## 2023-03-12 ENCOUNTER — Ambulatory Visit: Payer: BC Managed Care – PPO | Admitting: Internal Medicine

## 2023-03-19 DIAGNOSIS — C4441 Basal cell carcinoma of skin of scalp and neck: Secondary | ICD-10-CM | POA: Diagnosis not present

## 2023-03-22 ENCOUNTER — Other Ambulatory Visit: Payer: Self-pay | Admitting: Internal Medicine

## 2023-03-25 ENCOUNTER — Telehealth: Payer: Self-pay

## 2023-03-25 NOTE — Telephone Encounter (Signed)
MOHs completed to right vertex scalp 03/19/23.  Specimen tracking and history updated. aw

## 2023-03-27 ENCOUNTER — Encounter: Payer: Self-pay | Admitting: Internal Medicine

## 2023-03-27 ENCOUNTER — Ambulatory Visit: Payer: BC Managed Care – PPO | Admitting: Internal Medicine

## 2023-03-27 VITALS — BP 120/80 | HR 79 | Ht 68.0 in | Wt 167.2 lb

## 2023-03-27 DIAGNOSIS — R531 Weakness: Secondary | ICD-10-CM

## 2023-03-27 DIAGNOSIS — D62 Acute posthemorrhagic anemia: Secondary | ICD-10-CM | POA: Diagnosis not present

## 2023-03-27 DIAGNOSIS — Z125 Encounter for screening for malignant neoplasm of prostate: Secondary | ICD-10-CM | POA: Diagnosis not present

## 2023-03-27 DIAGNOSIS — S065XAA Traumatic subdural hemorrhage with loss of consciousness status unknown, initial encounter: Secondary | ICD-10-CM

## 2023-03-27 DIAGNOSIS — G5623 Lesion of ulnar nerve, bilateral upper limbs: Secondary | ICD-10-CM

## 2023-03-27 DIAGNOSIS — E871 Hypo-osmolality and hyponatremia: Secondary | ICD-10-CM

## 2023-03-27 DIAGNOSIS — F1020 Alcohol dependence, uncomplicated: Secondary | ICD-10-CM

## 2023-03-27 MED ORDER — TOPIRAMATE 100 MG PO TABS: 100.0000 mg | ORAL_TABLET | Freq: Every day | ORAL | 5 refills | Status: DC

## 2023-03-27 NOTE — Assessment & Plan Note (Signed)
Right hand remains weak due to cubital tunnel syndrome but otherwise his right sided weakness has resolved

## 2023-03-27 NOTE — Assessment & Plan Note (Signed)
S/p craniotomy Dec 2024.  Secondary to trauma ,.  Size of residual fluid decreasing per serial imaging.  Continue Neurosurgery follow up

## 2023-03-27 NOTE — Assessment & Plan Note (Signed)
RESOLVED ,   HE HAS NOT TAKEN  METOPROLOL SINCE DISCHARGE

## 2023-03-27 NOTE — Assessment & Plan Note (Signed)
Resolved, Attributed to alcoholism and polydipsia.  Lab Results  Component Value Date   NA 139 03/27/2023   K 4.3 03/27/2023   CL 103 03/27/2023   CO2 28 03/27/2023

## 2023-03-27 NOTE — Progress Notes (Unsigned)
Subjective:  Patient ID: Samuel Riley, male    DOB: 29-Mar-1970  Age: 53 y.o. MRN: [id number]  CC: The primary encounter diagnosis was Anemia due to blood loss, acute. Diagnoses of Hypo-osmolar hyponatremia, Cubital tunnel syndrome, bilateral, Prostate cancer screening, Subdural hematoma (HCC), and Right sided weakness were also pertinent to this visit.   HPI Samuel Riley presents for  Chief Complaint  Patient presents with   Medical Management of Chronic Issues    1 month follow up    1) subdural hematoma,  traumatic    repeat imaging showing regeression of hypodense collection per neurosurgery follow up visit,  feeling better, gaining weight.   Has resumed physical activities: skiing.   2)  bilateral cubital tunnel syndrome:  he has notable weakness  of right hand with  interosseous wasting .Marland Kitchen  Neurosurgeon offered him  transpoisition surgery , which was was deferred by patient  who chose  to follow up with his hand surgeon and pursue PT ,  has been doing PT on his own,  strength improving "minimally "  on the right,   but feeling has improved   (his worse hand)  needs to see [place] at [facility]   3) went skiing last weekend at [facility].  Knees sore   5) alcohol abuse;  has remained abstinent since  discharge.  Admits he had 3  close calls.   Using topamax  now at 100 mg at bedtime refills needed.  Gaining weight .  No results found for: "PSA1", "PSA"     Outpatient Medications Prior to Visit  Medication Sig Dispense Refill   Calcipotriene-Betameth Diprop (ENSTILAR) 0.005-0.064 % FOAM Apply to affected areas qd-bid prn, Avoid applying to face, groin, and axilla. Use as directed. Long-term use can cause thinning of the skin. 60 g 6   celecoxib (CELEBREX) 200 MG capsule Take 1 capsule (200 mg total) by mouth 2 (two) times daily. 60 capsule 1   divalproex (DEPAKOTE) 250 MG DR tablet TAKE 3 TABLETS IN THE MORNING, 2 TABLETS IN THE EVENING 450 tablet 1   finasteride  (PROPECIA) 1 MG tablet Take 1 mg by mouth daily.     omeprazole (PRILOSEC) 20 MG capsule Take 20 mg by mouth daily.     topiramate (TOPAMAX) 25 MG tablet TAKE 1 TABLET (25 MG TOTAL) BY MOUTH AT BEDTIME. INCREASE WEEKLY BY 1 TABLET TO MA XDOSE OF 100 MG 90 tablet 0   acetaminophen (TYLENOL) 325 MG tablet Take 2 tablets (650 mg total) by mouth every 6 (six) hours as needed for mild pain (pain score 1-3), fever or headache. (Patient not taking: Reported on 03/27/2023)     ascorbic acid (VITAMIN C) 500 MG tablet Take 1 tablet (500 mg total) by mouth daily. (Patient not taking: Reported on 03/27/2023) 30 tablet 2   aspirin EC 81 MG tablet Take 1 tablet (81 mg total) by mouth daily. Swallow whole. (Patient not taking: Reported on 03/27/2023) 30 tablet 11   No facility-administered medications prior to visit.    Review of Systems;  Patient denies headache, fevers, malaise, unintentional weight loss, skin rash, eye pain, sinus congestion and sinus pain, sore throat, dysphagia,  hemoptysis , cough, dyspnea, wheezing, chest pain, palpitations, orthopnea, edema, abdominal pain, nausea, melena, diarrhea, constipation, flank pain, dysuria, hematuria, urinary  Frequency, nocturia, numbness, tingling, seizures,  Focal weakness, Loss of consciousness,  Tremor, insomnia, depression, anxiety, and suicidal ideation.      Objective:  BP 120/80   Pulse 79  Ht 5\' 8"  (1.727 m)   Wt 167 lb 3.2 oz (75.8 kg)   SpO2 97%   BMI 25.42 kg/m   BP Readings from Last 3 Encounters:  03/27/23 120/80  02/25/23 126/84  02/21/23 138/89    Wt Readings from Last 3 Encounters:  03/27/23 167 lb 3.2 oz (75.8 kg)  02/25/23 161 lb (73 kg)  02/21/23 161 lb (73 kg)    Physical Exam  Lab Results  Component Value Date   HGBA1C 5.7 (H) 01/18/2023   HGBA1C 5.2 06/10/2017   HGBA1C 5.5 05/21/2016    Lab Results  Component Value Date   CREATININE 0.71 02/12/2023   CREATININE 0.66 02/07/2023   CREATININE 0.65 02/04/2023     Lab Results  Component Value Date   WBC 8.2 02/12/2023   HGB 12.0 (L) 02/12/2023   HCT 36.6 (L) 02/12/2023   PLT 778 (H) 02/12/2023   GLUCOSE 86 02/12/2023   CHOL 176 12/28/2020   TRIG 424 (H) 01/18/2023   HDL 33.40 (L) 12/28/2020   LDLDIRECT 82.0 12/28/2020   LDLCALC 148 (H) 05/18/2016   ALT 20 02/12/2023   AST 17 02/12/2023   NA 138 02/12/2023   K 3.4 (L) 02/12/2023   CL 100 02/12/2023   CREATININE 0.71 02/12/2023   BUN 7 02/12/2023   CO2 27 02/12/2023   TSH 0.95 05/05/2021   INR 1.1 01/20/2023   HGBA1C 5.7 (H) 01/18/2023    CT HEAD WO CONTRAST ( ) Result Date: 02/14/2023 CLINICAL DATA:  Subdural hematoma EXAM: CT HEAD WITHOUT CONTRAST TECHNIQUE: Contiguous axial images were obtained from the base of the skull through the vertex without intravenous contrast. RADIATION DOSE REDUCTION: This exam was performed according to the departmental dose-optimization program which includes automated exposure control, adjustment of the mA and/or kV according to patient size and/or use of iterative reconstruction technique. COMPARISON:  Head CT 01/27/23 FINDINGS: Brain: Evolving left frontal convexity subdural hematoma which now predominantly hypodense. There is a hyperdense3 component abutting the craniotomy site, which could represent dural thickening or resolving hyperdense blood products. The overall size of the subdural hematoma now measures up to 10 mm, previously 11 mm. There is 5 mm rightward midline shift, previously 8 mm. No mass lesion. No CT evidence of an acute cortical infarct. No hydrocephalus. Vascular: No hyperdense vessel or unexpected calcification. Skull: Postsurgical changes from a left pterional craniotomy Sinuses/Orbits: No middle ear or mastoid effusion. Mucosal thickening bilateral ethmoid right sphenoid sinus. Orbits are unremarkable. Other: None. IMPRESSION: Evolving left frontal convexity subdural hematoma, which is now predominantly hypodense in appearance. There is  slight interval decrease in size with decreased rightward midline shift, as above. Electronically Signed   By: Lorenza Cambridge M.D.   On: 02/14/2023 11:20    Assessment & Plan:  .Anemia due to blood loss, acute -     CBC with Differential/Platelet  Hypo-osmolar hyponatremia Assessment & Plan: Resolved, Attributed to alcoholism and polydipsia.  Repeat level is pending .  Lab Results  Component Value Date   NA 138 02/12/2023   K 3.4 (L) 02/12/2023   CL 100 02/12/2023   CO2 27 02/12/2023     Orders: -     Comprehensive metabolic panel  Cubital tunnel syndrome, bilateral -     Ambulatory referral to Hand Surgery  Prostate cancer screening -     PSA  Subdural hematoma [facility]) Assessment & Plan: S/p craniotomy Dec 2024.  Secondary to trauma ,.  Size of residual fluid decreasing per Neurosurgery follow up  Right sided weakness Assessment & Plan: Right hand remains weak due to cubital tunnel syndrome but otherwise his right sided weakness has resolved    Other orders -     Topiramate; Take 1 tablet (100 mg total) by mouth at bedtime. Increase weekly by 1 tablet to ma xdose of 100 mg  Dispense: 30 tablet; Refill: 5     I provided 30 minutes of face-to-face time during this encounter reviewing patient's last visit with me, patient's  most recent visit with cardiology,  nephrology,  and neurology,  recent surgical and non surgical procedures, previous  labs and imaging studies, counseling on currently addressed issues,  and post visit ordering to diagnostics and therapeutics .   Follow-up: No follow-ups on file.   Sherlene Shams, MD

## 2023-03-28 LAB — CBC WITH DIFFERENTIAL/PLATELET
Basophils Absolute: 0.1 10*3/uL (ref 0.0–0.1)
Basophils Relative: 1.3 % (ref 0.0–3.0)
Eosinophils Absolute: 0.2 10*3/uL (ref 0.0–0.7)
Eosinophils Relative: 1.7 % (ref 0.0–5.0)
HCT: 46.4 % (ref 39.0–52.0)
Hemoglobin: 15.5 g/dL (ref 13.0–17.0)
Lymphocytes Relative: 29.7 % (ref 12.0–46.0)
Lymphs Abs: 3 10*3/uL (ref 0.7–4.0)
MCHC: 33.4 g/dL (ref 30.0–36.0)
MCV: 101.9 fL — ABNORMAL HIGH (ref 78.0–100.0)
Monocytes Absolute: 1 10*3/uL (ref 0.1–1.0)
Monocytes Relative: 9.9 % (ref 3.0–12.0)
Neutro Abs: 5.9 10*3/uL (ref 1.4–7.7)
Neutrophils Relative %: 57.4 % (ref 43.0–77.0)
Platelets: 423 10*3/uL — ABNORMAL HIGH (ref 150.0–400.0)
RBC: 4.55 Mil/uL (ref 4.22–5.81)
RDW: 14.5 % (ref 11.5–15.5)
WBC: 10.2 10*3/uL (ref 4.0–10.5)

## 2023-03-28 LAB — COMPREHENSIVE METABOLIC PANEL
ALT: 15 U/L (ref 0–53)
AST: 15 U/L (ref 0–37)
Albumin: 4.3 g/dL (ref 3.5–5.2)
Alkaline Phosphatase: 49 U/L (ref 39–117)
BUN: 15 mg/dL (ref 6–23)
CO2: 28 meq/L (ref 19–32)
Calcium: 9.6 mg/dL (ref 8.4–10.5)
Chloride: 103 meq/L (ref 96–112)
Creatinine, Ser: 1.02 mg/dL (ref 0.40–1.50)
GFR: 83.69 mL/min (ref >60.00)
Glucose, Bld: 91 mg/dL (ref 70–99)
Potassium: 4.3 meq/L (ref 3.5–5.1)
Sodium: 139 meq/L (ref 135–145)
Total Bilirubin: 0.3 mg/dL (ref 0.2–1.2)
Total Protein: 6.7 g/dL (ref 6.0–8.3)

## 2023-03-28 LAB — PSA: PSA: 0.5 ng/mL (ref 0.10–4.00)

## 2023-03-28 NOTE — Assessment & Plan Note (Signed)
He has developed thenar muscle wasting and has been deferring surgical management.  Referral to hand surgeon

## 2023-03-28 NOTE — Assessment & Plan Note (Signed)
SECONDARY TO [facility] . HGB has normalized   Lab Results  Component Value Date   WBC 10.2 03/27/2023   HGB 15.5 03/27/2023   HCT 46.4 03/27/2023   MCV 101.9 (H) 03/27/2023   PLT 423.0 (H) 03/27/2023

## 2023-03-28 NOTE — Assessment & Plan Note (Signed)
He has a sponsor from [employer].  He has had multiple  admissions for relapses  over he past year.  Tolerating  topirimate  chosen due to  failure of naltrexone to ensure alcohol abstinence and due to increased risk of seizures due to SDH.

## 2023-03-30 ENCOUNTER — Encounter: Payer: Self-pay | Admitting: Internal Medicine

## 2023-03-31 NOTE — Progress Notes (Unsigned)
MRN : [id number]  Samuel Riley is a 53 y.o. (Jan 14, 1971) male who presents with chief complaint of legs hurt and swell.  History of Present Illness:  The patient presented to [facility] on January 17, 2023 with findings of a left craniotomy for evacuation of a subdural hematoma secondary to a fall.  We were consulted on February 01, 2023 for the possibility of a septic thrombophlebitis within his right gastroc veins.  The patient had been bacteremic with Staph aureus.  He had begun complaining of pain in his right calf posteriorly and by palpation a "knot" could be appreciated.  A duplex ultrasound was obtained which was negative for DVT negative for superficial thrombophlebitis.  However a CT scan was obtained which described:  Tubular branching structures within the medial head of the gastrocnemius muscle compatible with intramuscular branch venous thrombosis.  Yet, the duplex ultrasound of the venous system was normal with the exception of some noted edema of the soft tissues.  I did not believe this was a septic thrombophlebitis and I elected to proceed with observation.  He was subsequently discharged February 04, 2023.  He returns today with total resolution of his calf symptoms.  He denies any pain he denies any palpable "knots".  He denies any edema or tenderness.  The patient has not been using compression therapy at this point.  No SOB or pleuritic chest pains.  No cough or hemoptysis.  No blood per rectum or blood in any sputum.  No excessive bruising per the patient.   No recent shortening of the patient's walking distance or new symptoms consistent with claudication.  No history of rest pain symptoms. No new ulcers or wounds of the lower extremities have occurred.  The patient denies amaurosis fugax or recent TIA symptoms. There are no recent neurological changes noted. No recent episodes of angina or shortness of breath documented.   No  outpatient medications have been marked as taking for the 04/01/23 encounter (Appointment) with Gilda Crease, Latina Craver, MD.    Past Medical History:  Diagnosis Date   Alcohol abuse    Anxiety    Atypical mole 02/10/2015   Right supra auricular scalp. Mild atypia, deep margin involved.    Basal cell carcinoma 03/14/2006   Mid vertex scalp.    Basal cell carcinoma 03/26/2007   Vertex scalp.    Basal cell carcinoma 01/11/2010   Scalp.    Basal cell carcinoma 11/05/2013   Left anterior deltoid. Superficial. EDC.   Basal cell carcinoma 10/20/2014   Left superior pectoral. Nodular.   Basal cell carcinoma 10/30/2022   Right vertex scalp. Nodular, infiltrative pattern. Mohs 03/19/2023   Dyslipidemia    Dysplastic nevus 03/06/2017   Right proximal ant. lat. thigh. Mild atypia, limited margins free.   Dysplastic nevus 03/06/2017   Right mid ant. thigh. Mild atypia, limited margins free.   Dysplastic nevus 03/24/2019   Left low back paraspinal. Moderate atypia, close to margin.   Monocytosis     Past Surgical History:  Procedure Laterality Date   COLONOSCOPY WITH PROPOFOL N/A 03/17/2021   Procedure: COLONOSCOPY WITH PROPOFOL;  Surgeon: Wyline Mood, MD;  Location: [facility] ENDOSCOPY;  Service: Gastroenterology;  Laterality: N/A;   CRANIOTOMY Left 01/17/2023   Procedure: CRANIOTOMY HEMATOMA EVACUATION SUBDURAL;  Surgeon: Lovenia Kim, MD;  Location: [facility] ORS;  Service: Neurosurgery;  Laterality: Left;   SPLENECTOMY, TOTAL N/A 2000   [facility]    Social History Social History  Tobacco Use   Smoking status: Every Day    Current packs/day: 0.50    Average packs/day: 0.5 packs/day for 30.0 years (15.0 ttl pk-yrs)    Types: Cigarettes   Smokeless tobacco: Never  Vaping Use   Vaping status: Never Used  Substance Use Topics   Alcohol use: Yes    Comment: Fifth Per Day   Drug use: Not Currently    Types: Cocaine    Family History Family History  Problem Relation Age of Onset    Hypertension Father    Prostate cancer Father    Hypertension Brother     No Known Allergies   REVIEW OF SYSTEMS (Negative unless checked)  Constitutional: [] Weight loss  [] Fever  [] Chills Cardiac: [] Chest pain   [] Chest pressure   [] Palpitations   [] Shortness of breath when laying flat   [] Shortness of breath with exertion. Vascular:  [] Pain in legs with walking   [x] Pain in legs at rest  [] History of DVT   [] Phlebitis   [x] Swelling in legs   [] Varicose veins   [] Non-healing ulcers Pulmonary:   [] Uses home oxygen   [] Productive cough   [] Hemoptysis   [] Wheeze  [] COPD   [] Asthma Neurologic:  [] Dizziness   [] Seizures   [] History of stroke   [] History of TIA  [] Aphasia   [] Vissual changes   [] Weakness or numbness in arm   [] Weakness or numbness in leg Musculoskeletal:   [] Joint swelling   [] Joint pain   [] Low back pain Hematologic:  [] Easy bruising  [] Easy bleeding   [] Hypercoagulable state   [] Anemic Gastrointestinal:  [] Diarrhea   [] Vomiting  [] Gastroesophageal reflux/heartburn   [] Difficulty swallowing. Genitourinary:  [] Chronic kidney disease   [] Difficult urination  [] Frequent urination   [] Blood in urine Skin:  [] Rashes   [] Ulcers  Psychological:  [] History of anxiety   []  History of major depression.  Physical Examination  There were no vitals filed for this visit. There is no height or weight on file to calculate BMI. Gen: WD/WN, NAD Head: [place]/AT, No temporalis wasting.  Ear/Nose/Throat: Hearing grossly intact, nares w/o erythema or drainage, pinna without lesions Eyes: PER, EOMI, sclera nonicteric.  Neck: Supple, no gross masses.  No JVD.  Pulmonary:  Good air movement, no audible wheezing, no use of accessory muscles.  Cardiac: RRR, precordium not hyperdynamic. Vascular:  scattered varicosities present bilaterally.  Moderate venous stasis changes to the legs bilaterally.  Trace  soft pitting edema. CEAP [id number] the right calf is completely normal to palpation no palpable  cords nontender no abnormalities Vessel Right Left  Radial Palpable Palpable  Gastrointestinal: soft, non-distended. No guarding/no peritoneal signs.  Musculoskeletal: M/S 5/5 throughout.  No deformity.  Neurologic: CN 2-12 intact. Pain and light touch intact in extremities.  Symmetrical.  Speech is fluent. Motor exam as listed above. Psychiatric: Judgment intact, Mood & affect appropriate for pt's clinical situation. Dermatologic: Venous rashes no ulcers noted.  No changes consistent with cellulitis. Lymph : No lichenification or skin changes of chronic lymphedema.  CBC Lab Results  Component Value Date   WBC 10.2 03/27/2023   HGB 15.5 03/27/2023   HCT 46.4 03/27/2023   MCV 101.9 (H) 03/27/2023   PLT 423.0 (H) 03/27/2023    BMET    Component Value Date/Time   NA 139 03/27/2023 1434   K 4.3 03/27/2023 1434   CL 103 03/27/2023 1434   CO2 28 03/27/2023 1434   GLUCOSE 91 03/27/2023 1434   BUN 15 03/27/2023 1434   CREATININE 1.02 03/27/2023 1434  CREATININE 0.90 05/05/2021 1511   CALCIUM 9.6 03/27/2023 1434   GFRNONAA >60 02/12/2023 1716   Estimated Creatinine Clearance: 81 mL/min (by C-G formula based on SCr of 1.02 mg/dL).  COAG Lab Results  Component Value Date   INR 1.1 01/20/2023   INR 1.0 01/19/2023   INR 1.2 01/18/2023    Radiology No results found.   Assessment/Plan 1. Pain and swelling of right lower leg (Primary) Recommend:  No surgery or intervention at this point in time.  The patient has had complete resolution of his calf symptoms and his examination is normal.  I have reviewed my discussion with the patient regarding venous insufficiency and why it causes symptoms. I have discussed with the patient the chronic skin changes that accompany venous insufficiency and the long term sequela such as ulceration. Patient will contnue wearing graduated compression stockings on a daily basis, as this has provided excellent control of his edema. The patient will  put the stockings on first thing in the morning and removing them in the evening. The patient is reminded not to sleep in the stockings.  In addition, behavioral modification including elevation during the day will be initiated. Exercise is strongly encouraged.  Given the patient's good control and lack of any problems regarding the venous insufficiency and lymphedema a lymph pump in not need at this time.    The patient will follow up with me PRN should anything change.  The patient voices agreement with this plan.  2. Osteoarthritis of both knees, unspecified osteoarthritis type Continue medications to treat the patient's degenerative disease as already ordered, these medications have been reviewed and there are no changes at this time.  Continued activity and therapy was stressed.  3. Subdural hematoma (HCC) Plan per neurosurgery  4. Primary hypertension Continue antihypertensive medications as already ordered, these medications have been reviewed and there are no changes at this time.    Levora Dredge, MD  03/31/2023 3:57 PM

## 2023-04-01 ENCOUNTER — Encounter (INDEPENDENT_AMBULATORY_CARE_PROVIDER_SITE_OTHER): Payer: Self-pay | Admitting: Vascular Surgery

## 2023-04-01 ENCOUNTER — Ambulatory Visit (INDEPENDENT_AMBULATORY_CARE_PROVIDER_SITE_OTHER): Payer: BC Managed Care – PPO | Admitting: Vascular Surgery

## 2023-04-01 VITALS — BP 126/84 | HR 71 | Resp 16 | Wt 169.0 lb

## 2023-04-01 DIAGNOSIS — M79661 Pain in right lower leg: Secondary | ICD-10-CM

## 2023-04-01 DIAGNOSIS — I10 Essential (primary) hypertension: Secondary | ICD-10-CM | POA: Diagnosis not present

## 2023-04-01 DIAGNOSIS — M7989 Other specified soft tissue disorders: Secondary | ICD-10-CM

## 2023-04-01 DIAGNOSIS — M170 Bilateral primary osteoarthritis of knee: Secondary | ICD-10-CM | POA: Diagnosis not present

## 2023-04-01 DIAGNOSIS — S065XAA Traumatic subdural hemorrhage with loss of consciousness status unknown, initial encounter: Secondary | ICD-10-CM

## 2023-04-02 ENCOUNTER — Encounter (INDEPENDENT_AMBULATORY_CARE_PROVIDER_SITE_OTHER): Payer: Self-pay | Admitting: Vascular Surgery

## 2023-04-02 DIAGNOSIS — M79661 Pain in right lower leg: Secondary | ICD-10-CM | POA: Insufficient documentation

## 2023-04-02 DIAGNOSIS — G5623 Lesion of ulnar nerve, bilateral upper limbs: Secondary | ICD-10-CM | POA: Diagnosis not present

## 2023-04-02 HISTORY — DX: Pain in right lower leg: M79.661

## 2023-04-04 ENCOUNTER — Other Ambulatory Visit: Payer: Self-pay | Admitting: Internal Medicine

## 2023-04-25 ENCOUNTER — Other Ambulatory Visit: Payer: Self-pay | Admitting: Internal Medicine

## 2023-04-25 DIAGNOSIS — Z01818 Encounter for other preprocedural examination: Secondary | ICD-10-CM | POA: Diagnosis not present

## 2023-04-30 ENCOUNTER — Ambulatory Visit: Payer: BC Managed Care – PPO | Admitting: Dermatology

## 2023-05-01 DIAGNOSIS — G8918 Other acute postprocedural pain: Secondary | ICD-10-CM | POA: Diagnosis not present

## 2023-05-01 DIAGNOSIS — G5621 Lesion of ulnar nerve, right upper limb: Secondary | ICD-10-CM | POA: Diagnosis not present

## 2023-05-14 ENCOUNTER — Inpatient Hospital Stay: Payer: BC Managed Care – PPO | Admitting: Oncology

## 2023-05-14 ENCOUNTER — Encounter: Payer: Self-pay | Admitting: Oncology

## 2023-05-14 ENCOUNTER — Inpatient Hospital Stay: Payer: BC Managed Care – PPO | Attending: Oncology

## 2023-05-14 VITALS — BP 137/95 | HR 60 | Temp 97.4°F | Resp 18 | Wt 170.2 lb

## 2023-05-14 DIAGNOSIS — F17210 Nicotine dependence, cigarettes, uncomplicated: Secondary | ICD-10-CM | POA: Insufficient documentation

## 2023-05-14 DIAGNOSIS — Z9081 Acquired absence of spleen: Secondary | ICD-10-CM | POA: Insufficient documentation

## 2023-05-14 DIAGNOSIS — D75838 Other thrombocytosis: Secondary | ICD-10-CM | POA: Diagnosis not present

## 2023-05-14 DIAGNOSIS — D72821 Monocytosis (symptomatic): Secondary | ICD-10-CM

## 2023-05-14 LAB — CBC WITH DIFFERENTIAL (CANCER CENTER ONLY)
Abs Immature Granulocytes: 0.1 10*3/uL — ABNORMAL HIGH (ref 0.00–0.07)
Basophils Absolute: 0.1 10*3/uL (ref 0.0–0.1)
Basophils Relative: 1 %
Eosinophils Absolute: 0.1 10*3/uL (ref 0.0–0.5)
Eosinophils Relative: 1 %
HCT: 48.1 % (ref 39.0–52.0)
Hemoglobin: 16.6 g/dL (ref 13.0–17.0)
Immature Granulocytes: 1 %
Lymphocytes Relative: 29 %
Lymphs Abs: 2.9 10*3/uL (ref 0.7–4.0)
MCH: 33.1 pg (ref 26.0–34.0)
MCHC: 34.5 g/dL (ref 30.0–36.0)
MCV: 95.8 fL (ref 80.0–100.0)
Monocytes Absolute: 0.8 10*3/uL (ref 0.1–1.0)
Monocytes Relative: 8 %
Neutro Abs: 6.1 10*3/uL (ref 1.7–7.7)
Neutrophils Relative %: 60 %
Platelet Count: 394 10*3/uL (ref 150–400)
RBC: 5.02 MIL/uL (ref 4.22–5.81)
RDW: 13 % (ref 11.5–15.5)
WBC Count: 10 10*3/uL (ref 4.0–10.5)
nRBC: 0 % (ref 0.0–0.2)

## 2023-05-14 NOTE — Assessment & Plan Note (Addendum)
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  Patient has had work-up done in the past which showed JAK2 V617F mutation negative, with reflex to other mutations CALR, MPL, JAK 2 Ex 12-15 mutations negative. BCR ABL 1 FISH was negative.  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  Previous thrombocytosis is likely a consequence of previous splenectomy and the acute increase of count is likely due to acute inflammation due to bacteremia, thrombophlebitis and surgery.  Labs are reviewed and discussed with patient. Platelet count has normalized.
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
## 2023-06-23 ENCOUNTER — Other Ambulatory Visit: Payer: Self-pay | Admitting: Internal Medicine

## 2023-07-16 ENCOUNTER — Encounter (INDEPENDENT_AMBULATORY_CARE_PROVIDER_SITE_OTHER): Payer: Self-pay

## 2023-08-10 IMAGING — US US ABDOMEN LIMITED
1 series · 14 of 25 positions shown · non-contrast
Comparison: None.

CLINICAL DATA: Abnormal liver function tests

EXAM:
ULTRASOUND ABDOMEN LIMITED RIGHT UPPER QUADRANT

[Series 1: us abdomen limited ruq (liver/gb) · 14 of 46 slices shown]
[im 1/46]
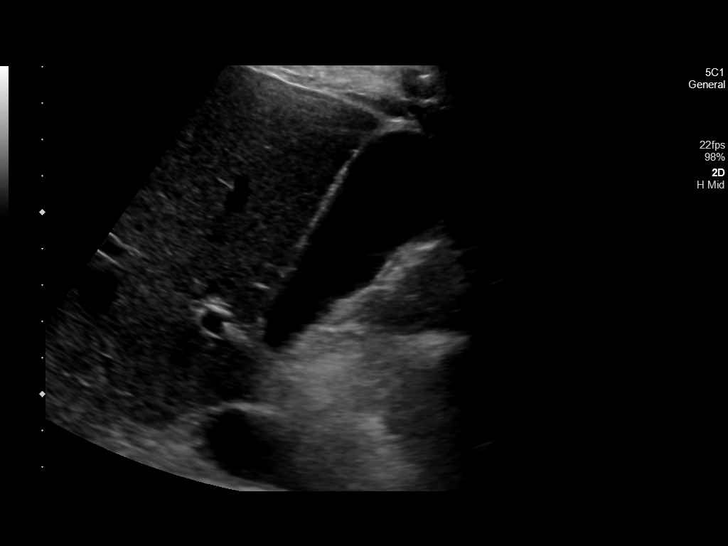
[im 4/46]
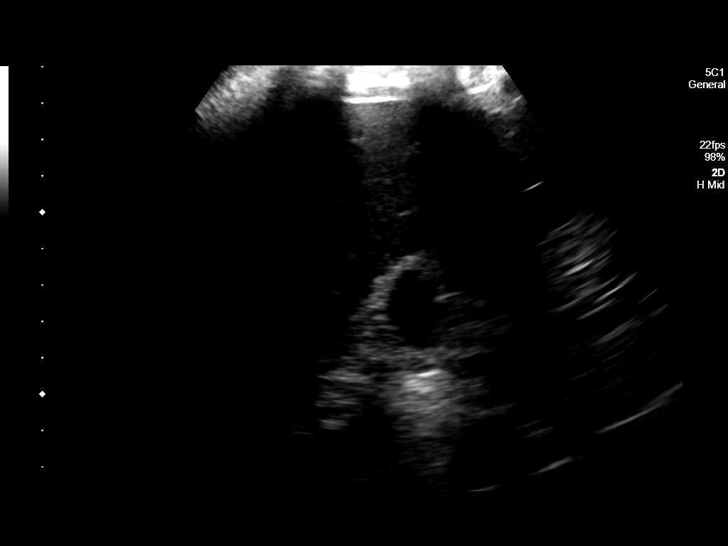
[im 8/46]
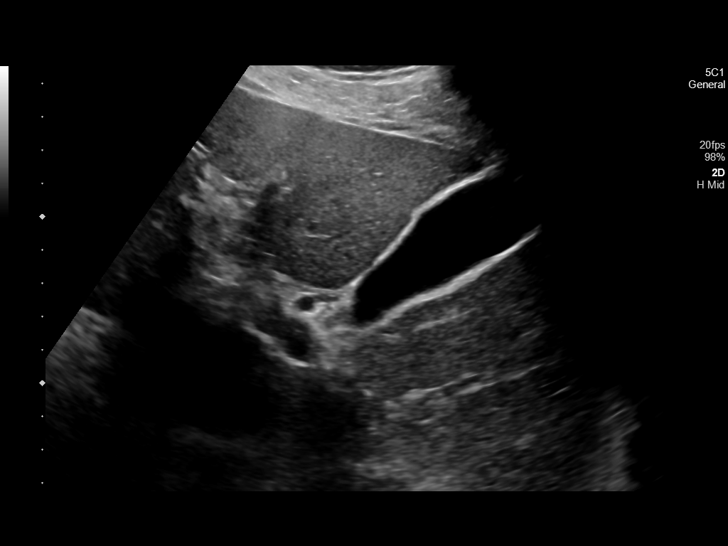
[im 12/46]
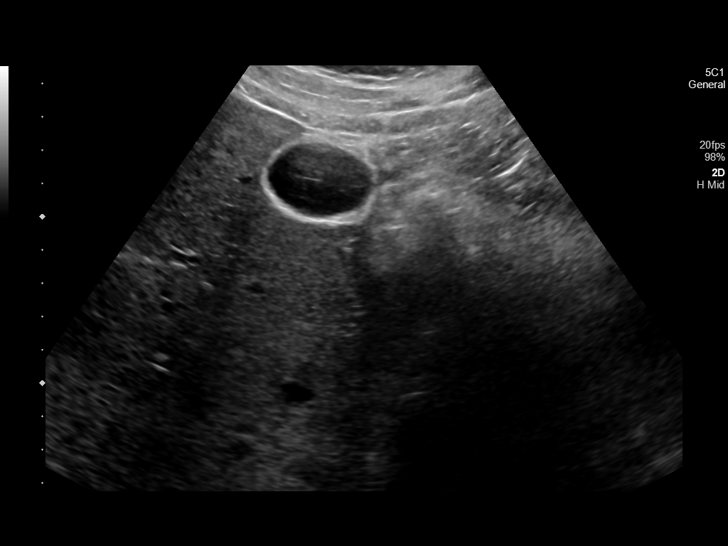
[im 16/46]
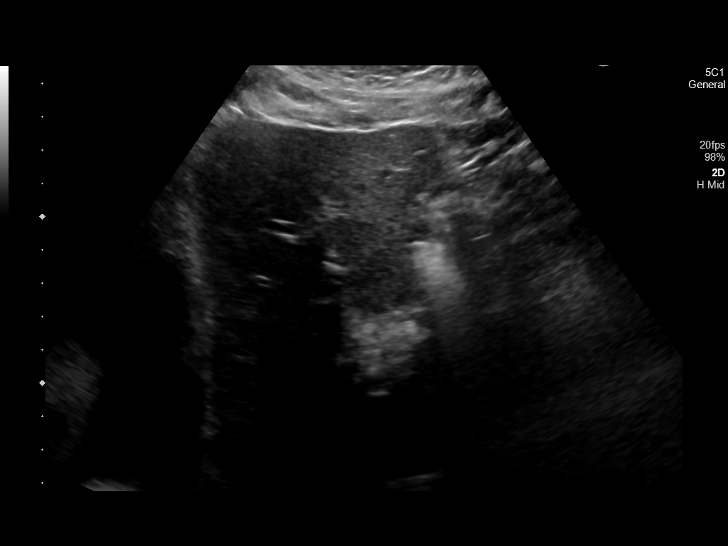
[im 17/46]
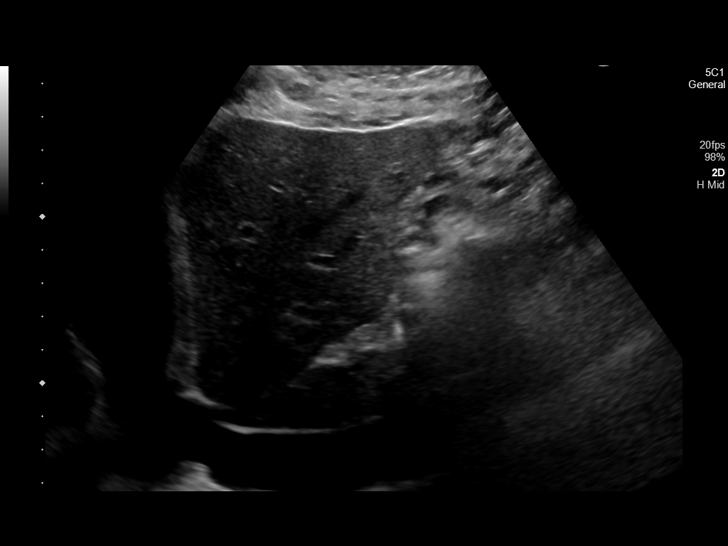
[im 21/46]
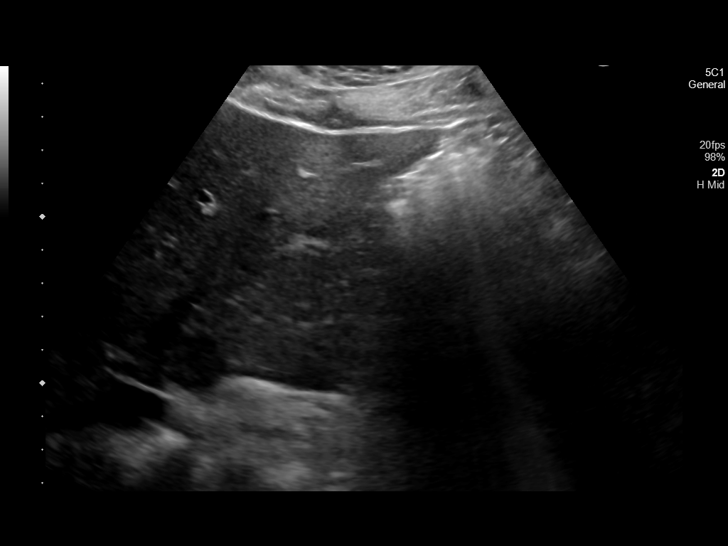
[im 25/46]
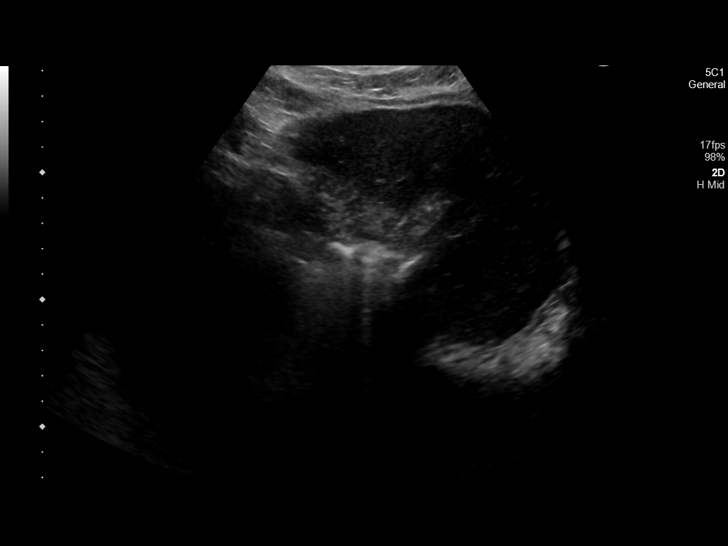
[im 29/46]
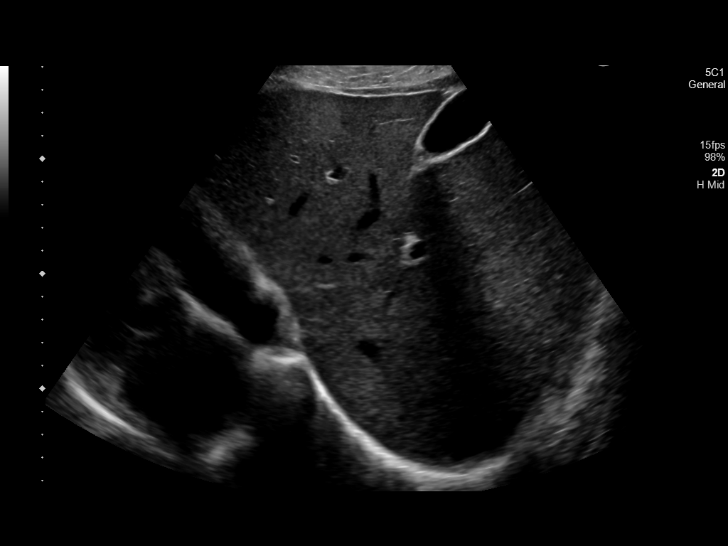
[im 31/46]
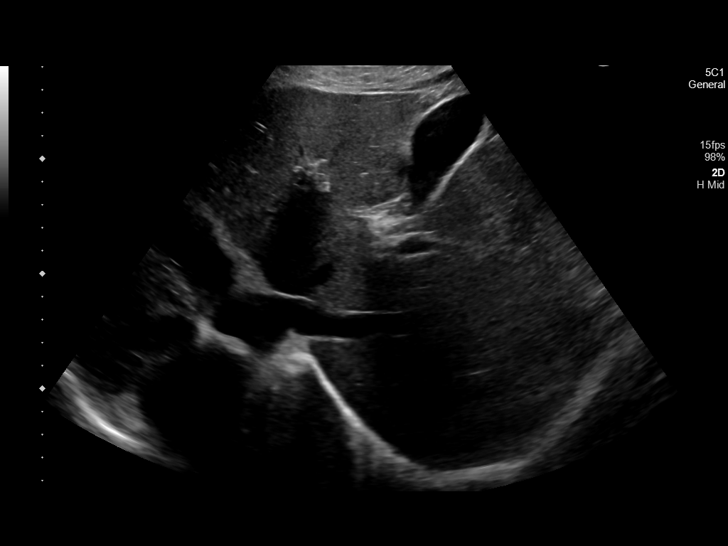
[im 34/46]
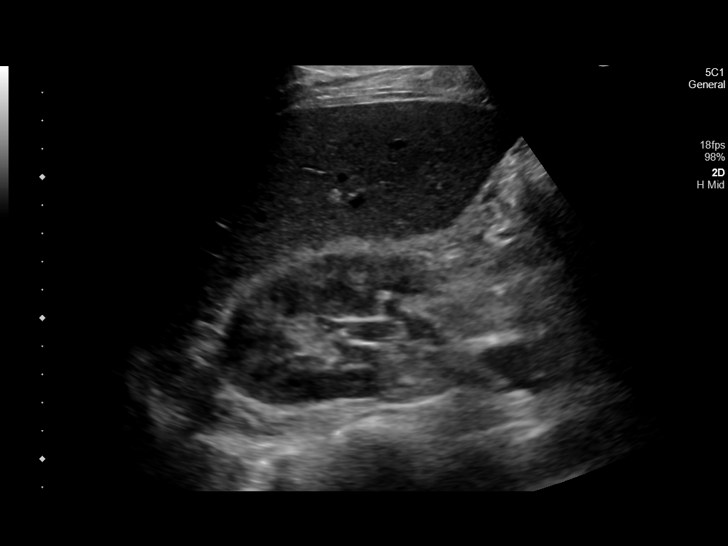
[im 38/46]
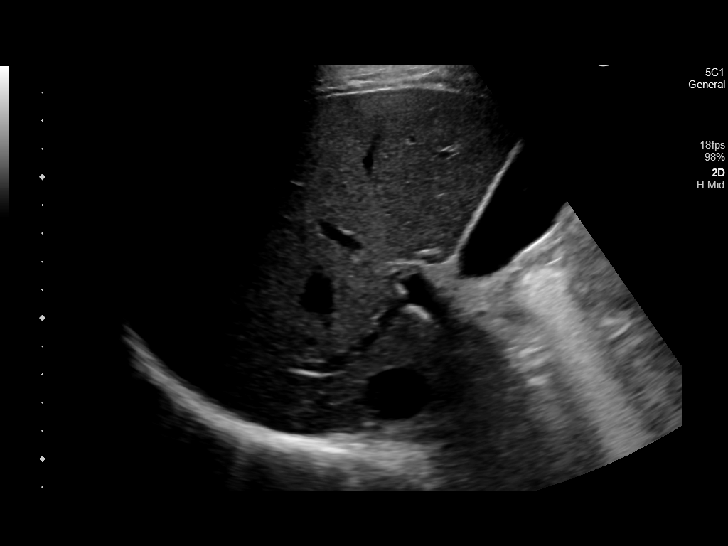
[im 42/46]
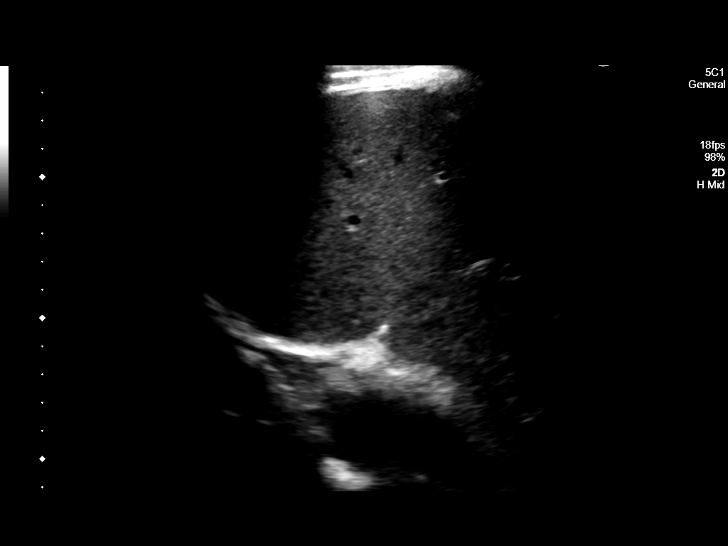
[im 46/46]
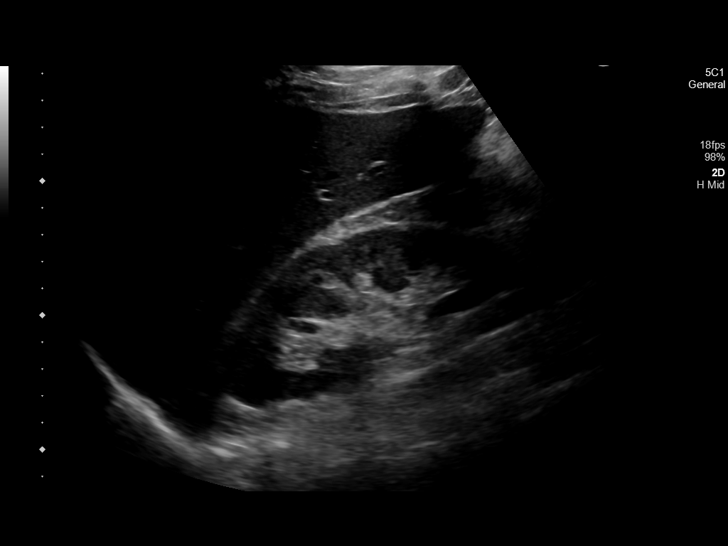

[14 of 25 positions shown; findings below may reference images not displayed]

FINDINGS: Gallbladder:

No gallstones or wall thickening visualized. No sonographic Murphy
sign noted by sonographer.

Common bile duct:

Diameter: 4.5 mm

Liver:

No focal lesion identified. Within normal limits in parenchymal
echogenicity. Portal vein is patent on color Doppler imaging with
normal direction of blood flow towards the liver.

Other: None.
IMPRESSION: No sonographic abnormality is seen in the right upper quadrant.

## 2023-08-30 ENCOUNTER — Other Ambulatory Visit: Payer: Self-pay | Admitting: Internal Medicine

## 2023-09-17 ENCOUNTER — Ambulatory Visit: Admitting: Dermatology

## 2023-09-17 ENCOUNTER — Encounter: Payer: Self-pay | Admitting: Dermatology

## 2023-09-17 DIAGNOSIS — Z85828 Personal history of other malignant neoplasm of skin: Secondary | ICD-10-CM

## 2023-09-17 DIAGNOSIS — D485 Neoplasm of uncertain behavior of skin: Secondary | ICD-10-CM

## 2023-09-17 DIAGNOSIS — D0462 Carcinoma in situ of skin of left upper limb, including shoulder: Secondary | ICD-10-CM

## 2023-09-17 DIAGNOSIS — L814 Other melanin hyperpigmentation: Secondary | ICD-10-CM | POA: Diagnosis not present

## 2023-09-17 DIAGNOSIS — D229 Melanocytic nevi, unspecified: Secondary | ICD-10-CM

## 2023-09-17 DIAGNOSIS — D239 Other benign neoplasm of skin, unspecified: Secondary | ICD-10-CM

## 2023-09-17 DIAGNOSIS — Z1283 Encounter for screening for malignant neoplasm of skin: Secondary | ICD-10-CM

## 2023-09-17 DIAGNOSIS — L409 Psoriasis, unspecified: Secondary | ICD-10-CM

## 2023-09-17 DIAGNOSIS — C44712 Basal cell carcinoma of skin of right lower limb, including hip: Secondary | ICD-10-CM | POA: Diagnosis not present

## 2023-09-17 DIAGNOSIS — L821 Other seborrheic keratosis: Secondary | ICD-10-CM

## 2023-09-17 DIAGNOSIS — D1801 Hemangioma of skin and subcutaneous tissue: Secondary | ICD-10-CM

## 2023-09-17 DIAGNOSIS — D2371 Other benign neoplasm of skin of right lower limb, including hip: Secondary | ICD-10-CM

## 2023-09-17 DIAGNOSIS — D492 Neoplasm of unspecified behavior of bone, soft tissue, and skin: Secondary | ICD-10-CM | POA: Diagnosis not present

## 2023-09-17 DIAGNOSIS — L578 Other skin changes due to chronic exposure to nonionizing radiation: Secondary | ICD-10-CM

## 2023-09-17 DIAGNOSIS — W908XXA Exposure to other nonionizing radiation, initial encounter: Secondary | ICD-10-CM

## 2023-09-17 DIAGNOSIS — D099 Carcinoma in situ, unspecified: Secondary | ICD-10-CM

## 2023-09-17 HISTORY — DX: Carcinoma in situ, unspecified: D09.9

## 2023-09-17 NOTE — Progress Notes (Signed)
 Follow-Up Visit   Subjective  Samuel Riley is a 53 y.o. male who presents for the following: Skin Cancer Screening and Full Body Skin Exam  The patient presents for Total-Body Skin Exam (TBSE) for skin cancer screening and mole check. The patient has spots, moles and lesions to be evaluated, some may be new or changing and the patient may have concern these could be cancer.  Hx BCC, DN. Spot at left forearm, itches. Psoriasis at hands, controlled per patient with Enstilar  prn.   The following portions of the chart were reviewed this encounter and updated as appropriate: medications, allergies, medical history  Review of Systems:  No other skin or systemic complaints except as noted in HPI or Assessment and Plan.  Objective  Well appearing patient in no apparent distress; mood and affect are within normal limits.  A full examination was performed including scalp, head, eyes, ears, nose, lips, neck, chest, axillae, abdomen, back, buttocks, bilateral upper extremities, bilateral lower extremities, hands, feet, fingers, toes, fingernails, and toenails. All findings within normal limits unless otherwise noted below.   Relevant physical exam findings are noted in the Assessment and Plan.  left mid forearm 6 mm pink papule  right anterior mid lower leg 5 mm pink macule   Assessment & Plan   SKIN CANCER SCREENING PERFORMED TODAY.  ACTINIC DAMAGE - Chronic condition, secondary to cumulative UV/sun exposure - diffuse scaly erythematous macules with underlying dyspigmentation - Recommend daily broad spectrum sunscreen SPF 30+ to sun-exposed areas, reapply every 2 hours as needed.  - Staying in the shade or wearing long sleeves, sun glasses (UVA+UVB protection) and wide brim hats (4-inch brim around the entire circumference of the hat) are also recommended for sun protection.  - Call for new or changing lesions.  LENTIGINES, SEBORRHEIC KERATOSES, HEMANGIOMAS - Benign normal skin  lesions - Benign-appearing - Call for any changes  MELANOCYTIC NEVI - Tan-brown and/or pink-flesh-colored symmetric macules and papules - Benign appearing on exam today - Observation - Call clinic for new or changing moles - Recommend daily use of broad spectrum spf 30+ sunscreen to sun-exposed areas.   HISTORY OF BASAL CELL CARCINOMA OF THE SKIN - No evidence of recurrence today - Recommend regular full body skin exams - Recommend daily broad spectrum sunscreen SPF 30+ to sun-exposed areas, reapply every 2 hours as needed.  - Call if any new or changing lesions are noted between office visits  History of Dysplastic Nevi - No evidence of recurrence today - Recommend regular full body skin exams - Recommend daily broad spectrum sunscreen SPF 30+ to sun-exposed areas, reapply every 2 hours as needed.  - Call if any new or changing lesions are noted between office visits  PSORIASIS   Chronic condition with duration or expected duration over one year. Currently well-controlled.   Psoriasis is a chronic non-curable, but treatable genetic/hereditary disease that may have other systemic features affecting other organ systems such as joints (Psoriatic Arthritis). It is associated with an increased risk of inflammatory bowel disease, heart disease, non-alcoholic fatty liver disease, and depression.  Treatments include light and laser treatments; topical medications; and systemic medications including oral and injectables.  Treatment Plan: Continue Enstilar  foam to aa's 1-2 times daily prn.   DERMATOFIBROMA - right thigh, right calf Exam: Firm pink/brown papulenodule with dimple sign. Treatment Plan: A dermatofibroma is a benign growth possibly related to trauma, such as an insect bite, cut from shaving, or inflamed acne-type bump.  Treatment options to remove include  shave or excision with resulting scar and risk of recurrence.  Since benign-appearing and not bothersome, will observe for  now.    NEOPLASM OF UNCERTAIN BEHAVIOR OF SKIN (2) left mid forearm Skin / nail biopsy Type of biopsy: tangential   Informed consent: discussed and consent obtained   Timeout: patient name, date of birth, surgical site, and procedure verified   Procedure prep:  Patient was prepped and draped in usual sterile fashion Prep type:  Isopropyl alcohol  Anesthesia: the lesion was anesthetized in a standard fashion   Anesthetic:  1% lidocaine  w/ epinephrine  1-100,000 buffered w/ 8.4% NaHCO3 Instrument used: DermaBlade   Hemostasis achieved with: pressure and aluminum chloride   Outcome: patient tolerated procedure well   Post-procedure details: sterile dressing applied and wound care instructions given   Dressing type: bandage and petrolatum    Specimen 1 - Surgical pathology Differential Diagnosis: r/o BCC vs SCC  Check Margins: No right anterior mid lower leg Skin / nail biopsy Type of biopsy: tangential   Informed consent: discussed and consent obtained   Timeout: patient name, date of birth, surgical site, and procedure verified   Procedure prep:  Patient was prepped and draped in usual sterile fashion Prep type:  Isopropyl alcohol  Anesthesia: the lesion was anesthetized in a standard fashion   Anesthetic:  1% lidocaine  w/ epinephrine  1-100,000 buffered w/ 8.4% NaHCO3 Instrument used: DermaBlade   Hemostasis achieved with: pressure and aluminum chloride   Outcome: patient tolerated procedure well   Post-procedure details: sterile dressing applied and wound care instructions given   Dressing type: bandage and petrolatum    Specimen 2 - Surgical pathology Differential Diagnosis: r/o BCC   Check Margins: No MULTIPLE BENIGN NEVI   LENTIGINES   ACTINIC ELASTOSIS   SEBORRHEIC KERATOSES   CHERRY ANGIOMA   DERMATOFIBROMA   Return in about 6 months (around 03/19/2024) for TBSE, with Dr. Claudene, [facility], HxDN.  LILLETTE Lonell Drones, RMA, am acting as scribe for Boneta Claudene, MD .   Documentation: I have reviewed the above documentation for accuracy and completeness, and I agree with the above.  Boneta Claudene, MD

## 2023-09-17 NOTE — Patient Instructions (Signed)
 Wound Care Instructions  Cleanse wound gently with soap and water once a day then pat dry with clean gauze. Apply a thin coat of Petrolatum (petroleum jelly, "Vaseline") over the wound (unless you have an allergy to this). We recommend that you use a new, sterile tube of Vaseline. Do not pick or remove scabs. Do not remove the yellow or white "healing tissue" from the base of the wound.  Cover the wound with fresh, clean, nonstick gauze and secure with paper tape. You may use Band-Aids in place of gauze and tape if the wound is small enough, but would recommend trimming much of the tape off as there is often too much. Sometimes Band-Aids can irritate the skin.  You should call the office for your biopsy report after 1 week if you have not already been contacted.  If you experience any problems, such as abnormal amounts of bleeding, swelling, significant bruising, significant pain, or evidence of infection, please call the office immediately.  FOR ADULT SURGERY PATIENTS: If you need something for pain relief you may take 1 extra strength Tylenol (acetaminophen) AND 2 Ibuprofen (200mg  each) together every 4 hours as needed for pain. (do not take these if you are allergic to them or if you have a reason you should not take them.) Typically, you may only need pain medication for 1 to 3 days.   Melanoma ABCDEs  Melanoma is the most dangerous type of skin cancer, and is the leading cause of death from skin disease.  You are more likely to develop melanoma if you: Have light-colored skin, light-colored eyes, or red or blond hair Spend a lot of time in the sun Tan regularly, either outdoors or in a tanning bed Have had blistering sunburns, especially during childhood Have a close family member who has had a melanoma Have atypical moles or large birthmarks  Early detection of melanoma is key since treatment is typically straightforward and cure rates are extremely high if we catch it early.   The  first sign of melanoma is often a change in a mole or a new dark spot.  The ABCDE system is a way of remembering the signs of melanoma.  A for asymmetry:  The two halves do not match. B for border:  The edges of the growth are irregular. C for color:  A mixture of colors are present instead of an even brown color. D for diameter:  Melanomas are usually (but not always) greater than 6mm - the size of a pencil eraser. E for evolution:  The spot keeps changing in size, shape, and color.  Please check your skin once per month between visits. You can use a small mirror in front and a large mirror behind you to keep an eye on the back side or your body.   If you see any new or changing lesions before your next follow-up, please call to schedule a visit.  Please continue daily skin protection including broad spectrum sunscreen SPF 30+ to sun-exposed areas, reapplying every 2 hours as needed when you're outdoors.    Due to recent changes in healthcare laws, you may see results of your pathology and/or laboratory studies on MyChart before the doctors have had a chance to review them. We understand that in some cases there may be results that are confusing or concerning to you. Please understand that not all results are received at the same time and often the doctors may need to interpret multiple results in order to provide you  with the best plan of care or course of treatment. Therefore, we ask that you please give [place] 2 business days to thoroughly review all your results before contacting the office for clarification. Should we see a critical lab result, you will be contacted sooner.   If You Need Anything After Your Visit  If you have any questions or concerns for your doctor, please call our main line at [phone] and press option 4 to reach your doctor's medical assistant. If no one answers, please leave a voicemail as directed and we will return your call as soon as possible. Messages left after 4  pm will be answered the following business day.   You may also send [place] a message via MyChart. We typically respond to MyChart messages within 1-2 business days.  For prescription refills, please ask your pharmacy to contact our office. Our fax number is [phone].  If you have an urgent issue when the clinic is closed that cannot wait until the next business day, you can page your doctor at the number below.    Please note that while we do our best to be available for urgent issues outside of office hours, we are not available 24/7.   If you have an urgent issue and are unable to reach [place], you may choose to seek medical care at your doctor's office, retail clinic, urgent care center, or emergency room.  If you have a medical emergency, please immediately call 911 or go to the emergency department.  Pager Numbers  - Dr. Gwen Pounds: [phone]  - Dr. Roseanne Reno: [phone]  - Dr. Katrinka Blazing: [phone]   In the event of inclement weather, please call our main line at [phone] for an update on the status of any delays or closures.  Dermatology Medication Tips: Please keep the boxes that topical medications come in in order to help keep track of the instructions about where and how to use these. Pharmacies typically print the medication instructions only on the boxes and not directly on the medication tubes.   If your medication is too expensive, please contact our office at [phone] option 4 or send [place] a message through MyChart.   We are unable to tell what your co-pay for medications will be in advance as this is different depending on your insurance coverage. However, we may be able to find a substitute medication at lower cost or fill out paperwork to get insurance to cover a needed medication.   If a prior authorization is required to get your medication covered by your insurance company, please allow [place] 1-2 business days to complete this process.  Drug prices often vary  depending on where the prescription is filled and some pharmacies may offer cheaper prices.  The website www.goodrx.com contains coupons for medications through different pharmacies. The prices here do not account for what the cost may be with help from insurance (it may be cheaper with your insurance), but the website can give you the price if you did not use any insurance.  - You can print the associated coupon and take it with your prescription to the pharmacy.  - You may also stop by our office during regular business hours and pick up a GoodRx coupon card.  - If you need your prescription sent electronically to a different pharmacy, notify our office through [facility] or by phone at [phone] option 4.     Si Usted Necesita Algo Despus de Su Visita  Tambin puede enviarnos un mensaje a  travs de MyChart. Por lo general respondemos a los mensajes de MyChart en el transcurso de 1 a 2 das hbiles.  Para renovar recetas, por favor pida a su farmacia que se ponga en contacto con nuestra oficina. Annie Sable de fax es [place] [phone].  Si tiene un asunto urgente cuando la clnica est cerrada y que no puede esperar hasta el siguiente da hbil, puede llamar/localizar a su doctor(a) al nmero que aparece a continuacin.   Por favor, tenga en cuenta que aunque hacemos todo lo posible para estar disponibles para asuntos urgentes fuera del horario de Edie, no estamos disponibles las 24 horas del da, los 7 [address]  [address] de la [place].   Si tiene un problema urgente y no puede comunicarse con nosotros, puede optar por buscar atencin mdica  en el consultorio de su doctor(a), en una clnica privada, en un centro de atencin urgente o en una sala de emergencias.  Si tiene Engineer, drilling, por favor llame inmediatamente al 911 o vaya a la sala de emergencias.  Nmeros de bper  - Dr. Gwen Pounds: [phone]  - Dra. Roseanne Reno: [phone]  - Dr. Katrinka Blazing: [phone]   En caso de  inclemencias del tiempo, por favor llame a Lacy Duverney principal al [phone] para una actualizacin sobre el [place] de cualquier retraso o cierre.  Consejos para la medicacin en dermatologa: Por favor, guarde las cajas en las que vienen los medicamentos de uso tpico para ayudarle a seguir las instrucciones sobre dnde y cmo usarlos. Las farmacias generalmente imprimen las instrucciones del medicamento slo en las cajas y no directamente en los tubos del [place].   Si su medicamento es muy caro, por favor, pngase en contacto con Rolm Gala llamando al [phone] y presione la opcin 4 o envenos un mensaje a travs de Clinical cytogeneticist.   No podemos decirle cul ser su copago por los medicamentos por adelantado ya que esto es diferente dependiendo de la cobertura de su seguro. Sin embargo, es posible que podamos encontrar un medicamento sustituto a Audiological scientist un formulario para que el seguro cubra el medicamento que se considera necesario.   Si se requiere una autorizacin previa para que su compaa de seguros [place] su medicamento, por favor permtanos de 1 a 2 das hbiles para completar [address]  Los precios de los medicamentos varan con frecuencia dependiendo del Environmental consultant de dnde se surte la receta y alguna farmacias pueden ofrecer precios ms baratos.  El sitio web www.goodrx.com tiene cupones para medicamentos de Health and safety inspector. Los precios aqu no tienen en cuenta lo que podra costar con la ayuda del seguro (puede ser ms barato con su seguro), pero el sitio web puede darle el precio si no utiliz Tourist information centre manager.  - Puede imprimir el cupn correspondiente y llevarlo con su receta a la farmacia.  - Tambin puede pasar por nuestra oficina durante el horario de atencin regular y Education officer, museum una tarjeta de cupones de GoodRx.  - Si necesita que su receta se enve electrnicamente a una farmacia diferente, informe a nuestra oficina a travs de MyChart de [place] o  por telfono llamando al [phone] y presione la opcin 4.

## 2023-09-20 ENCOUNTER — Ambulatory Visit: Payer: Self-pay | Admitting: Dermatology

## 2023-09-20 LAB — SURGICAL PATHOLOGY

## 2023-09-23 NOTE — Telephone Encounter (Signed)
 Called patient to discuss path results and treatment options. N/A. LMOVM to return call.

## 2023-09-23 NOTE — Telephone Encounter (Signed)
 I returned patient's call, discussed pathology results, and treatment options. ED&C x 2 scheduled.

## 2023-09-23 NOTE — Telephone Encounter (Signed)
-----   Message from [facility] sent at 09/20/2023  8:13 PM EDT ----- Diagnosis 1. Skin, left mid forearm :       SQUAMOUS CELL CARCINOMA IN SITU        2. Skin, right anterior mid lower leg :       SUPERFICIAL BASAL CELL CARCINOMA   Please call with diagnosis and message me with patient's decision on treatment. Offer 6 month skin check  L FOREARM  Explanation: Biopsy shows a squamous cell skin cancer limited to the top layer of skin. This means it is an early cancer and has not spread. However, it has the potential to spread beyond the skin  and threaten your health, so we recommend treating it.  R lower leg  Explanation: your biopsy shows a basal cell skin cancer limited to the top layer of skin.  TREATMENT OPTIONS FOR BOTH Treatment option 1: a cream (fluorouracil and calcipotriene) that helps your immune system clear the skin cancer. It will cause redness and irritation. Wait two weeks after the biopsy to start  applying the cream. Apply the cream twice per day until the redness and irritation develop (usually occurs by day 7), then stop and allow it to heal. We will recheck the area in 2 months to ensure  the cancer is gone. The cream is $45 plus shipping and will be mailed to you from a low cost compounding pharmacy.  Treatment option 2: you return for a brief appointment where I perform electrodesiccation and curettage [facility]). This involves three rounds of scraping and burning to destroy the skin cancer. It has  about an 85% cure rate and leaves a round wound slightly larger than the skin cancer and leaves a round white scar. No additional pathology is done. If the skin cancer comes back, we would need to do  a surgery to remove it.     ----- Message ----- From: Interface, Lab In Three Zero One Sent: 09/20/2023   5:36 PM EDT To: Boneta Sharps, MD

## 2023-09-24 ENCOUNTER — Other Ambulatory Visit: Payer: Self-pay | Admitting: Internal Medicine

## 2023-09-24 NOTE — Telephone Encounter (Signed)
 Refilled: 03/22/2023 Last OV: 03/27/2023 Next OV: not scheduled

## 2023-09-25 ENCOUNTER — Other Ambulatory Visit: Payer: Self-pay | Admitting: Internal Medicine

## 2023-09-25 DIAGNOSIS — Z79899 Other long term (current) drug therapy: Secondary | ICD-10-CM

## 2023-09-30 ENCOUNTER — Ambulatory Visit: Admitting: Dermatology

## 2023-09-30 ENCOUNTER — Encounter: Payer: Self-pay | Admitting: Dermatology

## 2023-09-30 DIAGNOSIS — D0462 Carcinoma in situ of skin of left upper limb, including shoulder: Secondary | ICD-10-CM | POA: Diagnosis not present

## 2023-09-30 DIAGNOSIS — C44712 Basal cell carcinoma of skin of right lower limb, including hip: Secondary | ICD-10-CM

## 2023-09-30 DIAGNOSIS — D099 Carcinoma in situ, unspecified: Secondary | ICD-10-CM

## 2023-09-30 NOTE — Progress Notes (Signed)
   Follow-Up Visit   Subjective  Samuel Riley is a 53 y.o. male who presents for the following: EDC x 2. SCCis at left mid forearm and BCC at right anterior mid lower leg.   The following portions of the chart were reviewed this encounter and updated as appropriate: medications, allergies, medical history  Review of Systems:  No other skin or systemic complaints except as noted in HPI or Assessment and Plan.  Objective  Well appearing patient in no apparent distress; mood and affect are within normal limits.   A focused examination was performed of the following areas: Left arm, right leg  Relevant exam findings are noted in the Assessment and Plan.  right anterior mid lower leg Pink bx site left mid forearm Pink bx site  Assessment & Plan     BASAL CELL CARCINOMA (BCC) OF SKIN OF RIGHT LOWER EXTREMITY INCLUDING HIP right anterior mid lower leg Destruction of lesion Complexity: simple   Destruction method: electrodesiccation and curettage   Informed consent: discussed and consent obtained   Timeout:  patient name, date of birth, surgical site, and procedure verified Procedure prep:  Patient was prepped and draped in usual sterile fashion Prep type:  Isopropyl alcohol  Anesthesia: the lesion was anesthetized in a standard fashion   Anesthetic:  1% lidocaine  w/ epinephrine  1-100,000 local infiltration Curettage performed in three different directions: Yes   Electrodesiccation performed over the curetted area: Yes   Curettage cycles:  3 Final wound size (cm):  0.9 Hemostasis achieved with:  aluminum chloride and electrodesiccation Outcome: patient tolerated procedure well with no complications   Post-procedure details: sterile dressing applied and wound care instructions given   Dressing type: bandage and petrolatum    SQUAMOUS CELL CARCINOMA IN SITU left mid forearm Destruction of lesion Complexity: simple   Destruction method: electrodesiccation and curettage    Informed consent: discussed and consent obtained   Timeout:  patient name, date of birth, surgical site, and procedure verified Procedure prep:  Patient was prepped and draped in usual sterile fashion Prep type:  Isopropyl alcohol  Anesthesia: the lesion was anesthetized in a standard fashion   Anesthetic:  1% lidocaine  w/ epinephrine  1-100,000 local infiltration Curettage performed in three different directions: Yes   Electrodesiccation performed over the curetted area: Yes   Curettage cycles:  3 Final wound size (cm):  0.8 Hemostasis achieved with:  aluminum chloride and electrodesiccation Outcome: patient tolerated procedure well with no complications   Post-procedure details: sterile dressing applied and wound care instructions given   Dressing type: bandage and petrolatum     Return for TBSE, as scheduled, HxBCC, HxDN, HxSCC.  LILLETTE Lonell Drones, RMA, am acting as scribe for Boneta Sharps, MD .   Documentation: I have reviewed the above documentation for accuracy and completeness, and I agree with the above.  Boneta Sharps, MD

## 2023-09-30 NOTE — Patient Instructions (Signed)

## 2023-10-31 ENCOUNTER — Ambulatory Visit: Payer: Self-pay | Admitting: Internal Medicine

## 2023-10-31 ENCOUNTER — Other Ambulatory Visit (INDEPENDENT_AMBULATORY_CARE_PROVIDER_SITE_OTHER)

## 2023-10-31 ENCOUNTER — Other Ambulatory Visit: Payer: Self-pay | Admitting: Internal Medicine

## 2023-10-31 DIAGNOSIS — Z79899 Other long term (current) drug therapy: Secondary | ICD-10-CM | POA: Diagnosis not present

## 2023-10-31 LAB — COMPREHENSIVE METABOLIC PANEL WITH GFR
ALT: 17 U/L (ref 0–53)
AST: 18 U/L (ref 0–37)
Albumin: 4.4 g/dL (ref 3.5–5.2)
Alkaline Phosphatase: 51 U/L (ref 39–117)
BUN: 21 mg/dL (ref 6–23)
CO2: 28 meq/L (ref 19–32)
Calcium: 9.5 mg/dL (ref 8.4–10.5)
Chloride: 103 meq/L (ref 96–112)
Creatinine, Ser: 1.05 mg/dL (ref 0.40–1.50)
GFR: 80.5 mL/min (ref >60.00)
Glucose, Bld: 59 mg/dL — ABNORMAL LOW (ref 70–99)
Potassium: 4.2 meq/L (ref 3.5–5.1)
Sodium: 139 meq/L (ref 135–145)
Total Bilirubin: 0.4 mg/dL (ref 0.2–1.2)
Total Protein: 7 g/dL (ref 6.0–8.3)

## 2023-10-31 LAB — CBC WITH DIFFERENTIAL/PLATELET
Basophils Absolute: 0.1 K/uL (ref 0.0–0.1)
Basophils Relative: 0.5 % (ref 0.0–3.0)
Eosinophils Absolute: 0.3 K/uL (ref 0.0–0.7)
Eosinophils Relative: 2.5 % (ref 0.0–5.0)
HCT: 47 % (ref 39.0–52.0)
Hemoglobin: 16 g/dL (ref 13.0–17.0)
Lymphocytes Relative: 17.5 % (ref 12.0–46.0)
Lymphs Abs: 1.9 K/uL (ref 0.7–4.0)
MCHC: 34.1 g/dL (ref 30.0–36.0)
MCV: 100.9 fl — ABNORMAL HIGH (ref 78.0–100.0)
Monocytes Absolute: 1.3 K/uL — ABNORMAL HIGH (ref 0.1–1.0)
Monocytes Relative: 12.3 % — ABNORMAL HIGH (ref 3.0–12.0)
Neutro Abs: 7.3 K/uL (ref 1.4–7.7)
Neutrophils Relative %: 67.2 % (ref 43.0–77.0)
Platelets: 382 K/uL (ref 150.0–400.0)
RBC: 4.66 Mil/uL (ref 4.22–5.81)
RDW: 13.3 % (ref 11.5–15.5)
WBC: 10.8 K/uL — ABNORMAL HIGH (ref 4.0–10.5)

## 2023-10-31 NOTE — Telephone Encounter (Signed)
 Pt is scheduled to come in this morning to have his blood work done so medication can be refilled per last refill request.

## 2023-11-10 ENCOUNTER — Other Ambulatory Visit: Payer: Self-pay | Admitting: Internal Medicine

## 2023-11-29 ENCOUNTER — Telehealth: Payer: Self-pay

## 2023-11-29 DIAGNOSIS — Z860100 Personal history of colon polyps, unspecified: Secondary | ICD-10-CM

## 2023-11-29 NOTE — Telephone Encounter (Signed)
 Call returned. Informed patient  his colonoscopy will be due in January and the scope schedule is not open yet for January.  I plan to contact him when the scope schedule is available.  Last colonoscopy 03/17/21 recommended repeat in 3 years due 03/20/24.  Thanks,  [place], CMA

## 2023-11-29 NOTE — Telephone Encounter (Signed)
 The patient called to schedule his colonoscopy after receiving a notification in MyChart indicating it is time to schedule the procedure.

## 2023-12-26 NOTE — Telephone Encounter (Signed)
 Pt call to see when colonoscopy would be due. Pt states he will wait for [facility]  to call to schedule.

## 2023-12-30 ENCOUNTER — Other Ambulatory Visit: Payer: Self-pay

## 2023-12-30 DIAGNOSIS — Z860100 Personal history of colon polyps, unspecified: Secondary | ICD-10-CM

## 2023-12-30 MED ORDER — SUTAB 1479-225-188 MG PO TABS: 12.0000 | ORAL_TABLET | Freq: Two times a day (BID) | ORAL | 0 refills | Status: AC

## 2023-12-30 NOTE — Addendum Note (Signed)
 Addended by: CURTISS ROSALINE RAMAN on: 12/30/2023 12:47 PM   Modules accepted: Orders

## 2024-01-06 ENCOUNTER — Other Ambulatory Visit: Payer: Self-pay | Admitting: Internal Medicine

## 2024-01-28 DIAGNOSIS — M19131 Post-traumatic osteoarthritis, right wrist: Secondary | ICD-10-CM | POA: Diagnosis not present

## 2024-01-28 DIAGNOSIS — G5601 Carpal tunnel syndrome, right upper limb: Secondary | ICD-10-CM | POA: Diagnosis not present

## 2024-01-28 DIAGNOSIS — S52531S Colles' fracture of right radius, sequela: Secondary | ICD-10-CM | POA: Diagnosis not present

## 2024-01-28 DIAGNOSIS — M720 Palmar fascial fibromatosis [Dupuytren]: Secondary | ICD-10-CM | POA: Diagnosis not present

## 2024-03-15 ENCOUNTER — Other Ambulatory Visit: Payer: Self-pay | Admitting: Internal Medicine
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
## 2024-03-25 ENCOUNTER — Encounter: Payer: Self-pay | Admitting: Internal Medicine

## 2024-03-25 ENCOUNTER — Ambulatory Visit: Admitting: Internal Medicine

## 2024-03-25 VITALS — BP 120/70 | HR 84 | Temp 98.1°F | Ht 68.0 in | Wt 173.6 lb

## 2024-03-25 DIAGNOSIS — F901 Attention-deficit hyperactivity disorder, predominantly hyperactive type: Secondary | ICD-10-CM

## 2024-03-25 DIAGNOSIS — E538 Deficiency of other specified B group vitamins: Secondary | ICD-10-CM

## 2024-03-25 DIAGNOSIS — E871 Hypo-osmolality and hyponatremia: Secondary | ICD-10-CM

## 2024-03-25 DIAGNOSIS — F1020 Alcohol dependence, uncomplicated: Secondary | ICD-10-CM

## 2024-03-25 DIAGNOSIS — E785 Hyperlipidemia, unspecified: Secondary | ICD-10-CM

## 2024-03-25 DIAGNOSIS — Z0000 Encounter for general adult medical examination without abnormal findings: Secondary | ICD-10-CM | POA: Diagnosis not present

## 2024-03-25 DIAGNOSIS — R300 Dysuria: Secondary | ICD-10-CM | POA: Diagnosis not present

## 2024-03-25 DIAGNOSIS — Z113 Encounter for screening for infections with a predominantly sexual mode of transmission: Secondary | ICD-10-CM

## 2024-03-25 DIAGNOSIS — Z23 Encounter for immunization: Secondary | ICD-10-CM

## 2024-03-25 NOTE — Progress Notes (Signed)
 Patient ID: Samuel Riley, male    DOB: Nov 03, 1970  Age: 54 y.o. MRN: [id number]  The patient is here for annual preventive examination and management of other chronic and acute problems.   The risk factors are reflected in the social history.   The roster of all physicians providing medical care to patient - is listed in the Snapshot section of the chart.   Activities of daily living:  The patient is 100% independent in all ADLs: dressing, toileting, feeding as well as independent mobility   Home safety : The patient has smoke detectors in the home. They wear seatbelts.  There are no unsecured firearms at home. There is no violence in the home.    There is no risks for hepatitis, STDs or HIV. There is no   history of blood transfusion. They have no travel history to infectious disease endemic areas of the world.   The patient has seen their dentist in the last six month. They have seen their eye doctor in the last year. The patinet  denies slight hearing difficulty with regard to whispered voices and some television programs.  They have deferred audiologic testing in the last year.  They do not  have excessive sun exposure. Discussed the need for sun protection: hats, long sleeves and use of sunscreen if there is significant sun exposure.    Diet: the importance of a healthy diet is discussed. They do have a healthy diet.   The benefits of regular aerobic exercise were discussed. The patient  exercises  3 to 5 days per week  for  60 minutes.    Depression screen: there are no signs or vegative symptoms of depression- irritability, change in appetite, anhedonia, sadness/tearfullness.   The following portions of the patient's history were reviewed and updated as appropriate: allergies, current medications, past family history, past medical history,  past surgical history, past social history  and problem list.   Visual acuity was not assessed per patient preference since the patient has  regular follow up with an  ophthalmologist. Hearing and body mass index were assessed and reviewed.    During the course of the visit the patient was educated and counseled about appropriate screening and preventive services including : fall prevention , diabetes screening, nutrition counseling, colorectal cancer screening, and recommended immunizations.    Chief Complaint:  H/O ALCOHOL  ABUSE WITH PRIOR ICH .  Has self dc'd depakote  .  Stll going to  [facility]  has 15 months of sobriety  soon .     Quit smoking 1.5 yrs ago   Exercising daily , uses devotionals,  then banking,, then news while exercising    Review of Symptoms  Patient denies headache, fevers, malaise, unintentional weight loss, skin rash, eye pain, sinus congestion and sinus pain, sore throat, dysphagia,  hemoptysis , cough, dyspnea, wheezing, chest pain, palpitations, orthopnea, edema, abdominal pain, nausea, melena, diarrhea, constipation, flank pain, dysuria, hematuria, urinary  Frequency, nocturia, numbness, tingling, seizures,  Focal weakness, Loss of consciousness,  Tremor, insomnia, depression, anxiety, and suicidal ideation.    Physical Exam:  BP 120/70 (BP Location: Left Arm)   Pulse 84   Temp 98.1 F (36.7 C)   Ht 5' 8 (1.727 m)   Wt 173 lb 9.6 oz (78.7 kg)   SpO2 95%   BMI 26.40 kg/m    Physical Exam Vitals reviewed.  Constitutional:      General: He is not in acute distress.    Appearance: Normal appearance. He is  normal weight. He is not ill-appearing, toxic-appearing or diaphoretic.  HENT:     Head: Normocephalic and atraumatic.     Right Ear: Tympanic membrane, ear canal and external ear normal. There is no impacted cerumen.     Left Ear: Tympanic membrane, ear canal and external ear normal. There is no impacted cerumen.     Nose: Nose normal.     Mouth/Throat:     Mouth: Mucous membranes are moist.     Pharynx: Oropharynx is clear.  Eyes:     General: No scleral icterus.       Right eye: No  discharge.        Left eye: No discharge.     Conjunctiva/sclera: Conjunctivae normal.  Neck:     Thyroid : No thyromegaly.     Vascular: No carotid bruit or JVD.  Cardiovascular:     Rate and Rhythm: Normal rate and regular rhythm.     Heart sounds: Normal heart sounds.  Pulmonary:     Effort: Pulmonary effort is normal. No respiratory distress.     Breath sounds: Normal breath sounds.  Abdominal:     General: Bowel sounds are normal.     Palpations: Abdomen is soft. There is no mass.     Tenderness: There is no abdominal tenderness. There is no guarding or rebound.  Musculoskeletal:        General: Normal range of motion.     Cervical back: Normal range of motion and neck supple.  Lymphadenopathy:     Cervical: No cervical adenopathy.  Skin:    General: Skin is warm and dry.  Neurological:     General: No focal deficit present.     Mental Status: He is alert and oriented to person, place, and time. Mental status is at baseline.  Psychiatric:        Mood and Affect: Mood normal.        Behavior: Behavior normal.        Thought Content: Thought content normal.        Judgment: Judgment normal.    Assessment and Plan: Need for influenza vaccination -     Flu vaccine trivalent PF, 6mos and older(Flulaval,Afluria,Fluarix,Fluzone)  Need for pneumococcal 20-valent conjugate vaccination -     Pneumococcal conjugate vaccine 20-valent  Dysuria -     PSA  Hypomagnesemia -     Magnesium   Dyslipidemia -     TSH -     Lipid panel -     LDL cholesterol, direct  B12 deficiency -     B12 and Folate Panel  Screen for STD (sexually transmitted disease) Assessment & Plan: Screening for HIV and Hep C   Orders: -     HIV Antibody (routine testing w rflx) -     Hepatitis C antibody  Hypo-osmolar hyponatremia -     Comprehensive metabolic panel with GFR  Hyperactivity disorder, predominantly hyperactive-impulsive type Assessment & Plan: No longer taking  depakote      Encounter for preventive health examination Assessment & Plan: age appropriate education and counseling updated, referrals for preventative services and immunizations addressed, dietary and smoking counseling addressed, most recent labs reviewed.  I have personally reviewed and have noted:   1) the patient's medical and social history 2) The pt's use of alcohol , tobacco, and illicit drugs 3) The patient's current medications and supplements 4) Functional ability including ADL's, fall risk, home safety risk, hearing and visual impairment 5) Diet and physical activities 6) Evidence for depression  or mood disorder 7) The patient's height, weight, and BMI have been recorded in the chart     I have made referrals, and provided counseling and education based on review of the above    Alcohol  use disorder, severe, dependence (HCC) Assessment & Plan: He continues to attend regular meetins with AA.  He has had multiple  admissions for relapses but has been sober for the past 15 monts.  He has seld discontinued topiramate  and depakote      No follow-ups on file.  Verneita LITTIE Kettering, MD

## 2024-03-25 NOTE — Assessment & Plan Note (Signed)
 He continues to attend regular meetins with AA.  He has had multiple  admissions for relapses but has been sober for the past 15 monts.  He has seld discontinued topiramate  and depakote 

## 2024-03-25 NOTE — Assessment & Plan Note (Signed)

## 2024-03-25 NOTE — Assessment & Plan Note (Signed)
 No longer taking  depakote 

## 2024-03-25 NOTE — Assessment & Plan Note (Signed)
 Screening for HIV and Hep C

## 2024-03-26 ENCOUNTER — Encounter: Payer: Self-pay | Admitting: Dermatology

## 2024-03-26 ENCOUNTER — Ambulatory Visit: Admitting: Dermatology

## 2024-03-26 DIAGNOSIS — L814 Other melanin hyperpigmentation: Secondary | ICD-10-CM

## 2024-03-26 DIAGNOSIS — D1801 Hemangioma of skin and subcutaneous tissue: Secondary | ICD-10-CM

## 2024-03-26 DIAGNOSIS — W908XXA Exposure to other nonionizing radiation, initial encounter: Secondary | ICD-10-CM

## 2024-03-26 DIAGNOSIS — Z872 Personal history of diseases of the skin and subcutaneous tissue: Secondary | ICD-10-CM | POA: Diagnosis not present

## 2024-03-26 DIAGNOSIS — Z86007 Personal history of in-situ neoplasm of skin: Secondary | ICD-10-CM

## 2024-03-26 DIAGNOSIS — Z86018 Personal history of other benign neoplasm: Secondary | ICD-10-CM

## 2024-03-26 DIAGNOSIS — L578 Other skin changes due to chronic exposure to nonionizing radiation: Secondary | ICD-10-CM

## 2024-03-26 DIAGNOSIS — Z85828 Personal history of other malignant neoplasm of skin: Secondary | ICD-10-CM

## 2024-03-26 DIAGNOSIS — L821 Other seborrheic keratosis: Secondary | ICD-10-CM | POA: Diagnosis not present

## 2024-03-26 DIAGNOSIS — D229 Melanocytic nevi, unspecified: Secondary | ICD-10-CM

## 2024-03-26 DIAGNOSIS — D239 Other benign neoplasm of skin, unspecified: Secondary | ICD-10-CM

## 2024-03-26 DIAGNOSIS — Z1283 Encounter for screening for malignant neoplasm of skin: Secondary | ICD-10-CM

## 2024-03-26 DIAGNOSIS — L409 Psoriasis, unspecified: Secondary | ICD-10-CM | POA: Diagnosis not present

## 2024-03-26 LAB — B12 AND FOLATE PANEL
Folate: 10.6 ng/mL
Vitamin B-12: 278 pg/mL (ref 211–911)

## 2024-03-26 LAB — COMPREHENSIVE METABOLIC PANEL WITH GFR
ALT: 19 U/L (ref 3–53)
AST: 21 U/L (ref 5–37)
Albumin: 4.5 g/dL (ref 3.5–5.2)
Alkaline Phosphatase: 54 U/L (ref 39–117)
BUN: 17 mg/dL (ref 6–23)
CO2: 31 meq/L (ref 19–32)
Calcium: 9.6 mg/dL (ref 8.4–10.5)
Chloride: 101 meq/L (ref 96–112)
Creatinine, Ser: 0.94 mg/dL (ref 0.40–1.50)
GFR: 91.67 mL/min
Glucose, Bld: 89 mg/dL (ref 70–99)
Potassium: 4.2 meq/L (ref 3.5–5.1)
Sodium: 137 meq/L (ref 135–145)
Total Bilirubin: 0.5 mg/dL (ref 0.2–1.2)
Total Protein: 7.1 g/dL (ref 6.0–8.3)

## 2024-03-26 LAB — LIPID PANEL
Cholesterol: 230 mg/dL — ABNORMAL HIGH (ref 28–200)
HDL: 33.8 mg/dL — ABNORMAL LOW
LDL Cholesterol: 153 mg/dL — ABNORMAL HIGH (ref 10–99)
NonHDL: 196.62
Total CHOL/HDL Ratio: 7
Triglycerides: 219 mg/dL — ABNORMAL HIGH (ref 10.0–149.0)
VLDL: 43.8 mg/dL — ABNORMAL HIGH (ref 0.0–40.0)

## 2024-03-26 LAB — HIV ANTIBODY (ROUTINE TESTING W REFLEX)
HIV 1&2 Ab, 4th Generation: NONREACTIVE
HIV FINAL INTERPRETATION: NEGATIVE

## 2024-03-26 LAB — HEPATITIS C ANTIBODY: Hepatitis C Ab: NONREACTIVE

## 2024-03-26 LAB — PSA: PSA: 0.38 ng/mL (ref 0.10–4.00)

## 2024-03-26 LAB — TSH: TSH: 1.24 u[IU]/mL (ref 0.35–5.50)

## 2024-03-26 LAB — LDL CHOLESTEROL, DIRECT: Direct LDL: 166 mg/dL

## 2024-03-26 LAB — MAGNESIUM: Magnesium: 2.1 mg/dL (ref 1.5–2.5)

## 2024-03-26 NOTE — Progress Notes (Signed)
 "  Follow-Up Visit   Subjective  Samuel Riley is a 54 y.o. male who presents for the following: Skin Cancer Screening and Full Body Skin Exam hx of BCCs, SCC IS, Dysplastic Nevi, Aks, Psoriasis hands Enstilar  foam prn  The patient presents for Total-Body Skin Exam (TBSE) for skin cancer screening and mole check. The patient has spots, moles and lesions to be evaluated, some may be new or changing and the patient may have concern these could be cancer.    The following portions of the chart were reviewed this encounter and updated as appropriate: medications, allergies, medical history  Review of Systems:  No other skin or systemic complaints except as noted in HPI or Assessment and Plan.  Objective  Well appearing patient in no apparent distress; mood and affect are within normal limits.  A full examination was performed including scalp, head, eyes, ears, nose, lips, neck, chest, axillae, abdomen, back, buttocks, bilateral upper extremities, bilateral lower extremities, hands, feet, fingers, toes, fingernails, and toenails. All findings within normal limits unless otherwise noted below.   Relevant physical exam findings are noted in the Assessment and Plan.    Assessment & Plan   SKIN CANCER SCREENING PERFORMED TODAY.  ACTINIC DAMAGE - Chronic condition, secondary to cumulative UV/sun exposure - diffuse scaly erythematous macules with underlying dyspigmentation - Recommend daily broad spectrum sunscreen SPF 30+ to sun-exposed areas, reapply every 2 hours as needed.  - Staying in the shade or wearing long sleeves, sun glasses (UVA+UVB protection) and wide brim hats (4-inch brim around the entire circumference of the hat) are also recommended for sun protection.  - Call for new or changing lesions.  LENTIGINES, SEBORRHEIC KERATOSES, HEMANGIOMAS - Benign normal skin lesions - Benign-appearing - Call for any changes  MELANOCYTIC NEVI - Tan-brown and/or pink-flesh-colored  symmetric macules and papules - Benign appearing on exam today - Observation - Call clinic for new or changing moles - Recommend daily use of broad spectrum spf 30+ sunscreen to sun-exposed areas.   HISTORY OF BASAL CELL CARCINOMA OF THE SKIN - No evidence of recurrence today - Recommend regular full body skin exams - Recommend daily broad spectrum sunscreen SPF 30+ to sun-exposed areas, reapply every 2 hours as needed.  - Call if any new or changing lesions are noted between office visits  - mid vertex scalp, vertex scalp, scalp, L ant deltoid, L sup pectoral, R vertex scalp, R ant mid lower leg  HISTORY OF SQUAMOUS CELL CARCINOMA IN SITU OF THE SKIN - No evidence of recurrence today - Recommend regular full body skin exams - Recommend daily broad spectrum sunscreen SPF 30+ to sun-exposed areas, reapply every 2 hours as needed.  - Call if any new or changing lesions are noted between office visits  -L mid forearm  HISTORY OF DYSPLASTIC NEVUS No evidence of recurrence today Recommend regular full body skin exams Recommend daily broad spectrum sunscreen SPF 30+ to sun-exposed areas, reapply every 2 hours as needed.  Call if any new or changing lesions are noted between office visits  - R proximal ant lat thigh, R mid ant thigh, L low back paraspinal, R supra auricular scalp  HISTORY OF PRECANCEROUS ACTINIC KERATOSIS - site(s) of PreCancerous Actinic Keratosis clear today. - these may recur and new lesions may form requiring treatment to prevent transformation into skin cancer - observe for new or changing spots and contact [facility] for appointment if occur - photoprotection with sun protective clothing; sunglasses and broad spectrum sunscreen  with SPF of at least 30 + and frequent self skin exams recommended - yearly exams by a dermatologist recommended for persons with history of PreCancerous Actinic Keratoses   PSORIASIS hands Exam: hands clear today 0%  BSA.  Chronic condition with duration or expected duration over one year. Currently well-controlled.   Psoriasis is a chronic non-curable, but treatable genetic/hereditary disease that may have other systemic features affecting other organ systems such as joints (Psoriatic Arthritis). It is associated with an increased risk of inflammatory bowel disease, heart disease, non-alcoholic fatty liver disease, and depression.  Treatments include light and laser treatments; topical medications; and systemic medications including oral and injectables.  Treatment Plan: Cont Enstilar  foam qd prn flares, avoid f/g/a  Topical steroids (such as triamcinolone , fluocinolone, fluocinonide, mometasone , clobetasol, halobetasol, betamethasone, hydrocortisone) can cause thinning and lightening of the skin if they are used for too long in the same area. Your physician has selected the right strength medicine for your problem and area affected on the body. Please use your medication only as directed by your physician to prevent side effects.       MULTIPLE BENIGN NEVI   LENTIGINES   ACTINIC ELASTOSIS   SEBORRHEIC KERATOSES   CHERRY ANGIOMA   DERMATOFIBROMA   PSORIASIS   Existing Treatments - Calcipotriene-Betameth Diprop (ENSTILAR ) 0.005-0.064 % FOAM - Apply to affected areas qd-bid prn, Avoid applying to face, groin, and axilla. Use as directed. Long-term use can cause thinning of the skin. Return in about 6 months (around 09/23/2024) for TBSE, Hx of BCC, Hx of SCC IS, Hx of Dysplastic nevi, Hx of AKs.  I, Grayce Saunas, RMA, am acting as scribe for Boneta Sharps, MD .   Documentation: I have reviewed the above documentation for accuracy and completeness, and I agree with the above.  Boneta Sharps, MD    "

## 2024-03-26 NOTE — Patient Instructions (Addendum)
 Recommend daily broad spectrum sunscreen SPF 30+ to sun-exposed areas, reapply every 2 hours as needed. Call for new or changing lesions.  Staying in the shade or wearing long sleeves, sun glasses (UVA+UVB protection) and wide brim hats (4-inch brim around the entire circumference of the hat) are also recommended for sun protection.     Melanoma ABCDEs  Melanoma is the most dangerous type of skin cancer, and is the leading cause of death from skin disease.  You are more likely to develop melanoma if you: Have light-colored skin, light-colored eyes, or red or blond hair Spend a lot of time in the sun Tan regularly, either outdoors or in a tanning bed Have had blistering sunburns, especially during childhood Have a close family member who has had a melanoma Have atypical moles or large birthmarks  Early detection of melanoma is key since treatment is typically straightforward and cure rates are extremely high if we catch it early.   The first sign of melanoma is often a change in a mole or a new dark spot.  The ABCDE system is a way of remembering the signs of melanoma.  A for asymmetry:  The two halves do not match. B for border:  The edges of the growth are irregular. C for color:  A mixture of colors are present instead of an even brown color. D for diameter:  Melanomas are usually (but not always) greater than 6mm - the size of a pencil eraser. E for evolution:  The spot keeps changing in size, shape, and color.  Please check your skin once per month between visits. You can use a small mirror in front and a large mirror behind you to keep an eye on the back side or your body.   If you see any new or changing lesions before your next follow-up, please call to schedule a visit.  Please continue daily skin protection including broad spectrum sunscreen SPF 30+ to sun-exposed areas, reapplying every 2 hours as needed when you're outdoors.   Staying in the shade or wearing long sleeves,  sun glasses (UVA+UVB protection) and wide brim hats (4-inch brim around the entire circumference of the hat) are also recommended for sun protection.      Due to recent changes in healthcare laws, you may see results of your pathology and/or laboratory studies on MyChart before the doctors have had a chance to review them. We understand that in some cases there may be results that are confusing or concerning to you. Please understand that not all results are received at the same time and often the doctors may need to interpret multiple results in order to provide you with the best plan of care or course of treatment. Therefore, we ask that you please give us  2 business days to thoroughly review all your results before contacting the office for clarification. Should we see a critical lab result, you will be contacted sooner.   If You Need Anything After Your Visit  If you have any questions or concerns for your doctor, please call our main line at [phone] and press option 4 to reach your doctor's medical assistant. If no one answers, please leave a voicemail as directed and we will return your call as soon as possible. Messages left after 4 pm will be answered the following business day.   You may also send us  a message via MyChart. We typically respond to MyChart messages within 1-2 business days.  For prescription refills, please ask your pharmacy  to contact our office. Our fax number is [phone].  If you have an urgent issue when the clinic is closed that cannot wait until the next business day, you can page your doctor at the number below.    Please note that while we do our best to be available for urgent issues outside of office hours, we are not available 24/7.   If you have an urgent issue and are unable to reach us , you may choose to seek medical care at your doctor's office, retail clinic, urgent care center, or emergency room.  If you have a medical emergency, please  immediately call 911 or go to the emergency department.  Pager Numbers  - Dr. Hester: [phone]  - Dr. Jackquline: [phone]  - Dr. Claudene: [phone]   - Dr. Raymund: [phone]  In the event of inclement weather, please call our main line at [phone] for an update on the status of any delays or closures.  Dermatology Medication Tips: Please keep the boxes that topical medications come in in order to help keep track of the instructions about where and how to use these. Pharmacies typically print the medication instructions only on the boxes and not directly on the medication tubes.   If your medication is too expensive, please contact our office at [phone] option 4 or send us  a message through MyChart.   We are unable to tell what your co-pay for medications will be in advance as this is different depending on your insurance coverage. However, we may be able to find a substitute medication at lower cost or fill out paperwork to get insurance to cover a needed medication.   If a prior authorization is required to get your medication covered by your insurance company, please allow us  1-2 business days to complete this process.  Drug prices often vary depending on where the prescription is filled and some pharmacies may offer cheaper prices.  The website www.goodrx.com contains coupons for medications through different pharmacies. The prices here do not account for what the cost may be with help from insurance (it may be cheaper with your insurance), but the website can give you the price if you did not use any insurance.  - You can print the associated coupon and take it with your prescription to the pharmacy.  - You may also stop by our office during regular business hours and pick up a GoodRx coupon card.  - If you need your prescription sent electronically to a different pharmacy, notify our office through [facility] or by phone at [phone] option  4.     Si Usted Necesita Algo Despus de Su Visita  Tambin puede enviarnos un mensaje a travs de Clinical cytogeneticist. Por lo general respondemos a los mensajes de MyChart en el transcurso de 1 a 2 das hbiles.  Para renovar recetas, por favor pida a su farmacia que se ponga en contacto con nuestra oficina. Randi lakes de fax es [place] [phone].  Si tiene un asunto urgente cuando la clnica est cerrada y que no puede esperar hasta el siguiente da hbil, puede llamar/localizar a su doctor(a) al nmero que aparece a continuacin.   Por favor, tenga en cuenta que aunque hacemos todo lo posible para estar disponibles para asuntos urgentes fuera del horario de [place], no estamos disponibles las 24 horas del da, los 7 [address]  [address] de la [place].   Si tiene un problema urgente y no puede comunicarse con nosotros, puede optar por buscar atencin mdica  en el  consultorio de su doctor(a), en una clnica privada, en un centro de atencin urgente o en una sala de emergencias.  Si tiene Engineer, drilling, por favor llame inmediatamente al 911 o vaya a la sala de emergencias.  Nmeros de bper  - Dr. Hester: [phone]  - Dra. Jackquline: [phone]  - Dr. Claudene: [phone]  - Dra. Kitts: [phone]  En caso de inclemencias del [place], por favor llame a nuestra lnea principal al [phone] para una actualizacin sobre el estado de cualquier retraso o cierre.  Consejos para la medicacin en dermatologa: Por favor, guarde las cajas en las que vienen los medicamentos de uso tpico para ayudarle a seguir las instrucciones sobre dnde y cmo usarlos. Las farmacias generalmente imprimen las instrucciones del medicamento slo en las cajas y no directamente en los tubos del [place].   Si su medicamento es muy caro, por favor, pngase en contacto con landry rieger llamando al [phone] y presione la opcin 4 o envenos un mensaje a travs de Clinical cytogeneticist.   No podemos decirle cul ser su copago  por los medicamentos por adelantado ya que esto es diferente dependiendo de la cobertura de su seguro. Sin embargo, es posible que podamos encontrar un medicamento sustituto a Audiological scientist un formulario para que el seguro cubra el medicamento que se considera necesario.   Si se requiere una autorizacin previa para que su compaa de seguros malta su medicamento, por favor permtanos de 1 a 2 das hbiles para completar este proceso.  Los precios de los medicamentos varan con frecuencia dependiendo del Environmental consultant de dnde se surte la receta y alguna farmacias pueden ofrecer precios ms baratos.  El sitio web www.goodrx.com tiene cupones para medicamentos de Health and safety inspector. Los precios aqu no tienen en cuenta lo que podra costar con la ayuda del seguro (puede ser ms barato con su seguro), pero el sitio web puede darle el precio si no utiliz Tourist information centre manager.  - Puede imprimir el cupn correspondiente y llevarlo con su receta a la farmacia.  - Tambin puede pasar por nuestra oficina durante el horario de atencin regular y Education officer, museum una tarjeta de cupones de GoodRx.  - Si necesita que su receta se enve electrnicamente a una farmacia diferente, informe a nuestra oficina a travs de MyChart de [place] o por telfono llamando al [phone] y presione la opcin 4.

## 2024-03-29 ENCOUNTER — Ambulatory Visit: Payer: Self-pay | Admitting: Internal Medicine

## 2024-03-29 ENCOUNTER — Encounter: Payer: Self-pay | Admitting: Internal Medicine

## 2024-03-29 DIAGNOSIS — E538 Deficiency of other specified B group vitamins: Secondary | ICD-10-CM

## 2024-03-29 NOTE — Assessment & Plan Note (Signed)
 Recurrent.  No prior IF Ab check.  Parenteral supplementation needed and IF ab needed

## 2024-04-03 ENCOUNTER — Other Ambulatory Visit: Payer: Self-pay

## 2024-04-03 ENCOUNTER — Ambulatory Visit: Admitting: Anesthesiology

## 2024-04-03 ENCOUNTER — Encounter: Admission: RE | Disposition: A | Discharge status: Home / Self Care | Payer: Self-pay | Source: Home / Self Care

## 2024-04-03 ENCOUNTER — Ambulatory Visit: Admission: RE | Admit: 2024-04-03 | Discharge: 2024-04-03 | Disposition: A | Source: Home / Self Care

## 2024-04-03 DIAGNOSIS — Z860100 Personal history of colon polyps, unspecified: Secondary | ICD-10-CM

## 2024-04-03 HISTORY — DX: Gastro-esophageal reflux disease without esophagitis: K21.9

## 2024-04-03 MED ORDER — PROPOFOL 500 MG/50ML IV EMUL
INTRAVENOUS | Status: DC | PRN
  Administered 2024-04-03: 150 ug/kg/min via INTRAVENOUS
  Administered 2024-04-03: 100 mg via INTRAVENOUS

## 2024-04-03 MED ORDER — LIDOCAINE HCL (PF) 2 % IJ SOLN
INTRAMUSCULAR | Status: AC
  Filled 2024-04-03: qty 5

## 2024-04-03 MED ORDER — SODIUM CHLORIDE 0.9 % IV SOLN: INTRAVENOUS | Status: DC

## 2024-04-03 MED ORDER — PROPOFOL 1000 MG/100ML IV EMUL
INTRAVENOUS | Status: AC
  Filled 2024-04-03: qty 200

## 2024-04-03 NOTE — Op Note (Signed)
 [facility]astroenterology Patient Name: Samuel Riley Procedure Date: 04/03/2024 7:18 AM MRN: [id number] Account #: [id number] Date of Birth: 04-11-1970 Admit Type: Outpatient Age: 54 Room: [facility] ENDO ROOM 4 Gender: Male Note Status: Finalized Instrument Name: Colon Scope [id number] Procedure:             Colonoscopy Indications:           High risk colon cancer surveillance: Personal history                         of colonic polyps Providers:             Aloysius Eric Nap, MD Referring MD:          Rogelia Copping MD, MD (Referring MD), Verneita Kettering, MD                         (Referring MD) Medicines:             Monitored Anesthesia Care Complications:         No immediate complications. Procedure:             Pre-Anesthesia Assessment:                        - Prior to the procedure, a History and Physical was                         performed, and patient medications and allergies were                         reviewed. The patient is competent. The risks and                         benefits of the procedure and the sedation options and                         risks were discussed with the patient. All questions                         were answered and informed consent was obtained.                         Patient identification and proposed procedure were                         verified by the physician in the pre-procedure area.                         Mental Status Examination: alert and oriented. Airway                         Examination: normal oropharyngeal airway and neck                         mobility. CV Examination: normal. ASA Grade                         Assessment: II - A patient with mild systemic disease.  After reviewing the risks and benefits, the patient                         was deemed in satisfactory condition to undergo the                         procedure. The anesthesia plan was to use general                          anesthesia. Immediately prior to administration of                         medications, the patient was re-assessed for adequacy                         to receive sedatives. The heart rate, respiratory                         rate, oxygen saturations, blood pressure, adequacy of                         pulmonary ventilation, and response to care were                         monitored throughout the procedure. The physical                         status of the patient was re-assessed after the                         procedure.                        After obtaining informed consent, the colonoscope was                         passed under direct vision. Throughout the procedure,                         the patient's blood pressure, pulse, and oxygen                         saturations were monitored continuously. The                         Colonoscope was introduced through the anus and                         advanced to the the cecum, identified by appendiceal                         orifice and ileocecal valve. The colonoscopy was                         performed without difficulty. The patient tolerated                         the procedure well. The quality of the bowel  preparation was excellent. The ileocecal valve,                         appendiceal orifice, and rectum were photographed. Findings:      A 2 mm polyp was found in the proximal ascending colon. The polyp was       sessile. The polyp was removed with a cold snare. Resection and       retrieval were complete. Verification of patient identification for the       specimen was done. Estimated blood loss was minimal.      Multiple small-mouthed diverticula were found in the sigmoid colon.      Internal hemorrhoids were found during retroflexion. The hemorrhoids       were Grade II (internal hemorrhoids that prolapse but reduce       spontaneously). Impression:            - One 2 mm  polyp in the proximal ascending colon,                         removed with a cold snare. Resected and retrieved.                        - Diverticulosis in the sigmoid colon.                        - Internal hemorrhoids. Recommendation:        - Discharge patient to home.                        - Resume regular diet. Procedure Code(s):     --- Professional ---                        [phone], Colonoscopy, flexible; with removal of                         tumor(s), polyp(s), or other lesion(s) by snare                         technique Diagnosis Code(s):     --- Professional ---                        Z86.010, Personal history of colonic polyps                        D12.2, Benign neoplasm of ascending colon                        K64.1, Second degree hemorrhoids                        K57.30, Diverticulosis of large intestine without                         perforation or abscess without bleeding CPT copyright 2022 [employer]. All rights reserved. The codes documented in this report are preliminary and upon coder review may  be revised to meet current compliance requirements. Aloysius Eric Nap, [place] 04/03/2024 7:55:47 AM This report has been signed electronically. Number of Addenda: 0 Note Initiated On: 04/03/2024 7:18 AM  Scope Withdrawal Time: 0 hours 8 minutes 20 seconds  Total Procedure Duration: 0 hours 11 minutes 34 seconds  Estimated Blood Loss:  Estimated blood loss: none.      [facility]

## 2024-04-03 NOTE — Transfer of Care (Signed)
 Immediate Anesthesia Transfer of Care Note  Patient: Samuel Riley  Procedure(s) Performed: COLONOSCOPY COLONOSCOPY, WITH POLYPECTOMY  Patient Location: Endoscopy Unit  Anesthesia Type:General  Level of Consciousness: awake  Airway & Oxygen Therapy: Patient Spontanous Breathing  Post-op Assessment: Report given to RN and Post -op Vital signs reviewed and stable  Post vital signs: Reviewed and stable  Last Vitals:  Vitals Value Taken Time  BP 117/80 04/03/24 07:59  Temp    Pulse 66 04/03/24 08:00  Resp 24 04/03/24 08:00  SpO2 99 % 04/03/24 08:00  Vitals shown include unfiled device data.  Last Pain:  Vitals:   04/03/24 0759  TempSrc:   PainSc: 0-No pain         Complications: There were no known notable events for this encounter.

## 2024-04-03 NOTE — Anesthesia Postprocedure Evaluation (Signed)
"   Anesthesia Post Note  Patient: Samuel Riley  Procedure(s) Performed: COLONOSCOPY COLONOSCOPY, WITH POLYPECTOMY  Patient location during evaluation: PACU Anesthesia Type: General Level of consciousness: awake Pain management: satisfactory to patient Vital Signs Assessment: post-procedure vital signs reviewed and stable Respiratory status: spontaneous breathing Cardiovascular status: stable Anesthetic complications: no   There were no known notable events for this encounter.   Last Vitals:  Vitals:   04/03/24 0759 04/03/24 0809  BP: 117/80 (!) 129/92  Pulse: 63   Resp: 18 18  Temp:    SpO2: 99% 99%    Last Pain:  Vitals:   04/03/24 0759  TempSrc:   PainSc: 0-No pain                 VAN STAVEREN,Starlet Gallentine      "
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
## 2024-04-06 ENCOUNTER — Ambulatory Visit

## 2024-09-29 ENCOUNTER — Ambulatory Visit: Admitting: Dermatology
